# Patient Record
Sex: Male | Born: 1945 | Race: White | Hispanic: No | Smoking: Former smoker
Health system: Southern US, Community
[De-identification: ages and names within clinical notes are randomized; demographics above are authoritative.]

## PROBLEM LIST (undated history)

## (undated) DIAGNOSIS — F32A Depression, unspecified: Secondary | ICD-10-CM

## (undated) DIAGNOSIS — E785 Hyperlipidemia, unspecified: Secondary | ICD-10-CM

## (undated) DIAGNOSIS — K219 Gastro-esophageal reflux disease without esophagitis: Secondary | ICD-10-CM

## (undated) DIAGNOSIS — I10 Essential (primary) hypertension: Secondary | ICD-10-CM

## (undated) DIAGNOSIS — T7840XA Allergy, unspecified, initial encounter: Secondary | ICD-10-CM

## (undated) DIAGNOSIS — I2510 Atherosclerotic heart disease of native coronary artery without angina pectoris: Secondary | ICD-10-CM

## (undated) DIAGNOSIS — F419 Anxiety disorder, unspecified: Secondary | ICD-10-CM

## (undated) DIAGNOSIS — Z87438 Personal history of other diseases of male genital organs: Secondary | ICD-10-CM

## (undated) DIAGNOSIS — I219 Acute myocardial infarction, unspecified: Secondary | ICD-10-CM

## (undated) DIAGNOSIS — F329 Major depressive disorder, single episode, unspecified: Secondary | ICD-10-CM

## (undated) DIAGNOSIS — B356 Tinea cruris: Secondary | ICD-10-CM

## (undated) DIAGNOSIS — R609 Edema, unspecified: Secondary | ICD-10-CM

## (undated) HISTORY — DX: Edema, unspecified: R60.9

## (undated) HISTORY — DX: Atherosclerotic heart disease of native coronary artery without angina pectoris: I25.10

## (undated) HISTORY — PX: TONSILLECTOMY AND ADENOIDECTOMY: SUR1326

## (undated) HISTORY — DX: Depression, unspecified: F32.A

## (undated) HISTORY — DX: Gastro-esophageal reflux disease without esophagitis: K21.9

## (undated) HISTORY — DX: Allergy, unspecified, initial encounter: T78.40XA

## (undated) HISTORY — DX: Hyperlipidemia, unspecified: E78.5

## (undated) HISTORY — PX: CORONARY ANGIOPLASTY WITH STENT PLACEMENT: SHX49

## (undated) HISTORY — DX: Essential (primary) hypertension: I10

## (undated) HISTORY — DX: Tinea cruris: B35.6

## (undated) HISTORY — DX: Personal history of other diseases of male genital organs: Z87.438

## (undated) HISTORY — DX: Major depressive disorder, single episode, unspecified: F32.9

## (undated) HISTORY — DX: Anxiety disorder, unspecified: F41.9

---

## 2001-04-07 ENCOUNTER — Other Ambulatory Visit (HOSPITAL_COMMUNITY): Admission: RE | Admit: 2001-04-07 | Discharge: 2001-04-19 | Payer: Self-pay | Admitting: Psychiatry

## 2004-04-14 ENCOUNTER — Ambulatory Visit: Payer: Self-pay | Admitting: Psychiatry

## 2004-04-14 ENCOUNTER — Other Ambulatory Visit (HOSPITAL_COMMUNITY): Admission: RE | Admit: 2004-04-14 | Discharge: 2004-04-22 | Payer: Self-pay | Admitting: Psychiatry

## 2004-05-29 ENCOUNTER — Ambulatory Visit: Payer: Self-pay | Admitting: Internal Medicine

## 2004-10-21 ENCOUNTER — Ambulatory Visit: Payer: Self-pay | Admitting: Internal Medicine

## 2005-01-23 ENCOUNTER — Ambulatory Visit: Payer: Self-pay | Admitting: Internal Medicine

## 2005-05-06 ENCOUNTER — Inpatient Hospital Stay (HOSPITAL_COMMUNITY): Admission: EM | Admit: 2005-05-06 | Discharge: 2005-05-08 | Payer: Self-pay | Admitting: Emergency Medicine

## 2005-05-07 ENCOUNTER — Ambulatory Visit: Payer: Self-pay | Admitting: Internal Medicine

## 2005-05-07 HISTORY — PX: CARDIAC CATHETERIZATION: SHX172

## 2005-06-02 ENCOUNTER — Ambulatory Visit: Payer: Self-pay | Admitting: Internal Medicine

## 2005-06-04 ENCOUNTER — Ambulatory Visit: Payer: Self-pay | Admitting: Internal Medicine

## 2005-06-11 ENCOUNTER — Ambulatory Visit: Payer: Self-pay | Admitting: Internal Medicine

## 2005-06-16 ENCOUNTER — Ambulatory Visit: Payer: Self-pay | Admitting: Internal Medicine

## 2005-06-18 ENCOUNTER — Ambulatory Visit: Payer: Self-pay | Admitting: Cardiology

## 2005-06-19 ENCOUNTER — Ambulatory Visit: Payer: Self-pay | Admitting: Internal Medicine

## 2006-02-22 ENCOUNTER — Ambulatory Visit: Payer: Self-pay | Admitting: Internal Medicine

## 2006-02-24 ENCOUNTER — Ambulatory Visit: Payer: Self-pay | Admitting: Internal Medicine

## 2006-10-19 ENCOUNTER — Ambulatory Visit: Payer: Self-pay | Admitting: Internal Medicine

## 2006-10-19 LAB — CONVERTED CEMR LAB
ALT: 28 units/L (ref 0–40)
AST: 28 units/L (ref 0–37)
Albumin: 3.7 g/dL (ref 3.5–5.2)
Alkaline Phosphatase: 52 units/L (ref 39–117)
Bilirubin, Direct: 0.1 mg/dL (ref 0.0–0.3)
Cholesterol: 167 mg/dL (ref 0–200)
HDL: 52.9 mg/dL (ref >39.0)
LDL Cholesterol: 85 mg/dL (ref 0–99)
Total Bilirubin: 0.7 mg/dL (ref 0.3–1.2)
Total CHOL/HDL Ratio: 3.2
Total Protein: 6.6 g/dL (ref 6.0–8.3)
Triglycerides: 148 mg/dL (ref 0–149)
VLDL: 30 mg/dL (ref 0–40)

## 2006-11-09 ENCOUNTER — Ambulatory Visit: Payer: Self-pay | Admitting: Internal Medicine

## 2007-03-29 ENCOUNTER — Encounter: Payer: Self-pay | Admitting: *Deleted

## 2007-03-29 DIAGNOSIS — T7840XA Allergy, unspecified, initial encounter: Secondary | ICD-10-CM | POA: Insufficient documentation

## 2007-03-29 DIAGNOSIS — I10 Essential (primary) hypertension: Secondary | ICD-10-CM | POA: Insufficient documentation

## 2007-03-29 DIAGNOSIS — F341 Dysthymic disorder: Secondary | ICD-10-CM | POA: Insufficient documentation

## 2007-03-29 DIAGNOSIS — Z87448 Personal history of other diseases of urinary system: Secondary | ICD-10-CM

## 2007-03-29 DIAGNOSIS — K219 Gastro-esophageal reflux disease without esophagitis: Secondary | ICD-10-CM | POA: Insufficient documentation

## 2007-03-29 DIAGNOSIS — Z9089 Acquired absence of other organs: Secondary | ICD-10-CM

## 2007-03-29 DIAGNOSIS — E785 Hyperlipidemia, unspecified: Secondary | ICD-10-CM

## 2007-05-25 ENCOUNTER — Telehealth: Payer: Self-pay | Admitting: Internal Medicine

## 2007-08-31 ENCOUNTER — Telehealth: Payer: Self-pay | Admitting: Internal Medicine

## 2007-10-03 ENCOUNTER — Encounter: Payer: Self-pay | Admitting: Internal Medicine

## 2007-10-06 ENCOUNTER — Ambulatory Visit: Payer: Self-pay | Admitting: Internal Medicine

## 2007-10-06 DIAGNOSIS — I2510 Atherosclerotic heart disease of native coronary artery without angina pectoris: Secondary | ICD-10-CM

## 2007-10-06 DIAGNOSIS — Z9861 Coronary angioplasty status: Secondary | ICD-10-CM

## 2007-12-05 ENCOUNTER — Telehealth: Payer: Self-pay | Admitting: Internal Medicine

## 2007-12-27 ENCOUNTER — Encounter: Payer: Self-pay | Admitting: Internal Medicine

## 2007-12-28 ENCOUNTER — Ambulatory Visit: Payer: Self-pay | Admitting: Internal Medicine

## 2007-12-28 DIAGNOSIS — B356 Tinea cruris: Secondary | ICD-10-CM | POA: Insufficient documentation

## 2008-08-10 ENCOUNTER — Ambulatory Visit: Payer: Self-pay | Admitting: Endocrinology

## 2008-08-10 DIAGNOSIS — R609 Edema, unspecified: Secondary | ICD-10-CM

## 2008-08-13 LAB — CONVERTED CEMR LAB
BUN: 11 mg/dL (ref 6–23)
CO2: 27 meq/L (ref 19–32)
Calcium: 9.1 mg/dL (ref 8.4–10.5)
Chloride: 100 meq/L (ref 96–112)
Creatinine, Ser: 1 mg/dL (ref 0.4–1.5)
GFR calc Af Amer: 98 mL/min
GFR calc non Af Amer: 81 mL/min
Glucose, Bld: 106 mg/dL — ABNORMAL HIGH (ref 70–99)
Potassium: 3.9 meq/L (ref 3.5–5.1)
Pro B Natriuretic peptide (BNP): 13 pg/mL (ref 0.0–100.0)
Sodium: 135 meq/L (ref 135–145)
TSH: 2.48 microintl units/mL (ref 0.35–5.50)

## 2008-08-16 ENCOUNTER — Telehealth: Payer: Self-pay | Admitting: Internal Medicine

## 2008-09-04 ENCOUNTER — Ambulatory Visit: Payer: Self-pay | Admitting: Internal Medicine

## 2008-09-04 LAB — CONVERTED CEMR LAB
ALT: 20 units/L (ref 0–53)
AST: 19 units/L (ref 0–37)
Albumin: 3.7 g/dL (ref 3.5–5.2)
Alkaline Phosphatase: 51 units/L (ref 39–117)
BUN: 13 mg/dL (ref 6–23)
Basophils Absolute: 0 10*3/uL (ref 0.0–0.1)
Basophils Relative: 0.4 % (ref 0.0–3.0)
Bilirubin Urine: NEGATIVE
Bilirubin, Direct: 0.1 mg/dL (ref 0.0–0.3)
CO2: 29 meq/L (ref 19–32)
Calcium: 9 mg/dL (ref 8.4–10.5)
Chloride: 99 meq/L (ref 96–112)
Cholesterol: 238 mg/dL (ref 0–200)
Creatinine, Ser: 1 mg/dL (ref 0.4–1.5)
Direct LDL: 147.1 mg/dL
Eosinophils Absolute: 0 10*3/uL (ref 0.0–0.7)
Eosinophils Relative: 0 % (ref 0.0–5.0)
GFR calc Af Amer: 98 mL/min
GFR calc non Af Amer: 81 mL/min
Glucose, Bld: 112 mg/dL — ABNORMAL HIGH (ref 70–99)
HCT: 37.2 % — ABNORMAL LOW (ref 39.0–52.0)
HDL: 48.6 mg/dL (ref >39.0)
Hemoglobin, Urine: NEGATIVE
Hemoglobin: 12.9 g/dL — ABNORMAL LOW (ref 13.0–17.0)
Ketones, ur: NEGATIVE mg/dL
Leukocytes, UA: NEGATIVE
Lymphocytes Relative: 20.5 % (ref 12.0–46.0)
MCHC: 34.6 g/dL (ref 30.0–36.0)
MCV: 90.6 fL (ref 78.0–100.0)
Monocytes Absolute: 0.4 10*3/uL (ref 0.1–1.0)
Monocytes Relative: 9.7 % (ref 3.0–12.0)
Neutro Abs: 2.9 10*3/uL (ref 1.4–7.7)
Neutrophils Relative %: 69.4 % (ref 43.0–77.0)
Nitrite: NEGATIVE
PSA: 1.42 ng/mL (ref 0.10–4.00)
Platelets: 198 10*3/uL (ref 150–400)
Potassium: 3.8 meq/L (ref 3.5–5.1)
RBC: 4.11 M/uL — ABNORMAL LOW (ref 4.22–5.81)
RDW: 13.3 % (ref 11.5–14.6)
Sodium: 137 meq/L (ref 135–145)
Specific Gravity, Urine: 1.015 (ref 1.000–1.035)
TSH: 1.89 microintl units/mL (ref 0.35–5.50)
Total Bilirubin: 1 mg/dL (ref 0.3–1.2)
Total CHOL/HDL Ratio: 4.9
Total Protein, Urine: NEGATIVE mg/dL
Total Protein: 6.3 g/dL (ref 6.0–8.3)
Triglycerides: 249 mg/dL (ref 0–149)
Urine Glucose: NEGATIVE mg/dL
Urobilinogen, UA: 0.2 (ref 0.0–1.0)
VLDL: 50 mg/dL — ABNORMAL HIGH (ref 0–40)
WBC: 4.1 10*3/uL — ABNORMAL LOW (ref 4.5–10.5)
pH: 6 (ref 5.0–8.0)

## 2008-09-08 ENCOUNTER — Encounter: Payer: Self-pay | Admitting: Internal Medicine

## 2008-09-27 ENCOUNTER — Telehealth: Payer: Self-pay | Admitting: Internal Medicine

## 2008-11-30 ENCOUNTER — Encounter: Payer: Self-pay | Admitting: Internal Medicine

## 2009-01-04 ENCOUNTER — Telehealth (INDEPENDENT_AMBULATORY_CARE_PROVIDER_SITE_OTHER): Payer: Self-pay | Admitting: *Deleted

## 2009-01-10 ENCOUNTER — Encounter: Payer: Self-pay | Admitting: Internal Medicine

## 2009-01-11 ENCOUNTER — Encounter: Payer: Self-pay | Admitting: Internal Medicine

## 2009-03-21 ENCOUNTER — Telehealth: Payer: Self-pay | Admitting: Internal Medicine

## 2009-07-24 ENCOUNTER — Telehealth (INDEPENDENT_AMBULATORY_CARE_PROVIDER_SITE_OTHER): Payer: Self-pay | Admitting: *Deleted

## 2009-08-15 ENCOUNTER — Telehealth: Payer: Self-pay | Admitting: Internal Medicine

## 2009-09-26 ENCOUNTER — Ambulatory Visit: Payer: Self-pay | Admitting: Internal Medicine

## 2009-09-26 LAB — CONVERTED CEMR LAB
ALT: 41 units/L (ref 0–53)
AST: 28 units/L (ref 0–37)
Albumin: 3.6 g/dL (ref 3.5–5.2)
Alkaline Phosphatase: 49 units/L (ref 39–117)
BUN: 7 mg/dL (ref 6–23)
Basophils Absolute: 0 10*3/uL (ref 0.0–0.1)
Basophils Relative: 0.5 % (ref 0.0–3.0)
Bilirubin Urine: NEGATIVE
Bilirubin, Direct: 0.1 mg/dL (ref 0.0–0.3)
CO2: 33 meq/L — ABNORMAL HIGH (ref 19–32)
Calcium: 8.9 mg/dL (ref 8.4–10.5)
Chloride: 100 meq/L (ref 96–112)
Cholesterol: 130 mg/dL (ref 0–200)
Creatinine, Ser: 0.9 mg/dL (ref 0.4–1.5)
Eosinophils Absolute: 0.1 10*3/uL (ref 0.0–0.7)
Eosinophils Relative: 1 % (ref 0.0–5.0)
GFR calc non Af Amer: 90.58 mL/min (ref >60)
Glucose, Bld: 94 mg/dL (ref 70–99)
HCT: 38.5 % — ABNORMAL LOW (ref 39.0–52.0)
HDL: 50.1 mg/dL (ref >39.00)
Hemoglobin, Urine: NEGATIVE
Hemoglobin: 12.7 g/dL — ABNORMAL LOW (ref 13.0–17.0)
Ketones, ur: NEGATIVE mg/dL
LDL Cholesterol: 53 mg/dL (ref 0–99)
Leukocytes, UA: NEGATIVE
Lymphocytes Relative: 20.7 % (ref 12.0–46.0)
Lymphs Abs: 1.1 10*3/uL (ref 0.7–4.0)
MCHC: 33 g/dL (ref 30.0–36.0)
MCV: 96.1 fL (ref 78.0–100.0)
Monocytes Absolute: 0.4 10*3/uL (ref 0.1–1.0)
Monocytes Relative: 6.6 % (ref 3.0–12.0)
Neutro Abs: 3.8 10*3/uL (ref 1.4–7.7)
Neutrophils Relative %: 71.2 % (ref 43.0–77.0)
Nitrite: NEGATIVE
PSA: 3.58 ng/mL (ref 0.10–4.00)
Platelets: 217 10*3/uL (ref 150.0–400.0)
Potassium: 4.3 meq/L (ref 3.5–5.1)
RBC: 4 M/uL — ABNORMAL LOW (ref 4.22–5.81)
RDW: 13.7 % (ref 11.5–14.6)
Sodium: 139 meq/L (ref 135–145)
Specific Gravity, Urine: 1.015 (ref 1.000–1.030)
TSH: 2.71 microintl units/mL (ref 0.35–5.50)
Total Bilirubin: 0.4 mg/dL (ref 0.3–1.2)
Total CHOL/HDL Ratio: 3
Total Protein, Urine: NEGATIVE mg/dL
Total Protein: 5.9 g/dL — ABNORMAL LOW (ref 6.0–8.3)
Triglycerides: 134 mg/dL (ref 0.0–149.0)
Urine Glucose: NEGATIVE mg/dL
Urobilinogen, UA: 0.2 (ref 0.0–1.0)
VLDL: 26.8 mg/dL (ref 0.0–40.0)
WBC: 5.4 10*3/uL (ref 4.5–10.5)
pH: 7 (ref 5.0–8.0)

## 2009-09-27 ENCOUNTER — Ambulatory Visit: Payer: Self-pay | Admitting: Internal Medicine

## 2010-02-26 ENCOUNTER — Telehealth (INDEPENDENT_AMBULATORY_CARE_PROVIDER_SITE_OTHER): Payer: Self-pay | Admitting: *Deleted

## 2010-02-26 ENCOUNTER — Encounter: Payer: Self-pay | Admitting: Internal Medicine

## 2010-07-31 NOTE — Assessment & Plan Note (Signed)
Summary: CPX-LB   Vital Signs:  Patient profile:   65 year old male Height:      65 inches Weight:      239 pounds BMI:     39.92 O2 Sat:      96 % on Room air Temp:     97.3 degrees F oral Pulse rate:   93 / minute BP sitting:   144 / 96  (left arm) Cuff size:   large  Vitals Entered By: Bill Salinas CMA (September 27, 2009 1:58 PM)  O2 Flow:  Room air CC: pt here for yearly / pt states he is due for an eye exam / ab   Primary Care Provider:  Matthias Bogus  CC:  pt here for yearly / pt states he is due for an eye exam / ab.  History of Present Illness: Patient presents for routine medical follow-up. He has been feeling well.   He has had a hard time psychologically: very stressed about employment; his ex-wife got a large portion of his pension with exacerbation by stock market fall made his financial status worse. He also feels that he did not do as well  re: negotiations of his settlement due to depression. He has been followed by Dr. Donell Beers who was treating with desipramine. He was started on abilify after desipramine level came back as low (February) in addition to his other psychotropic meds.  He has a problem with flushing, usually after alcohol consumption and it happens spontaneously. He has no associated symptoms with the flushing.  Peripheral edema - waxes and wanes. He is not wearing support hose. Reviewed, again, the mechanism of venous insufficiency.   Obesity - hasn't lost weight. Interested in Bariatric surgery - referred to informational session at [facility].     Current Medications (verified): 1)  Xanax Xr 3 Mg  Tb24 (Alprazolam) .... Take One Tablet Once Daily 2)  Desipramine Hcl 25 Mg Tabs (Desipramine Hcl) .... Take 3 Tablet By Mouth Once A Day 3)  Avodart 0.5 Mg  Caps (Dutasteride) .... Take One Tablet Once Daily 4)  Flomax 0.4 Mg  Cp24 (Tamsulosin Hcl) .Marland Kitchen.. 1 Two Times A Day 5)  Lamictal 100 Mg  Tabs (Lamotrigine) .... Take One Tablet Twice Daily 6)  Plavix 75 Mg  Tabs  (Clopidogrel Bisulfate) .... Take One Tablet Once Daily 7)  Xanax 0.5 Mg  Tabs (Alprazolam) .... Take If Needed 8)  Simvastatin 80 Mg Tabs (Simvastatin) .Marland Kitchen.. 1 By Mouth Qpm 9)  Nitroquick 0.4 Mg  Subl (Nitroglycerin) .... As Needed 10)  Levitra 20 Mg Tabs (Vardenafil Hcl) .... Take 1 Tablet By Mouth Once A Day 11)  Omeprazole 40 Mg Cpdr (Omeprazole) .Marland Kitchen.. 1 Qam / Stop Nexium 12)  Lisinopril 10 Mg Tabs (Lisinopril) .Marland Kitchen.. 1 By Mouth Once Daily 13)  Furosemide 20 Mg Tabs (Furosemide) .... Qd 14)  Atenolol 50 Mg Tabs (Atenolol) .Marland Kitchen.. 1 By Mouth Once Daily Bp and Rate Control 15)  Zetia 10 Mg Tabs (Ezetimibe) .Marland Kitchen.. 1 By Mouth Once Daily  Allergies (verified): 1)  ! 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) CORONARY ARTERY DISEASE (ICD-414.00) ADENOIDECTOMY, HX OF (ICD-V45.79) TONSILLECTOMY, HX OF (ICD-V45.79) GASTROESOPHAGEAL REFLUX DISEASE (ICD-530.81) HYPERLIPIDEMIA (ICD-272.4) ALLERGY (ICD-995.3) PROSTATITIS, HX OF (ICD-V13.09) BENIGN PROSTATIC HYPERTROPHY, HX OF (ICD-V13.8) HYPERTENSION (ICD-401.9) DEPRESSION/ANXIETY (ICD-300.4)  Coronary artery disease-cath '06, repeat '08 non-obstructive disease  Past Surgical History: Last updated: 03/29/2007 ADENOIDECTOMY, HX OF (ICD-V45.79)  TONSILLECTOMY, HX OF (ICD-V45.79)  Family History: Last updated: 26-Sep-2008 father - deceased @ 47: CAD/MI mother - deceased @ 28: CVA Neg- colon or prostate cancer, DM  Social History: Last updated: September 26, 2008 retired [employer]; remains professionaly active married 35 years - divorce-- in process (3/10), lives alone. Children - son  Risk Factors: Alcohol Use: 1 (12/28/2007) Exercise: no (12/28/2007)  Risk Factors: Smoking Status: quit (03/29/2007) Passive Smoke Exposure: no (12/28/2007)  Review of Systems       The patient complains of peripheral edema and  depression.  The patient denies anorexia, fever, weight loss, weight gain, vision loss, decreased hearing, chest pain, dyspnea on exertion, prolonged cough, headaches, abdominal pain, severe indigestion/heartburn, incontinence, muscle weakness, suspicious skin lesions, difficulty walking, unusual weight change, abnormal bleeding, and angioedema.    Physical Exam  General:  overweight white male in no distress Head:  Normocephalic and atraumatic without obvious abnormalities. No apparent alopecia or balding. Eyes:  No corneal or conjunctival inflammation noted. EOMI. Perrla. Funduscopic exam benign, without hemorrhages, exudates or papilledema. Vision grossly normal. Ears:  External ear exam shows no significant lesions or deformities.  Otoscopic examination reveals clear canals, tympanic membranes are intact bilaterally without bulging, retraction, inflammation or discharge. Hearing is grossly normal bilaterally. Mouth:  Oral mucosa and oropharynx without lesions or exudates.  Teeth in good repair. Neck:  supple, full ROM, no thyromegaly, and no carotid bruits.   Chest Wall:  no deformities and no tenderness.   Breasts:  No masses or gynecomastia noted Lungs:  Normal respiratory effort, chest expands symmetrically. Lungs are clear to auscultation, no crackles or wheezes. Heart:  Normal rate and regular rhythm. S1 and S2 normal without gallop, murmur, click, rub or other extra sounds. Abdomen:  soft, non-tender, normal bowel sounds, no guarding, no rigidity, no inguinal hernia, and no hepatomegaly.   Prostate:  deferred to GU Msk:  normal ROM, no joint tenderness, no joint swelling, no joint deformities, and no joint instability.   Extremities:  1+ pitting edema LE and general swelling. Neurologic:  alert & oriented X3, cranial nerves II-XII intact, strength normal in all extremities, gait normal, and DTRs symmetrical and normal.   Skin:  turgor normal, color normal, no rashes, no suspicious  lesions, and no petechiae.   Cervical Nodes:  no anterior cervical adenopathy and no posterior cervical adenopathy.   Psych:  Oriented X3, memory intact for recent and remote, normally interactive, good eye contact, and not anxious appearing.     Impression & Recommendations:  Problem # 1:  OBESITY, CLASS III (ICD-278.01) This is a major health risk for him. We discussed potential bariatric surgery.  Plan - He is advised to attend an informational class on bariatric surgery at [facility]. He would be medically able to undergo surgery  Problem # 2:  EDEMA (ICD-782.3) Reviewed the mechanism of venous insufficiency.  Plan - use of knee high support hose and elevation of legs during the day.   His updated medication list for this problem includes:    Furosemide 20 Mg Tabs (Furosemide) ..... Once daily for bp mgt  Problem # 3:  CORONARY ARTERY DISEASE (ICD-414.00) Stable with no complaints of angina or other cardiac symptoms. He did have a cardiac cath in [place] with no obstructive disease but no report is on record.  Plan - continued risk reduction through life-style and medical management  His updated medication list for this problem includes:    Plavix 75 Mg Tabs (Clopidogrel bisulfate) .Marland KitchenMarland KitchenMarland KitchenMarland Kitchen  Take one tablet once daily for circulation    Nitroquick 0.4 Mg Subl (Nitroglycerin) .Marland Kitchen... As needed    Lisinopril 10 Mg Tabs (Lisinopril) .Marland Kitchen... 1 by mouth once daily for bp    Furosemide 20 Mg Tabs (Furosemide) ..... Once daily for bp mgt    Atenolol 100 Mg Tabs (Atenolol) .Marland Kitchen... 1 by mouth once daily for bp mgt and heart rate control  Problem # 4:  HYPERLIPIDEMIA (ICD-272.4) Excellent control with LDL 53 on current regimen.  Plan - continue present medications.  His updated medication list for this problem includes:    Simvastatin 80 Mg Tabs (Simvastatin) .Marland Kitchen... 1 by mouth qpm for cholesterol mgt    Zetia 10 Mg Tabs (Ezetimibe) .Marland Kitchen... 1 by mouth once daily for cholesterol mgt.  Problem #  5:  GASTROESOPHAGEAL REFLUX DISEASE (ICD-530.81) Stable on omperazole.  His updated medication list for this problem includes:    Omeprazole 40 Mg Cpdr (Omeprazole) .Marland Kitchen... 1 qam / stop nexium for acid stomach  Problem # 6:  HYPERTENSION (ICD-401.9)  His updated medication list for this problem includes:    Lisinopril 10 Mg Tabs (Lisinopril) .Marland Kitchen... 1 by mouth once daily for bp    Furosemide 20 Mg Tabs (Furosemide) ..... Once daily for bp mgt    Atenolol 100 Mg Tabs (Atenolol) .Marland Kitchen... 1 by mouth once daily for bp mgt and heart rate control  BP today: 144/96 Prior BP: 106/74 (09/04/2008)  BP a bit elevated today but previously well controlled.  Plan - no change in medication           out of office monitoring with report back. Adjustments to be made based on information relayed back.   Problem # 7:  DEPRESSION/ANXIETY (ICD-300.4) Complex patient on many medications. He will follow-up with Dr. Donell Beers. By his report he doing well at this time on his present medical regimen.  Problem # 8:  Preventive Health Care (ICD-V70.0) Exam remarkable for peripheral edema and weight. No colonoscopy record on EMR - will check his paper chart and schedule if due. Lab report is unremarkable.   In summary- a nice gentleman who needs to get his obesity under control. He will investigate the possibilty of bariatric surgery - which is medically indicated given his metabolic syndrome with associated risk.   Complete Medication List: 1)  Xanax Xr 3 Mg Tb24 (Alprazolam) .... Take one tablet once daily 2)  Desipramine Hcl 25 Mg Tabs (Desipramine hcl) .... Take 3 tablet by mouth once a day 3)  Avodart 0.5 Mg Caps (Dutasteride) .... Take one tablet once daily 4)  Flomax 0.4 Mg Cp24 (Tamsulosin hcl) .Marland Kitchen.. 1 two times a day 5)  Lamictal 100 Mg Tabs (Lamotrigine) .... Take one tablet twice daily 6)  Plavix 75 Mg Tabs (Clopidogrel bisulfate) .... Take one tablet once daily for circulation 7)  Xanax 0.5 Mg Tabs  (Alprazolam) .... Take if needed 8)  Simvastatin 80 Mg Tabs (Simvastatin) .Marland Kitchen.. 1 by mouth qpm for cholesterol mgt 9)  Nitroquick 0.4 Mg Subl (Nitroglycerin) .... As needed 10)  Levitra 20 Mg Tabs (Vardenafil hcl) .... Take 1 tablet by mouth once a day for ed 11)  Omeprazole 40 Mg Cpdr (Omeprazole) .Marland Kitchen.. 1 qam / stop nexium for acid stomach 12)  Lisinopril 10 Mg Tabs (Lisinopril) .Marland Kitchen.. 1 by mouth once daily for bp 13)  Furosemide 20 Mg Tabs (Furosemide) .... Once daily for bp mgt 14)  Atenolol 100 Mg Tabs (Atenolol) .Marland Kitchen.. 1 by mouth once daily for bp  mgt and heart rate control 15)  Zetia 10 Mg Tabs (Ezetimibe) .Marland Kitchen.. 1 by mouth once daily for cholesterol mgt.  Other Orders: TD Toxoids IM 7 YR + ([address]) Admin 1st Vaccine (60454)  Patient: [name] [name] Note: All result statuses are Final unless otherwise noted.  Tests: (1) Lipid Panel (LIPID)   Cholesterol               130 mg/dL                   0-981     ATP III Classification            Desirable:  < 200 mg/dL                    Borderline High:  200 - 239 mg/dL               High:  > = 240 mg/dL   Triglycerides             134.0 mg/dL                 1.9-147.8     Normal:  <150 mg/dL     Borderline High:  295 - 199 mg/dL   HDL                       62.13 mg/dL                 >08.65   VLDL Cholesterol          26.8 mg/dL                  7.8-46.9   LDL Cholesterol           53 mg/dL                    6-29  CHO/HDL Ratio:  CHD Risk                             3                    Men          Women     1/2 Average Risk     3.4          3.3     Average Risk          5.0          4.4     2X Average Risk          9.6          7.1     3X Average Risk          15.0          11.0                           Tests: (2) BMP (METABOL)   Sodium                    139 mEq/L                   135-145   Potassium                 4.3 mEq/L  3.5-5.1   Chloride                  100 mEq/L                   96-112   Carbon Dioxide        [H]  33 mEq/L                    19-32   Glucose                   94 mg/dL                    16-10   BUN                       7 mg/dL                     9-60   Creatinine                0.9 mg/dL                   4.5-4.0   Calcium                   8.9 mg/dL                   9.8-11.9   GFR                       90.58 mL/min                >60  Tests: (3) CBC Platelet w/Diff (CBCD)   White Cell Count          5.4 K/uL                    4.5-10.5   Red Cell Count       [L]  4.00 Mil/uL                 4.22-5.81   Hemoglobin           [L]  12.7 g/dL                   14.7-82.9   Hematocrit           [L]  38.5 %                      39.0-52.0   MCV                       96.1 fl                     78.0-100.0   MCHC                      33.0 g/dL                   56.2-13.0   RDW                       13.7 %                      11.5-14.6   Platelet Count            217.0  K/uL                  150.0-400.0   Neutrophil %              71.2 %                      43.0-77.0   Lymphocyte %              20.7 %                      12.0-46.0   Monocyte %                6.6 %                       3.0-12.0   Eosinophils%              1.0 %                       0.0-5.0   Basophils %               0.5 %                       0.0-3.0   Neutrophill Absolute      3.8 K/uL                    1.4-7.7   Lymphocyte Absolute       1.1 K/uL                    0.7-4.0   Monocyte Absolute         0.4 K/uL                    0.1-1.0  Eosinophils, Absolute                             0.1 K/uL                    0.0-0.7   Basophils Absolute        0.0 K/uL                    0.0-0.1  Tests: (4) Hepatic/Liver Function Panel (HEPATIC)   Total Bilirubin           0.4 mg/dL                   1.6-1.0   Direct Bilirubin          0.1 mg/dL                   9.6-0.4   Alkaline Phosphatase      49 U/L                      39-117   AST                       28 U/L                      0-37   ALT                        41 U/L  0-53   Total Protein        [L]  5.9 g/dL                    1.6-1.0   Albumin                   3.6 g/dL                    9.6-0.4  Tests: (5) TSH (TSH)   FastTSH                   2.71 uIU/mL                 0.35-5.50  Tests: (6) UDip Only (UDIP)   Color                     YELLOW       RANGE:  Yellow;Lt. Yellow   Clarity                   CLEAR                       Clear   Specific Gravity          1.015                       1.000 - 1.030   Urine Ph                  7.0                         5.0-8.0   Protein                   NEGATIVE                    Negative   Urine Glucose             NEGATIVE                    Negative   Ketones                   NEGATIVE                    Negative   Urine Bilirubin           NEGATIVE                    Negative   Blood                     NEGATIVE                    Negative   Urobilinogen              0.2                         0.0 - 1.0   Leukocyte Esterace        NEGATIVE                    Negative   Nitrite                   NEGATIVE  Negative  Tests: (7) Prostate Specific Antigen (PSA)   PSA-Hyb                   3.58 ng/mL                  0.10-4.00Prescriptions: ZETIA 10 MG TABS (EZETIMIBE) 1 by mouth once daily for cholesterol mgt.  #30 x 12   Entered and Authorized by:   Jacques Navy MD   Signed by:   Jacques Navy MD on 09/27/2009   Method used:   Electronically to        [facility]  [phone]* (retail)       [address] [place], [place]  [address]       Ph: [phone] or [phone]       Fax: [phone]   RxID:   [id number] ATENOLOL 100 MG TABS (ATENOLOL) 1 by mouth once daily for BP mgt and heart rate control  #30 x 12   Entered and Authorized by:   Jacques Navy MD   Signed by:   Jacques Navy MD on 09/27/2009   Method used:   Electronically to        [facility]  [phone]* (retail)       [address] [place], [place]  [address]       Ph: [phone] or [phone]       Fax: [phone]   RxID:   [id number] FUROSEMIDE 20 MG TABS (FUROSEMIDE) once daily for BP mgt  #30 x 12   Entered and Authorized by:   Jacques Navy MD   Signed by:   Jacques Navy MD on 09/27/2009   Method used:   Electronically to        [facility]  [phone]* (retail)       [address] [place], [place]  [address]       Ph: [phone] or [phone]       Fax: [phone]   RxID:   [id number] LISINOPRIL 10 MG TABS (LISINOPRIL) 1 by mouth once daily for BP  #30 x 12   Entered and Authorized by:   Jacques Navy MD   Signed by:   Jacques Navy MD on 09/27/2009   Method used:   Electronically to        [facility]  [phone]* (retail)       [address] [place], [place]  [address]       Ph: [phone] or [phone]       Fax: [phone]   RxID:   [id number] OMEPRAZOLE 40 MG CPDR (OMEPRAZOLE) 1 qam / Stop Nexium for acid stomach  #30 x 12   Entered and Authorized by:   Jacques Navy MD   Signed by:   Jacques Navy MD on 09/27/2009   Method used:   Electronically to        [facility]  [phone]* (retail)       [address] [place], [place]  [address]       Ph: [phone] or [phone]       Fax: [phone]   RxID:   [id number] LEVITRA 20 MG TABS (VARDENAFIL HCL) Take 1 tablet by mouth once a day for ED  #6 x 12  Entered and Authorized by:   Jacques Navy MD   Signed by:   Jacques Navy MD on 09/27/2009   Method used:   Electronically to        [facility]  [phone]* (retail)       [address] [place], [place]  [address]       Ph: [phone] or [phone]       Fax: [phone]   RxID:   [id number] NITROQUICK 0.4 MG  SUBL (NITROGLYCERIN) as needed  #20 x 3   Entered and Authorized by:   Jacques Navy MD   Signed by:   Jacques Navy MD on 09/27/2009   Method used:   Electronically to         [facility]  [phone]* (retail)       [address] [place], [place]  [address]       Ph: [phone] or [phone]       Fax: [phone]   RxID:   [id number] SIMVASTATIN 80 MG TABS (SIMVASTATIN) 1 by mouth qpm for cholesterol mgt  #30 x 12   Entered and Authorized by:   Jacques Navy MD   Signed by:   Jacques Navy MD on 09/27/2009   Method used:   Electronically to        [facility]  [phone]* (retail)       [address] [place], [place]  [address]       Ph: [phone] or [phone]       Fax: [phone]   RxID:   [id number] PLAVIX 75 MG  TABS (CLOPIDOGREL BISULFATE) Take one tablet once daily for circulation  #30 x 12   Entered and Authorized by:   Jacques Navy MD   Signed by:   Jacques Navy MD on 09/27/2009   Method used:   Electronically to        [facility]  [phone]* (retail)       [address] [place], [place]  [address]       Ph: [phone] or [phone]       Fax: [phone]   RxID:   [id number]    Preventive Care Screening  Last Flu Shot:    Date:  03/30/2009    Results:  given     Immunizations Administered:  Tetanus Vaccine:    Vaccine Type: Td    Site: left deltoid    Mfr: Sanofi Pasteur    Dose: 0.5 ml    Route: IM    Given by: Ami Bullins CMA    Exp. Date: 05/14/2011    Lot #: [id number]    VIS given: 05/17/07 version given September 27, 2009.

## 2010-07-31 NOTE — Progress Notes (Signed)
Summary: Nabumetone  Phone Note Call from Patient Call back at [facility] Phone [phone]   Summary of Call: Pt took anti-inflamatory, nabumetone 500mg . Pharmacist told pt that it was safe to take with his other meds. He read info about medication and is worried that there may be some type of interaction. He wants Dr Debby Bud opinion.  Initial call taken by: Lamar Sprinkles, CMA,  August 15, 2009 5:04 PM  Follow-up for Phone Call        called pt - reviewed risks & benefits of NSAIDS. he is taking this for acute back pain. Ok for short-term use. Follow-up by: Jacques Navy MD,  August 15, 2009 6:05 PM

## 2010-07-31 NOTE — Progress Notes (Signed)
Summary: NEXIUM  Phone Note Call from Patient Call back at Home Phone [phone]   Summary of Call: Patient left message on triage that he needs prescription for generic of Nexium due to insurance, I am sure he means prescription for Omeprazole. If pt has taken Omeprazole, I can try and do PA for Nexium.  Patient also would like to have his choleserol testing done. Last CPX appears to have been done 08/2008.  Please advise. Initial call taken by: Lucious Groves,  July 24, 2009 1:06 PM  Follow-up for Phone Call        ok for omeprazole 40mg  by mouth qAM, #90, 3 refills. OK for lipid panel 272.4, hepatic 995.20, metabolic 401.9 Follow-up by: Jacques Navy MD,  July 24, 2009 5:25 PM  Additional Follow-up for Phone Call Additional follow up Details #1::        Pt informed, please put lab in idx, THANK YOU  labs in IDX.....Marland KitchenMarland KitchenVerdell Face Additional Follow-up by: Lamar Sprinkles, CMA,  July 25, 2009 6:03 PM    New/Updated Medications: OMEPRAZOLE 40 MG CPDR (OMEPRAZOLE) 1 qam / Stop Nexium Prescriptions: OMEPRAZOLE 40 MG CPDR (OMEPRAZOLE) 1 qam / Stop Nexium  #90 x 3   Entered by:   Lamar Sprinkles, CMA   Authorized by:   Jacques Navy MD   Signed by:   Lamar Sprinkles, CMA on 07/25/2009   Method used:   Electronically to        [facility]  [phone]* (retail)       [address] [place], [place]  [address]       Ph: [phone] or [phone]       Fax: [phone]   RxID:   [id number]

## 2010-07-31 NOTE — Medication Information (Signed)
Summary: Prior autho & approved for Omeprazole/[facility]  Prior autho & approved for Omeprazole/[facility]   Imported By: Sherian Rein 03/05/2010 15:20:47  _____________________________________________________________________  External Attachment:    Type:   Image     Comment:   External Document

## 2010-07-31 NOTE — Progress Notes (Signed)
Summary: PA-Omeprazole   Phone Note From Pharmacy   Summary of Call: PA-Omeprazole, contacted [facility] @ [phone] awaiting form. Dagoberto Reef  February 26, 2010 9:41 AM  PA-Omeprazole faxed to [facility], awaiting approval. Initial call taken by: Dagoberto Reef,  February 26, 2010 11:32 AM  Follow-up for Phone Call        PA-Omeprazole approved 02/05/10-02/26/11, case # [id number]. Follow-up by: Dagoberto Reef,  February 28, 2010 2:50 PM

## 2010-10-29 ENCOUNTER — Other Ambulatory Visit: Payer: Self-pay | Admitting: Internal Medicine

## 2010-11-14 NOTE — Assessment & Plan Note (Signed)
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  His last full physical exam was October 21, 2004.   INTERVAL HISTORY:  Patient was treated for peripheral edema in July 2006  which was thought to be due to venous stasis.  Of note, the patient  developed significant chest pain and was hospitalized for the same May 07, 2005.  He underwent cardiac catheterization for symptoms of unstable  angina.  Patient was found to have intermediate lesion of the proximal LAD.  The diagonal branch was actually larger and had a 40 to 50% proximal  stenosis.  He was brought back a second time to the cath lab for pressure  wire measurement of the LAD.  There was no gradient at rest and no  intervention was required.  The patient had an EF of about 65%.  Patient did  well with this.  Patient was subsequently seen for an incision and drainage  for a large sebaceous cyst at a T4 level of the left upper back.  This  procedure was carried out successfully but there was a wound infection to  follow which required Iodoform packing with eventual 100% resolution.  Patient last saw cardiology June 18, 2005 for followup at which point he  was thought to be stable.  His LDL goal at that time was to be 70 or less.   PAST MEDICAL HISTORY:  1. Usual childhood disease.  2. Zoster at age 65.  3. GERD.  4. Hyperlipidemia.  5. Prostatitis.  6. BPH.  7. Hypertension.  8. Depression with anxiety.  9. History of allergies.   SURGICAL:  Tonsillectomy and adenoidectomy.  No other surgeries.   MEDICATIONS:  1. Toprol XL 100 mg daily.  2. Xanax CR 3 mg q. day.  3. Azipramine 75 mg daily.  4. Avodart 0.5 mg q. day.  5. Flomax  0.4 mg two a bedtime.  6. Lamictal 100 mg b.i.d.  7. Torsemide 20 mg daily.  8. Plavix 75 mg daily.  9. Vytorin 10/40.  10.The patient was given a trial of UroXatral 10 mg daily.   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  The patient was  married and has 3 grown children.  He is presently separated.  He says he  developed an excellent new relationship.  This involves the purchasing of a  farm in [place] where he is actually becoming quite a bit more active  and has made significant dietary changes.  Patient was smoking 1/2 pack per  day and he was not asked if he had resumed his smoking. Patient uses alcohol  occasionally.  ALLERGIES:  Patient has no known drug allergies.   FAMILY HISTORY:  Significant for his father having MI at age 60.  Mother  died at age 68 of a stroke.   CHART REVIEW:  Cardiac cath as noted.   Patient saw Dr. Ihor [name] on December 18, 2005 and has had recent followup  for BPH with slowed stream.  Patient has had lower extremity arterial  Doppler performed September 29, 2002 which showed normal ABIs.   REVIEW OF SYSTEMS:  Patient has had no constitutional, cardiovascular,  respiratory, GI or GU problems.   EXAMINATION:  VITAL SIGNS:  Temperature was 96.7.  Blood pressure 140/87 on  the right.  131/89 on the left. Heart rate was 83.  Weight is 224.  Height 5  foot, 8 inches.  GENERAL:  General appearance a well-nourished, well-developed gentleman in  no acute distress.  HEENT:  Normocephalic.  Atraumatic.  EACs and TMs were unremarkable.  Oropharynx with native dentition in good repair.  No buccal or palatal  lesions were noted.  Posterior pharynx was clear.  Conjunctiva, sclerae was  clear.  PERRLA, EOMI, funduscopic exam was unremarkable  NECK:  Neck was supple without thyromegaly.  Nodes,  no  adenopathy was noted  in the cervical supraclavicular regions.  CHEST:  No CVA tenderness.  LUNGS:  Clear to auscultation and percussion.  CARDIOVASCULAR:  2+ radial pulse.  No JVD or carotid bruits.  He had a quiet  precordium with a regular rate and rhythm without murmurs, rubs or gallops.  ABDOMEN:  Soft.  No guarding or rebound.  He had no organosplenomegaly.  GENITALIA & RECTAL:  Exams referred to Dr. Vernie [name].  EXTREMITIES:  Without clubbing, cyanosis, edema or deformity.  NEUROLOGIC:  Exam was non-focal.   LABORATORY:  February 22, 2006 patient had a hemoglobin of 13.8 grams.  White  count was 8,100 with a normal differential.  Cholesterol was 205.  Triglycerides 91.  HDL was 58.6.  LDL was 136.8 and this was on Lipitor 40  mg daily.  Chemistries reveal a serum glucose of 110. Kidney function normal  with a creatinine 1.0 with an estimated GFR of 81 milliliters per minute.  Liver functions were normal.  Thyroid function normal with a TSH of 1.74.  PSA was normal at 0.85.  Urinalysis was negative.   ASSESSMENT/PLAN:  1. Hypertension.  Patient's blood pressure is borderline control at the      present time.  We will make no change in his medication but encouraged      the patient to continue on a regular exercise program.  2. Cardiovascular.  Patient is stable.  He has had no chest pain or chest      discomfort. He had cardiac catheterization as noted with no true      obstructive disease.  3. Lipid.  The patient's LDL is not at goal.  At this point we will go      from Lipitor 40 to Vytorin 10/40.  Prescription and samples are      provided.  Patient is to return in 4-6 weeks at his convenience for a      followup lipid panel and we will adjust his medications as needed to      obtain goal.  4. Genitourinary.  The patient's BPH is stable.  He is on Flomax as noted.      He was started on UroXatral recently.  The patient will followup with     Dr. Vernie [name]  as needed.  5.  Dermatology.  Patient is doing well at this time.  He has seen and does      see Dr. Terri [name] on an as needed basis.  6. Psychiatric.  Patient is followed by Dr. Archer [name].  On his      present regimen he seems to be stable and doing well.  He also has an      improved home situation as well as an improved professional situation.   SUMMARY:  This is a pleasant gentleman with medical problems as outlined  above.  He does seem medically stable at this time.  He will return for  laboratory as noted.  He will be informed by [facility] as to the result.                                   Rosalyn Gess Norins, MD   MEN/MedQ  DD:  02/26/2006  DT:  02/26/2006  Job #:  [id number]   cc:   Dr. [name]

## 2010-11-14 NOTE — Cardiovascular Report (Signed)
[name], ALDERMAN NO.:  [id number]   MEDICAL RECORD NO.:  [id number]          PATIENT TYPE:  INP   LOCATION:  6526                         FACILITY:  [facility]   PHYSICIAN:  Arvilla Meres, M.D. LHCDATE OF BIRTH:  [birth year]   DATE OF PROCEDURE:  05/07/2005  DATE OF DISCHARGE:                              CARDIAC CATHETERIZATION   PRIMARY CARE PHYSICIAN:  Dr. Rosalyn Gess. Norins.   CARDIOLOGIST:  Dr. Gala Romney.   PATIENT IDENTIFICATION:  [name] [name] is a very pleasant 65 year old male, with  multiple cardiac risk factors but no known coronary disease, who was  admitted through the emergency room with exertional left arm pain, shortness  of breath and diaphoresis. He ruled out for MI with serial cardiac markers.  He was brought to the cardiac catheterization lab for diagnostic coronary  angiography.   PROCEDURES PERFORMED:  1.  Selective coronary angiography.  2.  Left heart cath.  3.  Left ventriculogram.   DESCRIPTION OF PROCEDURE:  The risks, benefits of the procedure were  explained to Mr. [name] [name] and his son. A consent was signed and placed on the  chart. A 6-French arterial sheath was placed in the right femoral artery  using a modified Seldinger technique. Standard preformed Judkins catheters  including JL-4, JR-4 and angled pigtail were used for the procedure. All  catheter exchanges were made over wire. There were no apparent  complications.   Central aortic pressure is 106/72 with a mean of 88. LV pressure was 106/10  with an LVEDP of 17. There was no gradient on aortic valve pullback.   Left main was short, angiographically normal.   LAD was moderate-sized vessel. It gave rise to a single small diagonal and  two moderate to large septal branches. In the midportion of the LAD, after  the takeoff of the second diagonal, there was a 50-60% left ventricle lesion  which appeared to be perhaps slightly worse in the spider view. Following  this, there was  a 40-50% focal lesion and in the distal vessel which was  rather small there was multiple 30-40% lesions. There was 50% stenosis in  the second diagonal.   Left circumflex was a large vessel. It gave off a tiny OM1, large OM2, and a  large OM3. There was a 20% lesion in the proximal left circumflex, 30% the  mid. In the OM2, there was a 40% mid and in the OM3 there was a 30% mid.   The right coronary artery was a large dominant vessel. It gave off moderate-  sized PDA. There was a large what appeared to be an acute marginal branch  which also seemed to feed the PDA territory. There was a 40% lesion in the  acute marginal, but otherwise no significant coronary disease.   Left ventriculogram done the RAO approach showed an EF of 65% with no  significant wall motion abnormalities or mitral regurgitation.   ASSESSMENT:  1.  Moderate nonobstructive coronary disease with a borderline lesion in the      mid-left  anterior descending artery.  2.  Normal  left ventricular function.   PLAN/DISCUSSION:  I have reviewed the films with Dr. Juanda Chance. Given that the  lesion in the LAD appears borderline significant, we will plan for IVUS or a  pressure wire to evaluate definitively. He will also need aggressive risk  factor management.      Arvilla Meres, M.D. [facility]  Electronically Signed     DB/MEDQ  D:  05/07/2005  T:  05/07/2005  Job:  [id number]   cc:   Rosalyn Gess. Norins, M.D. [facility]  [address]  [place]  [place] [address]

## 2010-11-14 NOTE — H&P (Signed)
NAMETERRYN, REDNER NO.:  [id number]   MEDICAL RECORD NO.:  [id number]          PATIENT TYPE:  INP   LOCATION:  4730                         FACILITY:  [facility]   PHYSICIAN:  Arvilla Meres, M.D. LHCDATE OF BIRTH:  July 24, 1945   DATE OF ADMISSION:  05/06/2005  DATE OF DISCHARGE:                                HISTORY & PHYSICAL   PRIMARY CARE PHYSICIAN:  Rosalyn Gess. Norins, M.D.  He is new to [facility]  [facility]iology.   REASON FOR ADMISSION:  Exertional left arm pain and dyspnea, likely unstable  angina.   HISTORY OF PRESENT ILLNESS:  Mr. Dumond is a delightful 65 year old male with  multiple cardiac risk factors including hypertension, hyperlipidemia,  obesity, and a strong family history of CAD. He denies any previous history  of known coronary disease. He has not had a cardiac catheterization. He did  have Cardiolite done in 1999 for an abnormal EKG what I presumed was his  right bundle branch block which showed no evidence of ischemia.   This afternoon, beginning about 1:30, he developed pain and heaviness in his  left arm. This got worse with exertion. He walked upstairs to lie down and  while walking the step the discomfort in his arm got much worse and he got  markedly short of breath. He was also diaphoretic. There was no associated  chest pain. On lying down, the pain resolved. He called his son who is  critical care nurse here in the ICU and was told to come to the emergency  room. While in the emergency room, he is pain-free. In talking with him, he  notes that he has had several episodes of exertional arm pain over the past  few weeks or months but they have been minor and none as severe as today. He  notes that he also had a recent trip to [place] about two weeks ago but  denies any worsening of his mild chronic lower extremity edema.   In the ER, his EKG shows a right bundle branch block at a rate of 90. There  are no ST or T-wave changes. His first set  of point of care markers are  negative.   REVIEW OF SYSTEMS:  Positive for heavy snoring and possible apneic episodes.  He also has arthritic pain. He has chronic lower extremity edema related to  the venous insufficiency. He also has significant problems with his prostate  which has been done quite well with medical therapy. He has also had an  extensive history of depression but this is well controlled recently. He  denies any recent fevers, chills, nausea, vomiting, bright red blood per  rectum, or melena. There have been no neurologic symptoms. The rest of the  review of systems is negative except for as mentioned in the HPI and problem  list.   PROBLEM LIST:  1.  Hypertension.  2.  Hyperlipidemia.  3.  Severe depression/anxiety.  4.  Obesity.  5.  BPH.  6.  Lower extremity edema secondary to chronic venous insufficiency.  7.  Right bundle branch block.  8.  Aspirin allergy.  9.  Strong family history of coronary artery disease.  10. Gastroesophageal reflux disease.   CURRENT MEDICATIONS:  1.  Xanax 0.5 milligrams daily.  2.  Lipitor 10 milligrams daily.  3.  Flomax 0.4 milligrams daily.  4.  Toprol XL 25 milligrams daily.  5.  Desipramine unknown dose.  6.  Lamictal unknown dose.  7.  Avodart 0.5 milligrams daily.  8.  Reglan 5 milligrams q.a.c. and h.s. on a p.r.n. basis for      gastroesophageal reflux disease.   ALLERGIES:  ASPIRIN which causes hives.   SOCIAL HISTORY:  He lives in [place]. He is currently separated from his  wife. 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 He is originally from [place].  He denies cigarette smoking but does smoke cigars on a weekly basis. He  drinks two glasses of red wine about every other night.   FAMILY HISTORY:  Notable for significant coronary artery disease on his  father's side. His father died at age 48 due to a MI. Mother died at age 8  due to a stroke. His paternal grandfather also had significant coronary   disease.   PHYSICAL EXAMINATION:  GENERAL:  He is a very pleasant male in no acute  distress. He is able to lay flat without any significant difficulties.  Respirations are unlabored.  VITAL SIGNS:  Blood pressure is 136/85 with a heart rate of 90. He is  saturating in the high 90s on two liters nasal cannula.  HEENT:  Sclerae are anicteric. EOMI. There is no xanthelasma. He does have  scattered actinia keratoses on his scalp. Moist mucus membranes.  NECK:  Supple. There is a thick neck. I am unable to evaluate JVD. Carotids  are 2+ bilaterally without any bruits. There is no lymphadenopathy or  thyromegaly.  CARDIOVASCULAR:  Regular rate and rhythm with no murmurs, gallops, or rubs.  LUNGS:  Clear to auscultation.  ABDOMEN:  Obese, nontender, and nondistended. There is no hepatosplenomegaly  and no bruits. His abdominal aortic impulse does not appeared widened. There  are no masses or good bowel sounds.  EXTREMITIES:  Warm with no cyanosis or clubbing. He does have mild chronic  edema on the left. A trace to 1+. Femoral pulses are 2+ bilaterally without  any bruits. DP pulses are 2+ bilaterally.  NEUROLOGICAL:  She is alert and oriented x3 with a very pleasant affect.  Cranial nerves II through XII intact. He moves all four extremities without  any limitation.   EKG shows normal sinus rhythm at a rate of 90 with a right bundle branch  block. No acute ST or T-wave changes. Chest x-ray is clear with no acute  cardiopulmonary disease. This was personally reviewed. Only labs available  are his first set of cardiac markers which show a negative CK-MB and  troponin on point of care.   ASSESSMENT/PLAN:  1.  Exertional left arm pain, dyspnea and diaphoresis:  This is concerning      for unstable angina. We will admit him to the hospital to rule out      myocardial infarction on telemetry. We will start him on heparin, beta     blocker and nitroglycerin. Given his aspirin allergy, we will  start him      on Plavix. Had a long discussion with him and his son regarding the      possible workup paths including cardiac catheterization versus stress      testing. At  this point, they wish to proceed with cardiac      catheterization.  2.  Hypertension:  This is suboptimally controlled. We will titrate his      regimen.  3.  Hyperlipidemia:  Continue Lipitor. Check a fasting lipid panel in the      morning. Titrate as necessary.  4.  Probable obstructive sleep apnea:  We will schedule him for an      outpatient sleep study.      Arvilla Meres, M.D. [facility]  Electronically Signed     DB/MEDQ  D:  05/06/2005  T:  05/07/2005  Job:  [id number]

## 2010-11-14 NOTE — Discharge Summary (Signed)
NAMEPURL, CLAYTOR NO.:  [id number]   MEDICAL RECORD NO.:  [id number]          PATIENT TYPE:  INP   LOCATION:  6526                         FACILITY:  [facility]   PHYSICIAN:  Arvilla Meres, M.D. LHCDATE OF BIRTH:  [birth year]   DATE OF ADMISSION:  05/06/2005  DATE OF DISCHARGE:  05/08/2005                                 DISCHARGE SUMMARY   PRIMARY CARE PHYSICIAN:  Rosalyn Gess. Norins, M.D.   ALLERGIES:  ASPIRIN   PROCEDURE:  Left heart cardiac catheterization.   PRINCIPAL DIAGNOSIS:  Exertional left arm pain.   OTHER DIAGNOSES:  1.  Hypertension.  2.  Hyperlipidemia.  3.  Depression and anxiety.  4.  Obesity.  5.  Benign prostatic hypertrophy.  6.  Lower extremity edema secondary to chronic venous insufficiency.  7.  Right bundle branch block.  8.  ASPIRIN allergy.  9.  A strong family history of coronary artery disease.  10. Gastroesophageal reflux disease.   HISTORY OF PRESENT ILLNESS:  A 65 year old white male with multiple cardiac  risk factors including hypertension, hyperlipidemia, obesity, and family  history who, himself, has no prior history of CAD.  On the afternoon of  05/06/2005 he developed left arm heaviness and pain that was worse with  exertion; and associated with shortness of breath and diaphoresis.  Symptoms  were relieved with rest.  After talking with his son who is a critical care  nurse he was advised to present to the ED.  On arrival to the ED he was pain  free.  EKG showed a right bundle branch block with a rate of 90 and no ST-T  changes.  Cardiac markers were negative.  He was admitted for further  evaluation.   HOSPITAL COURSE:  Mr. Heal underwent left heart cardiac catheterization on  05/07/2005 revealing a normal left main, a 50-60%/40-50% stenosis in the mid-  LAD, diffuse disease in the left circumflex and OM-2, and a 40% lesion in  the distal RCA.  The LAD was wired using a Doppler flow wire revealing a  functional  flow reserve of 0.93 indicating no flow limiting stenosis.  It  was decided, at that point to medically manage him and advise risks for  __________ management.   This morning Mr. Seefeldt has been up and out of bed ambulating without  recurrent left arm discomfort or shortness of breath.  He is being  discharged home today in satisfactory condition.  We did have a discussion  regarding the importance of risk factor modification including exercise and  elimination of cigars from his life style.   DISCHARGE LABS:  Hemoglobin 12.3, hematocrit 36.0, WBC 7.7, platelets 230,  MCV 90.3.  Sodium 137, potassium 4.0, chloride 105, CO2 26, BUN 6,  creatinine 1.1, glucose 103, total bilirubin 0.7, alkaline phosphatase 74,  AST 19, ALT 23, albumin 3.9, cardiac markers are negative x3.  Total  cholesterol 185, triglycerides 162, HDL 45, LDL 108, calcium 8.6, magnesium  2.3, D-dimer was less than 0.22.  BNP was less than 30.0.  TSH 2.256.   DISPOSITION:  The  patient is being discharged home today in satisfactory  condition.   FOLLOWUP PLANS AND APPOINTMENTS:  He will followup with Dr. Prescott Gum  nurse practitioner, PA, and [facility] on November 22 at 10  a.m. for groin check.  He is asked to followup with Dr. Debby Bud within the  next 3-4 weeks.   DISCHARGE MEDICATIONS:  1.  Toprol XL 100 mg daily.  2.  Lipitor 40 mg daily.  3.  Xanax ER 3 mg daily.  4.  Desipramine 75 mg daily.  5.  __________ 0.5 mg daily.  6.  Flomax 20 mg q.h.s.  7.  Plavix 75 mg daily.  8.  Torsemide 20 mg daily.   OUTSTANDING LAB STUDIES:  None.   DURATION DISCHARGE ENCOUNTER:  45 minutes.      Ok Anis, NP      Arvilla Meres, M.D. [facility]  Electronically Signed    CRB/MEDQ  D:  05/08/2005  T:  05/08/2005  Job:  [id number]   cc:   Rosalyn Gess. Norins, M.D. [facility]  [address]  [place]  [place] [address]   Arvilla Meres, M.D. [facility]  [facility]  [address]  [place]   [place] [address]

## 2010-11-14 NOTE — Cardiovascular Report (Signed)
NAMEDAMYEN, KNOLL NO.:  [id number]   MEDICAL RECORD NO.:  [id number]          PATIENT TYPE:  INP   LOCATION:  6526                         FACILITY:  [facility]   PHYSICIAN:  Charlies Constable, M.D. 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  He has had no prior history of known heart disease, but does have  positive risk factors with hypertension, hyperlipidemia and a strong family  history for coronary heart disease.  He was admitted with symptoms  suggestive of unstable angina and was catheterized earlier by Dr. Arvilla Meres, who found an intermediate lesion in the proximal LAD.  The  diagonal branch was actually larger than the LAD and had about a 40%-50%  proximal stenosis.  The LAD had what we thought was a 50%-70% proximal  stenosis.  We decided to bring him back for a pressure wire measurement of  the LAD, to assess its hemodynamic significance.   OPERATOR:  Charlies Constable, M.D.   DESCRIPTION OF PROCEDURE:  The procedure was performed via the right femoral  artery.  We exchanged the old sheath for a new sheath.  The patient was  given an Angiomax bolus and infusion.  We used a Q4, 6-French guiding  catheter with side holes.  After normalizing the pressure wire, we passed a  wire down the LAD, across the lesion in the proximal LAD.  There was no  gradient at rest.  We then gave 40 mg of adenosine through the guiding  catheter with serial pressure measurements.  We did this four times, and the  fractional flow reserve was 0.93, 0.94, 0.93 and 0.93.  These were felt not  to be significant, and the lesion was felt not to be flow-limiting.   CONCLUSION:  Pressure wire measurements in the proximal left anterior  descending coronary artery with a fractional flow reserve of  0.93.   DISPOSITION:  I will plan continued medical therapy and secondary risk  factor modification.           ______________________________  Charlies Constable, M.D. [facility]     BB/MEDQ  D:  05/07/2005  T:  05/08/2005  Job:  [id number]   cc:   Rosalyn Gess. Norins, M.D. [facility]  [address]  [place]  [place] [address]   Arvilla Meres, M.D. [facility]  [facility]  [address]  [place]  [place] [address]   Cardiopulmonary Lab - [facility]

## 2011-01-03 ENCOUNTER — Other Ambulatory Visit: Payer: Self-pay | Admitting: Internal Medicine

## 2011-04-08 ENCOUNTER — Other Ambulatory Visit: Payer: Self-pay | Admitting: Internal Medicine

## 2011-04-09 ENCOUNTER — Other Ambulatory Visit: Payer: Self-pay | Admitting: *Deleted

## 2011-04-09 MED ORDER — OMEPRAZOLE 40 MG PO CPDR: 40.0000 mg | DELAYED_RELEASE_CAPSULE | Freq: Every day | ORAL | Status: DC

## 2011-05-18 ENCOUNTER — Other Ambulatory Visit: Payer: Self-pay | Admitting: Internal Medicine

## 2011-06-29 ENCOUNTER — Telehealth: Payer: Self-pay | Admitting: *Deleted

## 2011-06-29 MED ORDER — SIMVASTATIN 80 MG PO TABS: ORAL_TABLET | ORAL | Status: DC

## 2011-06-29 MED ORDER — LISINOPRIL 10 MG PO TABS: ORAL_TABLET | ORAL | Status: DC

## 2011-06-29 MED ORDER — ATENOLOL 100 MG PO TABS: ORAL_TABLET | ORAL | Status: DC

## 2011-06-29 MED ORDER — FUROSEMIDE 20 MG PO TABS: ORAL_TABLET | ORAL | Status: DC

## 2011-06-29 MED ORDER — EZETIMIBE 10 MG PO TABS: ORAL_TABLET | ORAL | Status: DC

## 2011-06-29 MED ORDER — CLOPIDOGREL BISULFATE 75 MG PO TABS: ORAL_TABLET | ORAL | Status: DC

## 2011-06-29 NOTE — Telephone Encounter (Signed)
Rx Done for 15-day supply only [last office visit 04.2011--last refills had notation for no further refills w/o OV].

## 2011-07-08 ENCOUNTER — Ambulatory Visit: Payer: Self-pay | Admitting: Internal Medicine

## 2011-07-21 ENCOUNTER — Ambulatory Visit (INDEPENDENT_AMBULATORY_CARE_PROVIDER_SITE_OTHER): Payer: BC Managed Care – PPO | Admitting: Internal Medicine

## 2011-07-21 ENCOUNTER — Encounter: Payer: Self-pay | Admitting: Internal Medicine

## 2011-07-21 ENCOUNTER — Other Ambulatory Visit (INDEPENDENT_AMBULATORY_CARE_PROVIDER_SITE_OTHER): Payer: BC Managed Care – PPO

## 2011-07-21 DIAGNOSIS — E785 Hyperlipidemia, unspecified: Secondary | ICD-10-CM

## 2011-07-21 DIAGNOSIS — Z125 Encounter for screening for malignant neoplasm of prostate: Secondary | ICD-10-CM

## 2011-07-21 DIAGNOSIS — I10 Essential (primary) hypertension: Secondary | ICD-10-CM

## 2011-07-21 DIAGNOSIS — K219 Gastro-esophageal reflux disease without esophagitis: Secondary | ICD-10-CM

## 2011-07-21 DIAGNOSIS — I2510 Atherosclerotic heart disease of native coronary artery without angina pectoris: Secondary | ICD-10-CM

## 2011-07-21 DIAGNOSIS — F341 Dysthymic disorder: Secondary | ICD-10-CM

## 2011-07-21 LAB — COMPREHENSIVE METABOLIC PANEL
ALT: 21 U/L (ref 0–53)
AST: 20 U/L (ref 0–37)
Alkaline Phosphatase: 65 U/L (ref 39–117)
BUN: 8 mg/dL (ref 6–23)
CO2: 29 mEq/L (ref 19–32)
Chloride: 101 mEq/L (ref 96–112)
Creatinine, Ser: 1 mg/dL (ref 0.4–1.5)
GFR: 83.6 mL/min (ref >60.00)
Glucose, Bld: 102 mg/dL — ABNORMAL HIGH (ref 70–99)
Total Bilirubin: 0.3 mg/dL (ref 0.3–1.2)
Total Protein: 6.6 g/dL (ref 6.0–8.3)

## 2011-07-21 LAB — HEPATIC FUNCTION PANEL
ALT: 21 U/L (ref 0–53)
AST: 20 U/L (ref 0–37)
Albumin: 4 g/dL (ref 3.5–5.2)
Alkaline Phosphatase: 65 U/L (ref 39–117)
Bilirubin, Direct: 0.1 mg/dL (ref 0.0–0.3)
Total Bilirubin: 0.3 mg/dL (ref 0.3–1.2)
Total Protein: 6.6 g/dL (ref 6.0–8.3)

## 2011-07-21 LAB — LIPID PANEL
Cholesterol: 192 mg/dL (ref 0–200)
HDL: 56.9 mg/dL (ref >39.00)
Total CHOL/HDL Ratio: 3
Triglycerides: 227 mg/dL — ABNORMAL HIGH (ref 0.0–149.0)

## 2011-07-21 MED ORDER — OMEPRAZOLE 40 MG PO CPDR: 40.0000 mg | DELAYED_RELEASE_CAPSULE | Freq: Every day | ORAL | Status: DC

## 2011-07-21 MED ORDER — ALPRAZOLAM 0.5 MG PO TABS: 0.5000 mg | ORAL_TABLET | Freq: Every evening | ORAL | Status: DC | PRN

## 2011-07-21 MED ORDER — LISINOPRIL 10 MG PO TABS: ORAL_TABLET | ORAL | Status: DC

## 2011-07-21 MED ORDER — SIMVASTATIN 80 MG PO TABS: ORAL_TABLET | ORAL | Status: DC

## 2011-07-21 MED ORDER — ALPRAZOLAM ER 3 MG PO TB24: 3.0000 mg | ORAL_TABLET | ORAL | Status: DC

## 2011-07-21 MED ORDER — FINASTERIDE 5 MG PO TABS: 5.0000 mg | ORAL_TABLET | Freq: Every day | ORAL | Status: DC

## 2011-07-21 MED ORDER — FUROSEMIDE 20 MG PO TABS: ORAL_TABLET | ORAL | Status: DC

## 2011-07-21 MED ORDER — TAMSULOSIN HCL 0.4 MG PO CAPS: 0.4000 mg | ORAL_CAPSULE | Freq: Two times a day (BID) | ORAL | Status: DC

## 2011-07-21 MED ORDER — CLOPIDOGREL BISULFATE 75 MG PO TABS: ORAL_TABLET | ORAL | Status: DC

## 2011-07-21 MED ORDER — ATENOLOL 100 MG PO TABS: ORAL_TABLET | ORAL | Status: DC

## 2011-07-21 MED ORDER — VARDENAFIL HCL 20 MG PO TABS: 20.0000 mg | ORAL_TABLET | Freq: Every day | ORAL | Status: DC | PRN

## 2011-07-21 MED ORDER — EZETIMIBE 10 MG PO TABS: ORAL_TABLET | ORAL | Status: DC

## 2011-07-21 MED ORDER — DULOXETINE HCL 60 MG PO CPEP: 60.0000 mg | ORAL_CAPSULE | Freq: Every day | ORAL | Status: DC

## 2011-07-21 MED ORDER — METHOCARBAMOL 500 MG PO TABS: 500.0000 mg | ORAL_TABLET | Freq: Four times a day (QID) | ORAL | Status: AC

## 2011-07-21 NOTE — Patient Instructions (Signed)
Thanks for coming in to see me. Will check labs today.  Psychiatrists: Dr. Andee Poles; Dr. Michail Jewels. Will renew your medications today but without change at this time although I do understand that you wish to come off xanax.  Blood pressure - continue your present medications  Return in 2-32 weeks for physical exam.

## 2011-07-21 NOTE — Progress Notes (Signed)
  Subjective:    Patient ID: [name] [name], male    DOB: 05/25/1946, 66 y.o.   MRN: [id number]  HPI    Review of Systems     Objective:   Physical Exam        Assessment & Plan:

## 2011-07-21 NOTE — Progress Notes (Signed)
  Subjective:    Patient ID: [name] [name], male    DOB: 1945-10-28, 66 y.o.   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 In the last year + since his last visit he has been battling depression - making him nonfunctional. 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  He is out of BP medication. His interval medical history is otherwise unremarkable.  Past Medical History  Diagnosis Date  . Edema   . Dermatophytosis of groin and perianal area   . CAD (coronary artery disease)     cath '06, repeat '08 non-obstructive disease  . GERD (gastroesophageal reflux disease)   . Hyperlipidemia   . Allergy   . History of prostatitis   . History of BPH   . Hypertension   . Anxiety   . Depression    Past Surgical History  Procedure Date  . Adenoidectomy   . Tonsillectomy    Family History  Problem Relation Age of Onset  . Stroke Mother   . Coronary artery disease Father   . Heart attack Father   . Cancer Neg Hx     colon or prostate cancer   History   Social History  . Marital Status: Married    Spouse Name: N/A    Number of Children: N/A  . Years of Education: N/A   Occupational History  . Not on file.   Social History Main Topics  . Smoking status: Former Games developer  . Smokeless tobacco: Never Used  . Alcohol Use: Yes  . Drug Use: No  . Sexually Active: Yes -- Male partner(s)   Other Topics Concern  . Not on file   Social History Narrative   Retired Publishing copy- Aeronautical engineer; remains professionally activeMarried 64yrs- divorce- in process (3/10), lives aloneChildren-Son      Review of Systems System review is negative for any constitutional, cardiac, pulmonary, GI or neuro symptoms or complaints other than as described in the HPI.     Objective:   Physical Exam Filed Vitals:   07/21/11 1121  BP: 148/96  Pulse: 102  Temp: 98 F (36.7 C)   Gen'l -  overweight white man in no distress Pulm - normal respirations, Lungs - CTAP Cor - RRR Neuro - A&O x 3, CN II-XII grossly intact.       Assessment & Plan:

## 2011-07-22 ENCOUNTER — Encounter: Payer: Self-pay | Admitting: Internal Medicine

## 2011-07-22 LAB — LDL CHOLESTEROL, DIRECT: Direct LDL: 108.5 mg/dL

## 2011-07-22 NOTE — Assessment & Plan Note (Signed)
He is taking several psycho-tropic medications - med list is updated. He wishes to change psychiatrists - given names of Dr. Nolen Mu and Dr.Cottle

## 2011-07-22 NOTE — Assessment & Plan Note (Signed)
BP Readings from Last 3 Encounters:  07/21/11 148/96  09/27/09 144/96  09/04/08 106/74   Borderline control but he reports having not taken his medications for 2-3 days.   Plan- resume all medications

## 2011-07-22 NOTE — Assessment & Plan Note (Signed)
stable with no report of chest pain. Has not seen a cardiologist for 1+ years.  Plan- continue risk reduction

## 2011-07-27 ENCOUNTER — Encounter: Payer: Self-pay | Admitting: Internal Medicine

## 2011-08-11 ENCOUNTER — Ambulatory Visit (INDEPENDENT_AMBULATORY_CARE_PROVIDER_SITE_OTHER): Payer: BC Managed Care – PPO | Admitting: Internal Medicine

## 2011-08-11 ENCOUNTER — Encounter: Payer: Self-pay | Admitting: Internal Medicine

## 2011-08-11 DIAGNOSIS — F341 Dysthymic disorder: Secondary | ICD-10-CM

## 2011-08-11 DIAGNOSIS — I2510 Atherosclerotic heart disease of native coronary artery without angina pectoris: Secondary | ICD-10-CM

## 2011-08-11 DIAGNOSIS — I10 Essential (primary) hypertension: Secondary | ICD-10-CM

## 2011-08-11 DIAGNOSIS — E785 Hyperlipidemia, unspecified: Secondary | ICD-10-CM

## 2011-08-11 MED ORDER — TAMSULOSIN HCL 0.4 MG PO CAPS: 0.4000 mg | ORAL_CAPSULE | Freq: Every day | ORAL | Status: DC

## 2011-08-11 MED ORDER — ALPRAZOLAM ER 2 MG PO TB24: 2.0000 mg | ORAL_TABLET | ORAL | Status: AC

## 2011-08-11 MED ORDER — ALPRAZOLAM 0.5 MG PO TABS: 0.5000 mg | ORAL_TABLET | Freq: Every evening | ORAL | Status: DC | PRN

## 2011-08-11 NOTE — Patient Instructions (Signed)
Blood pressure has been running too low. Plan - continue atenolol 100 mg daily for BP and for heart rate control; continue the lasix; stop the lisinopril.  Psyschotropic meds - did refill for Xanax ER 2 mg once a day and refilled the xanax 0.5. When you start with a new psychiatrist they will take over the prescribing function for these drugs and handle the slow taper off of xanax.   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  The remainder of the chemistries are fine!!

## 2011-08-11 NOTE — Progress Notes (Signed)
  Subjective:    Patient ID: [name] [name], male    DOB: Apr 26, 1946, 66 y.o.   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 His lab work revealed good control of lipids with LDL 105, HDL 47. Chemistries were good with a glucose of 102.  His BP has been running low 95-110 systolic on atenolol 100mg , HR 88, lisinopril 10 and lasix 20 mg. He has been feeling ok the last 3 weeks.   I have reviewed the patient's medical history in detail and updated the computerized patient record.    Review of Systems System review is negative for any constitutional, cardiac, pulmonary, GI or neuro symptoms or complaints other than as described in the HPI.     Objective:   Physical Exam Filed Vitals:   08/11/11 1513  BP: 98/62  Pulse: 88  Temp: 97 F (36.1 C)  Resp: 16   Gen'l - obese white man in no distress Pulm - normal respirations Cor - RRR Neuro - non-focal       Assessment & Plan:

## 2011-08-12 NOTE — Assessment & Plan Note (Signed)
Waiting to establish with new psychiatrist. Reviewed all meds, made appropriate adjustments and provided refills.

## 2011-08-12 NOTE — Assessment & Plan Note (Signed)
Risk modification in progress. Asymptomatic. Current with risk stratification-no need for cardiology consult at this time.

## 2011-08-12 NOTE — Assessment & Plan Note (Signed)
Lab reviewed: LDL @ 108 is close to goal of 100 or less; HDL @57  is good.  Plan - continue present regimen.

## 2011-08-12 NOTE — Assessment & Plan Note (Signed)
BP Readings from Last 3 Encounters:  08/11/11 98/62  07/21/11 148/96  09/27/09 144/96   He has been running low BP's by his report and in the office today.  Plan - d/c lisinopril; continue lasix and atenolol           Monitor BP at home and report back after change in meds.

## 2011-08-13 ENCOUNTER — Other Ambulatory Visit: Payer: Self-pay

## 2011-08-13 MED ORDER — NITROGLYCERIN 0.4 MG SL SUBL: 0.4000 mg | SUBLINGUAL_TABLET | SUBLINGUAL | Status: DC | PRN

## 2011-08-13 NOTE — Telephone Encounter (Signed)
Faxed refill request   

## 2011-09-09 ENCOUNTER — Other Ambulatory Visit: Payer: Self-pay | Admitting: Internal Medicine

## 2011-09-09 ENCOUNTER — Ambulatory Visit (INDEPENDENT_AMBULATORY_CARE_PROVIDER_SITE_OTHER): Payer: BC Managed Care – PPO | Admitting: Internal Medicine

## 2011-09-09 ENCOUNTER — Encounter: Payer: Self-pay | Admitting: Internal Medicine

## 2011-09-09 VITALS — BP 142/82 | HR 80 | Temp 97.3°F | Wt 235.8 lb

## 2011-09-09 DIAGNOSIS — T7840XA Allergy, unspecified, initial encounter: Secondary | ICD-10-CM

## 2011-09-09 DIAGNOSIS — F341 Dysthymic disorder: Secondary | ICD-10-CM

## 2011-09-09 DIAGNOSIS — R609 Edema, unspecified: Secondary | ICD-10-CM

## 2011-09-09 DIAGNOSIS — E785 Hyperlipidemia, unspecified: Secondary | ICD-10-CM

## 2011-09-09 DIAGNOSIS — I2510 Atherosclerotic heart disease of native coronary artery without angina pectoris: Secondary | ICD-10-CM

## 2011-09-09 DIAGNOSIS — I10 Essential (primary) hypertension: Secondary | ICD-10-CM

## 2011-09-09 MED ORDER — DULOXETINE HCL 60 MG PO CPEP: 60.0000 mg | ORAL_CAPSULE | Freq: Every day | ORAL | Status: DC

## 2011-09-09 MED ORDER — DESONIDE 0.05 % EX CREA: TOPICAL_CREAM | Freq: Two times a day (BID) | CUTANEOUS | Status: AC

## 2011-09-09 NOTE — Assessment & Plan Note (Signed)
Stable and in good control

## 2011-09-09 NOTE — Assessment & Plan Note (Signed)
Need to work on weight loss. Weight management: smart food choices; PORTION SIZE CONTROL; regular exercise.  Goal - loose 1-2 lbs/month for a total over 2-4 years you need to loose 75 lbs

## 2011-09-09 NOTE — Progress Notes (Signed)
Addended by: Regis Bill on: 09/09/2011 03:28 PM   Modules accepted: Orders

## 2011-09-09 NOTE — Assessment & Plan Note (Signed)
doing fairly well. Will continue cymbalta. Continue to seek a psychiatrist.

## 2011-09-09 NOTE — Assessment & Plan Note (Signed)
Stable with no symptoms of angina. Reviewed cath of '06- no obstructive disease.

## 2011-09-09 NOTE — Assessment & Plan Note (Signed)
BP Readings from Last 3 Encounters:  09/09/11 142/82  08/11/11 98/62  07/21/11 148/96   Continue present medication

## 2011-09-09 NOTE — Assessment & Plan Note (Signed)
Stable venous insufficiency.  Please continue using support stockings. Need large size so that you can wear them.

## 2011-09-09 NOTE — Progress Notes (Signed)
Subjective:    Patient ID: [name] [name], male    DOB: 07/09/1945, 66 y.o.   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 IN the interval since his last visit he has been medically stable. He did have lab work in January - reviewed - revealing normal chemistries including glucose, lipid panel with LDL 108, HDL 56.9. He has no new complaints.  Past Medical History  Diagnosis Date  . Edema   . Dermatophytosis of groin and perianal area   . CAD (coronary artery disease)     cath '06, repeat '08 non-obstructive disease  . GERD (gastroesophageal reflux disease)   . Hyperlipidemia   . Allergy   . History of prostatitis   . History of BPH   . Hypertension   . Anxiety   . Depression    Past Surgical History  Procedure Date  . Adenoidectomy   . Tonsillectomy    Family History  Problem Relation Age of Onset  . Stroke Mother   . Coronary artery disease Father   . Heart attack Father   . Cancer Neg Hx     colon or prostate cancer   History   Social History  . Marital Status: Married    Spouse Name: N/A    Number of Children: N/A  . Years of Education: N/A   Occupational History  . Not on file.   Social History Main Topics  . Smoking status: Former Smoker    Types: Cigars  . Smokeless tobacco: Never Used  . Alcohol Use: Yes  . Drug Use: No  . Sexually Active: Yes -- Male partner(s)   Other Topics Concern  . 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  HEENT:  Negative for hearing loss, ear pain, congestion, neck stiffness and postnasal drip. Negative for sore throat or swallowing problems. Negative for dental complaints.   Eyes: Negative for vision loss or change in visual acuity.    Respiratory: Negative for chest tightness and wheezing. Negative for DOE.   Cardiovascular: Negative for chest pain or palpitations. No decreased exercise tolerance Gastrointestinal: No change in bowel habit. No bloating or gas. No reflux or indigestion Genitourinary: Negative for urgency, frequency, flank pain and difficulty urinating.  Musculoskeletal: Negative for myalgias, back pain, arthralgias and gait problem.  Neurological: Negative for dizziness, tremors, weakness and headaches.  Hematological: Negative for adenopathy.  Psychiatric/Behavioral: Negative for behavioral problems and dysphoric mood.       Objective:   Physical Exam Filed Vitals:   09/09/11 1414  BP: 142/82  Pulse: 80  Temp: 97.3 F (36.3 C)   Wt Readings from Last 3 Encounters:  09/09/11 235 lb 12.8 oz (106.958 kg)  08/11/11 229 lb 8 oz (104.101 kg)  07/21/11 228 lb (103.42 kg)   Gen'l: Well nourished well developed white male in no acute distress  HEENT: Head: Normocephalic and atraumatic. Right Ear: External ear normal. EAC/TM nl. Left Ear: External ear normal.  EAC/TM nl. Nose: Nose normal. Mouth/Throat: Oropharynx is clear and moist. Dentition - native, in good repair. No buccal or palatal lesions. Posterior pharynx clear. Eyes: Conjunctivae and sclera clear. EOM intact. Pupils are equal, round, and reactive to light. Right eye exhibits no discharge. Left eye exhibits no discharge. Neck: Normal range of  motion. Neck supple. No JVD present. No tracheal deviation present. No thyromegaly present.  Cardiovascular: Normal rate, regular rhythm, no gallop, no friction rub, no murmur heard.      Quiet precordium. 2+ radial and DP pulses . No carotid bruits Pulmonary/Chest: Effort normal. No respiratory distress or increased WOB, no wheezes, no rales. No chest wall deformity or CVAT. Abdominal: Obesem Soft. Bowel sounds are normal in all quadrants. He exhibits no distension, no tenderness, no rebound or guarding, No  heptosplenomegaly  Genitourinary:  deferred Musculoskeletal: Normal range of motion. He exhibits no edema and no tenderness.       Small and large joints without redness, synovial thickening or deformity. Full range of motion preserved about all small, median and large joints.  Lymphadenopathy:    He has no cervical or supraclavicular adenopathy.  Neurological: He is alert and oriented to person, place, and time. CN II-XII intact. DTRs 2+ and symmetrical biceps, radial and patellar tendons. Cerebellar function normal with no tremor, rigidity, normal gait and station.  Skin: Skin is warm and dry. No rash noted. No erythema. 2_+ peripheral edema Psychiatric: He has a normal mood and affect. His behavior is normal. Thought content normal.   Lab Results  Component Value Date   WBC 5.4 09/26/2009   HGB 12.7* 09/26/2009   HCT 38.5* 09/26/2009   PLT 217.0 09/26/2009   GLUCOSE 102* 07/21/2011   CHOL 192 07/21/2011   TRIG 227.0* 07/21/2011   HDL 56.90 07/21/2011   LDLDIRECT 108.5 07/21/2011   LDLCALC 53 09/26/2009        ALT 21 07/21/2011   AST 20 07/21/2011        NA 140 07/21/2011   K 4.8 07/21/2011   CL 101 07/21/2011   CREATININE 1.0 07/21/2011   BUN 8 07/21/2011   CO2 29 07/21/2011   TSH 2.71 09/26/2009   PSA 2.83 07/21/2011          Assessment & Plan:

## 2011-09-09 NOTE — Assessment & Plan Note (Signed)
Lab Results  Component Value Date   CHOL 192 07/21/2011   HDL 56.90 07/21/2011   LDLCALC 53 09/26/2009   LDLDIRECT 108.5 07/21/2011   TRIG 227.0* 07/21/2011   CHOLHDL 3 07/21/2011   Very adequate control. NO change in medications. Please be vigilant about a low fat diet.

## 2012-02-04 ENCOUNTER — Other Ambulatory Visit: Payer: Self-pay

## 2012-02-04 MED ORDER — ALPRAZOLAM 0.5 MG PO TABS: 0.5000 mg | ORAL_TABLET | Freq: Every evening | ORAL | Status: DC | PRN

## 2012-02-04 NOTE — Addendum Note (Signed)
Addended by: Anselm Jungling on: 02/04/2012 03:33 PM   Modules accepted: Orders

## 2012-02-04 NOTE — Telephone Encounter (Signed)
Rx called in to pharmacy. 

## 2012-02-10 ENCOUNTER — Other Ambulatory Visit: Payer: Self-pay | Admitting: *Deleted

## 2012-02-10 NOTE — Telephone Encounter (Signed)
Error in charting.

## 2012-02-10 NOTE — Telephone Encounter (Signed)
Rx for alprazolam  0.5mg  faxed on 02/04/2012 . Pharmacy [facility] fax states patient having W/D symptoms on plain Xanax. Requesting alprazolam ER 2 mg tablet take one tablet every AM. This is not on his medication list. Last ov 09/09/2011. Please advise

## 2012-02-10 NOTE — Telephone Encounter (Signed)
The pt called the triage line hoping to get a refill of alprazolam "extended release". I told pt the refill was already in, but i was unsure of if it was extended release.  Pt is hoping to get a call back - [phone], thanks!

## 2012-02-11 MED ORDER — ALPRAZOLAM ER 2 MG PO TB24: 2.0000 mg | ORAL_TABLET | ORAL | Status: DC

## 2012-02-11 NOTE — Addendum Note (Signed)
Addended by: Elnora Morrison on: 02/11/2012 09:20 AM   Modules accepted: Orders

## 2012-02-11 NOTE — Telephone Encounter (Signed)
Left message on patient call back # that medication alprazolam ER had been called to pharmacy.

## 2012-03-07 ENCOUNTER — Other Ambulatory Visit: Payer: Self-pay | Admitting: Internal Medicine

## 2012-04-06 ENCOUNTER — Other Ambulatory Visit: Payer: Self-pay | Admitting: Internal Medicine

## 2012-05-02 ENCOUNTER — Other Ambulatory Visit: Payer: Self-pay | Admitting: Internal Medicine

## 2012-08-01 ENCOUNTER — Telehealth: Payer: Self-pay | Admitting: Internal Medicine

## 2012-08-01 ENCOUNTER — Other Ambulatory Visit: Payer: Self-pay | Admitting: Internal Medicine

## 2012-08-01 NOTE — Telephone Encounter (Signed)
Patient Information:  Caller Name: [name] [name]  Phone: [phone]  Patient: [name] [name]  Gender: Male  DOB: 08/25/1951  Age: 68 Years  PCP: [name] [name] (Adults only)  Office Follow Up:  Does the office need to follow up with this patient?: Yes  Instructions For The Office: Asking if blood pressure medication should be reduced.   Symptoms  Reason For Call & Symptoms: [name] [name] states she has been under the care of a [employer] and dietician. Is on a 1500 calorie diet. Has lost 20 pound in last 3 weeks. Is exercising regularly on treadmill and has increased stamina. States BP ranging from 112/70 to 98/68. Taking Lisinopril 30mg  -  states over last week she has felt sluggish for a few hours after taking Lisinopril.. Asking if Lisinopril should be decreased and if so will need a new prescription.  Reviewed Health History In EMR: Yes  Reviewed Medications In EMR: Yes  Reviewed Allergies In EMR: Yes  Reviewed Surgeries / Procedures: Yes  Date of Onset of Symptoms: 07/25/2012  Guideline(s) Used:  High Blood Pressure  Disposition Per Guideline:   Discuss with PCP and Callback by Nurse Today  Reason For Disposition Reached:   Taking BP medications and feels is having side effects (e.g., impotence, cough, dizziness)  Advice Given:  N/A

## 2012-08-02 ENCOUNTER — Other Ambulatory Visit: Payer: Self-pay | Admitting: Internal Medicine

## 2012-08-02 NOTE — Telephone Encounter (Signed)
Please read phone note below and advise. 

## 2012-08-03 ENCOUNTER — Telehealth: Payer: Self-pay | Admitting: Internal Medicine

## 2012-08-03 NOTE — Telephone Encounter (Signed)
[name] [name] Age, not [name]! Same message as yesterday.  May take lisinopril 30 mg 1/2 tab. If not able to split tab may have new Rx for lisinopril 20 mg daily

## 2012-08-03 NOTE — Telephone Encounter (Signed)
Patient is calling to check the status of the refill request sent over from his pharmacy for Alprazolam

## 2012-08-03 NOTE — Telephone Encounter (Signed)
Called pt to let him know that we faxed his rx to his pharmacy.

## 2012-08-22 ENCOUNTER — Other Ambulatory Visit: Payer: Self-pay | Admitting: Internal Medicine

## 2012-08-22 NOTE — Telephone Encounter (Signed)
Pt has not been seen since 08/30/11. Please advise.

## 2012-08-25 ENCOUNTER — Encounter: Payer: BC Managed Care – PPO | Admitting: Internal Medicine

## 2012-08-25 DIAGNOSIS — Z0289 Encounter for other administrative examinations: Secondary | ICD-10-CM

## 2012-09-08 ENCOUNTER — Encounter (HOSPITAL_COMMUNITY): Payer: Self-pay | Admitting: *Deleted

## 2012-09-08 ENCOUNTER — Emergency Department (HOSPITAL_COMMUNITY)
Admission: EM | Admit: 2012-09-08 | Discharge: 2012-09-08 | Disposition: A | Discharge status: Home / Self Care | Payer: Medicare PPO | Attending: Emergency Medicine | Admitting: Emergency Medicine

## 2012-09-08 ENCOUNTER — Emergency Department (HOSPITAL_COMMUNITY): Payer: Medicare PPO

## 2012-09-08 DIAGNOSIS — I2510 Atherosclerotic heart disease of native coronary artery without angina pectoris: Secondary | ICD-10-CM | POA: Insufficient documentation

## 2012-09-08 DIAGNOSIS — I10 Essential (primary) hypertension: Secondary | ICD-10-CM | POA: Insufficient documentation

## 2012-09-08 DIAGNOSIS — Z79899 Other long term (current) drug therapy: Secondary | ICD-10-CM | POA: Insufficient documentation

## 2012-09-08 DIAGNOSIS — Z872 Personal history of diseases of the skin and subcutaneous tissue: Secondary | ICD-10-CM | POA: Insufficient documentation

## 2012-09-08 DIAGNOSIS — E785 Hyperlipidemia, unspecified: Secondary | ICD-10-CM | POA: Insufficient documentation

## 2012-09-08 DIAGNOSIS — F411 Generalized anxiety disorder: Secondary | ICD-10-CM | POA: Insufficient documentation

## 2012-09-08 DIAGNOSIS — E669 Obesity, unspecified: Secondary | ICD-10-CM | POA: Insufficient documentation

## 2012-09-08 DIAGNOSIS — R0789 Other chest pain: Secondary | ICD-10-CM | POA: Insufficient documentation

## 2012-09-08 DIAGNOSIS — K219 Gastro-esophageal reflux disease without esophagitis: Secondary | ICD-10-CM | POA: Insufficient documentation

## 2012-09-08 DIAGNOSIS — N400 Enlarged prostate without lower urinary tract symptoms: Secondary | ICD-10-CM | POA: Insufficient documentation

## 2012-09-08 DIAGNOSIS — Z87891 Personal history of nicotine dependence: Secondary | ICD-10-CM | POA: Insufficient documentation

## 2012-09-08 DIAGNOSIS — F3289 Other specified depressive episodes: Secondary | ICD-10-CM | POA: Insufficient documentation

## 2012-09-08 LAB — CBC WITH DIFFERENTIAL/PLATELET
Basophils Relative: 0 % (ref 0–1)
Eosinophils Absolute: 0 10*3/uL (ref 0.0–0.7)
Eosinophils Relative: 0 % (ref 0–5)
Lymphs Abs: 0.9 10*3/uL (ref 0.7–4.0)
MCH: 32.8 pg (ref 26.0–34.0)
MCHC: 35.9 g/dL (ref 30.0–36.0)
MCV: 91.3 fL (ref 78.0–100.0)
Monocytes Relative: 5 % (ref 3–12)
Neutrophils Relative %: 88 % — ABNORMAL HIGH (ref 43–77)
Platelets: 197 10*3/uL (ref 150–400)
RBC: 4.97 MIL/uL (ref 4.22–5.81)

## 2012-09-08 LAB — BASIC METABOLIC PANEL
BUN: 9 mg/dL (ref 6–23)
CO2: 20 mEq/L (ref 19–32)
Chloride: 102 mEq/L (ref 96–112)
Creatinine, Ser: 0.81 mg/dL (ref 0.50–1.35)
Glucose, Bld: 135 mg/dL — ABNORMAL HIGH (ref 70–99)
Potassium: 3.8 mEq/L (ref 3.5–5.1)

## 2012-09-08 MED ORDER — ALPRAZOLAM 0.25 MG PO TABS
1.0000 mg | ORAL_TABLET | Freq: Once | ORAL | Status: AC
  Administered 2012-09-08: 1 mg via ORAL
  Filled 2012-09-08: qty 4

## 2012-09-08 NOTE — Discharge Instructions (Signed)
Chest Pain (Nonspecific) It is often hard to give a specific diagnosis for the cause of chest pain. There is always a chance that your pain could be related to something serious, such as a heart attack or a blood clot in the lungs. You need to follow up with your caregiver for further evaluation. CAUSES   Heartburn.  Pneumonia or bronchitis.  Anxiety or stress.  Inflammation around your heart (pericarditis) or lung (pleuritis or pleurisy).  A blood clot in the lung.  A collapsed lung (pneumothorax). It can develop suddenly on its own (spontaneous pneumothorax) or from injury (trauma) to the chest.  Shingles infection (herpes zoster virus). The chest wall is composed of bones, muscles, and cartilage. Any of these can be the source of the pain.  The bones can be bruised by injury.  The muscles or cartilage can be strained by coughing or overwork.  The cartilage can be affected by inflammation and become sore (costochondritis). DIAGNOSIS  Lab tests or other studies, such as X-rays, electrocardiography, stress testing, or cardiac imaging, may be needed to find the cause of your pain.  TREATMENT   Treatment depends on what may be causing your chest pain. Treatment may include:  Acid blockers for heartburn.  Anti-inflammatory medicine.  Pain medicine for inflammatory conditions.  Antibiotics if an infection is present.  You may be advised to change lifestyle habits. This includes stopping smoking and avoiding alcohol, caffeine, and chocolate.  You may be advised to keep your head raised (elevated) when sleeping. This reduces the chance of acid going backward from your stomach into your esophagus.  Most of the time, nonspecific chest pain will improve within 2 to 3 days with rest and mild pain medicine. HOME CARE INSTRUCTIONS   If antibiotics were prescribed, take your antibiotics as directed. Finish them even if you start to feel better.  For the next few days, avoid physical  activities that bring on chest pain. Continue physical activities as directed.  Do not smoke.  Avoid drinking alcohol.  Only take over-the-counter or prescription medicine for pain, discomfort, or fever as directed by your caregiver.  Follow your caregiver's suggestions for further testing if your chest pain does not go away.  Keep any follow-up appointments you made. If you do not go to an appointment, you could develop lasting (chronic) problems with pain. If there is any problem keeping an appointment, you must call to reschedule. SEEK MEDICAL CARE IF:   You think you are having problems from the medicine you are taking. Read your medicine instructions carefully.  Your chest pain does not go away, even after treatment.  You develop a rash with blisters on your chest. SEEK IMMEDIATE MEDICAL CARE IF:   You have increased chest pain or pain that spreads to your arm, neck, jaw, back, or abdomen.  You develop shortness of breath, an increasing cough, or you are coughing up blood.  You have severe back or abdominal pain, feel nauseous, or vomit.  You develop severe weakness, fainting, or chills.  You have a fever. THIS IS AN EMERGENCY. Do not wait to see if the pain will go away. Get medical help at once. Call your local emergency services (911 in [place] Do not drive yourself to the hospital. MAKE SURE YOU:   Understand these instructions.  Will watch your condition.  Will get help right away if you are not doing well or get worse. Document Released: 03/25/2005 Document Revised: 09/07/2011 Document Reviewed: 01/19/2008 [employer] Patient Information 2013 [employer].  

## 2012-09-08 NOTE — ED Notes (Signed)
Pt undressed, in gown, on monitor, continuous pulse oximetry and blood pressure cuff; vitals and EKG being performed 

## 2012-09-08 NOTE — ED Notes (Signed)
Patient states he had one episode of a sharp pain in left chest today, patient states he has been really anxious and is out of anti-anxiety meds.  Patient denies pain at this time and states pain was sharp and then subsided quickly

## 2012-09-08 NOTE — ED Provider Notes (Signed)
History     CSN: [id number]  Arrival date & time 09/08/12  1416  Chief Complaint  Patient presents with  . Chest Pain  . Anxiety   HPI Comments: 67 y/o male with history of HTN, HLD, and Anxiety who presents with cc of chest pain. The patient states he had a sudden onset of sharp substernal chest pain at approximately noon today. The pain occurred while walking in his kitchen. He states the pain resolved after several seconds and hasn't returned.   Patient is a 67 y.o. male presenting with chest pain and anxiety. The history is provided by the patient. History limited by: none.  Chest Pain Pain location:  Substernal area Pain quality: sharp   Pain radiates to:  Does not radiate Pain radiates to the back: no   Pain severity:  No pain Onset quality:  Sudden Duration: "seconds" Timing:  Rare Progression:  Resolved Chronicity:  New Relieved by:  Nothing Worsened by:  Nothing tried Ineffective treatments:  None tried Associated symptoms: anxiety   Associated symptoms: no abdominal pain, no cough, no fever, no nausea, no shortness of breath and not vomiting   Risk factors: high cholesterol, hypertension and obesity   Risk factors: no smoking   Anxiety Associated symptoms include chest pain. Pertinent negatives include no abdominal pain, chills, congestion, coughing, fever, nausea, rash or vomiting.    Past Medical History  Diagnosis Date  . Edema   . Dermatophytosis of groin and perianal area   . CAD (coronary artery disease)     cath '06, repeat '08 non-obstructive disease  . GERD (gastroesophageal reflux disease)   . Hyperlipidemia   . Allergy   . History of prostatitis   . History of BPH   . Hypertension   . Anxiety   . Depression     Past Surgical History  Procedure Laterality Date  . Adenoidectomy    . Tonsillectomy      Family History  Problem Relation Age of Onset  . Stroke Mother   . Coronary artery disease Father   . Heart attack Father   . Cancer Neg  Hx     colon or prostate cancer    History  Substance Use Topics  . Smoking status: Former Smoker    Types: Cigars  . Smokeless tobacco: Never Used  . Alcohol Use: Yes     Comment: ocassionally      Review of Systems  Constitutional: Negative for fever and chills.  HENT: Negative for congestion.   Respiratory: Negative for cough and shortness of breath.   Cardiovascular: Positive for chest pain.  Gastrointestinal: Negative for nausea, vomiting and abdominal pain.  Genitourinary: Negative for dysuria and frequency.  Skin: Negative for rash.  All other systems reviewed and are negative.    Allergies  Aspirin  Home Medications   Current Outpatient Rx  Name  Route  Sig  Dispense  Refill  . ALPRAZolam (XANAX XR) 2 MG 24 hr tablet      TAKE 1 TABLET EVERY MORNING   30 tablet   5   . ALPRAZolam (XANAX) 0.5 MG tablet      TAKE 1 TABLET AT BEDTIME AS NEEDED   30 tablet   5   . atenolol (TENORMIN) 100 MG tablet      TAKE 1 TABLET BY MOUTH EVERY DAY FOR BLOOD PRESSURE & HEART RATE CONTROL.   30 tablet   5   . clopidogrel (PLAVIX) 75 MG tablet  TAKE 1 TABLET BY MOUTH EVERY DAY FOR CIRCULATION.   30 tablet   6   . CYMBALTA 60 MG capsule      TAKE 1 CAPSULE (60 MG TOTAL) BY MOUTH DAILY.   30 capsule   5   . desonide (DESOWEN) 0.05 % cream   Topical   Apply topically 2 (two) times daily.   30 g   0   . finasteride (PROSCAR) 5 MG tablet      TAKE 1 TABLET (5 MG TOTAL) BY MOUTH DAILY.   90 tablet   2   . nitroGLYCERIN (NITROQUICK) 0.4 MG SL tablet   Sublingual   Place 1 tablet (0.4 mg total) under the tongue every 5 (five) minutes as needed.   30 tablet   11   . omeprazole (PRILOSEC) 40 MG capsule      TAKE 1 CAPSULE (40 MG TOTAL) BY MOUTH DAILY.   90 capsule   2   . simvastatin (ZOCOR) 80 MG tablet      TAKE 1 TABLET BY MOUTH EVERY EVENING FOR CHOLESTEROL MANAGEMENT.   30 tablet   10   . Tamsulosin HCl (FLOMAX) 0.4 MG CAPS   Oral    Take 1 capsule (0.4 mg total) by mouth daily.   60 capsule   5   . vardenafil (LEVITRA) 20 MG tablet   Oral   Take 1 tablet (20 mg total) by mouth daily as needed.   10 tablet   6    BP 152/118  Pulse 96  Temp(Src) 99.7 F (37.6 C) (Oral)  Resp 16  SpO2 93%  Physical Exam  Nursing note and vitals reviewed. Constitutional: He is oriented to person, place, and time. He appears well-developed and well-nourished. No distress.  obese  HENT:  Head: Normocephalic and atraumatic.  Mouth/Throat: No oropharyngeal exudate.  Eyes: Pupils are equal, round, and reactive to light.  Neck: Normal range of motion. Neck supple.  Cardiovascular: Normal rate, regular rhythm and normal heart sounds.  Exam reveals no gallop and no friction rub.   No murmur heard. Pulmonary/Chest: Effort normal and breath sounds normal.  Abdominal: Soft. He exhibits no distension. There is no tenderness.  Musculoskeletal: Normal range of motion. He exhibits no edema and no tenderness.  Neurological: He is alert and oriented to person, place, and time.  Skin: Skin is warm and dry.  Psychiatric: His mood appears anxious.    ED Course  Procedures (including critical care time)  Labs Reviewed  CBC WITH DIFFERENTIAL - Abnormal; Notable for the following:    WBC 12.4 (*)    Neutrophils Relative 88 (*)    Neutro Abs 11.0 (*)    Lymphocytes Relative 7 (*)    All other components within normal limits  BASIC METABOLIC PANEL - Abnormal; Notable for the following:    Glucose, Bld 135 (*)    All other components within normal limits  TROPONIN I   Dg Chest Portable 1 View  09/08/2012  *RADIOLOGY REPORT*  Clinical Data: Chest pain, anxiety.  PORTABLE CHEST - 1 VIEW  Comparison: 05/06/2005  Findings: Left basilar scarring, stable.  Right lung is clear. Heart is borderline in size.  No effusions or acute bony abnormality.  IMPRESSION: No active cardiopulmonary disease.   Original Report Authenticated By: Charlett Nose,  M.D.     EKG: NSR. HR 86. RBBB. Inverted T's in lead V2-V3 which are changed from prior.   1. Chest pain   2. Anxiety  MDM  67 y/o who presents with non exertional atypical chest pain that lasted seconds. EKG without acute ischemic changes. Troponin negative. CXR unremarkable. Doubt PE given no risk factors and atypical presentation. Likely non-cardiac chest pain. Discussed results with the patient who was in agreement with plan to follow up with his cardiologist in the next 1-2 days.         Shanon Ace, MD 09/08/12 [phone]

## 2012-09-13 NOTE — ED Provider Notes (Signed)
I saw and evaluated the patient, reviewed the resident's note and I agree with the findings and plan. Pt with several second episode cp at rest. No other exertional cp or discomfort. No palpitations or faintness. No sob or nv. No diaphoresis. Chest cta. Rrr. Ecg. Labs.   Suzi Roots, MD 09/13/12 [phone]

## 2012-09-30 ENCOUNTER — Other Ambulatory Visit: Payer: Self-pay | Admitting: Internal Medicine

## 2012-11-22 ENCOUNTER — Other Ambulatory Visit: Payer: Self-pay | Admitting: Internal Medicine

## 2012-11-29 ENCOUNTER — Other Ambulatory Visit: Payer: Self-pay | Admitting: Internal Medicine

## 2013-01-02 ENCOUNTER — Other Ambulatory Visit: Payer: Self-pay

## 2013-01-02 MED ORDER — SIMVASTATIN 80 MG PO TABS: 80.0000 mg | ORAL_TABLET | Freq: Every day | ORAL | Status: DC

## 2013-01-02 MED ORDER — CLOPIDOGREL BISULFATE 75 MG PO TABS: 75.0000 mg | ORAL_TABLET | Freq: Every day | ORAL | Status: DC

## 2013-01-02 MED ORDER — ATENOLOL 100 MG PO TABS: 100.0000 mg | ORAL_TABLET | Freq: Every day | ORAL | Status: DC

## 2013-01-02 MED ORDER — DULOXETINE HCL 60 MG PO CPEP: ORAL_CAPSULE | ORAL | Status: DC

## 2013-01-03 ENCOUNTER — Other Ambulatory Visit: Payer: Self-pay

## 2013-01-03 MED ORDER — DULOXETINE HCL 60 MG PO CPEP: ORAL_CAPSULE | ORAL | Status: DC

## 2013-01-03 NOTE — Telephone Encounter (Signed)
Pharmacy faxed a request to dispense 90 for Cymbalta 60 mg. 90 day supply sent. Note: this was okayed yesterday #30 with 5 refills

## 2013-01-17 ENCOUNTER — Other Ambulatory Visit: Payer: Self-pay | Admitting: Internal Medicine

## 2013-01-18 ENCOUNTER — Telehealth: Payer: Self-pay | Admitting: Internal Medicine

## 2013-01-18 NOTE — Telephone Encounter (Signed)
Pt called stated that drug store request refill for Alprazolam XR 2 mg and Alprazolan .5 mg. Pt state that drug store did not get any response from Dr. Debby Bud office. Pt is out of this med. Please advise.

## 2013-01-18 NOTE — Telephone Encounter (Signed)
Ok to refill 

## 2013-01-18 NOTE — Telephone Encounter (Signed)
Alprazolam called to pharmacy  

## 2013-01-19 MED ORDER — ALPRAZOLAM ER 2 MG PO TB24: 2.0000 mg | ORAL_TABLET | ORAL | Status: DC

## 2013-01-19 MED ORDER — ALPRAZOLAM 0.5 MG PO TABS: 0.5000 mg | ORAL_TABLET | Freq: Every evening | ORAL | Status: DC | PRN

## 2013-01-19 NOTE — Telephone Encounter (Signed)
Both prescriptions have been called to the [facility] on [address]

## 2013-03-10 ENCOUNTER — Other Ambulatory Visit: Payer: Self-pay | Admitting: Internal Medicine

## 2013-03-21 ENCOUNTER — Other Ambulatory Visit: Payer: Self-pay | Admitting: Internal Medicine

## 2013-03-24 ENCOUNTER — Other Ambulatory Visit: Payer: Self-pay

## 2013-03-24 NOTE — Telephone Encounter (Signed)
Received fax from pharmacy stating [place] going [place] 2[place] and need early refill on Alprazolam XR 2 mg. Ok per MD for # 60, pharmacy notified ok to refill early.

## 2013-05-13 ENCOUNTER — Encounter (HOSPITAL_COMMUNITY): Payer: Self-pay | Admitting: Emergency Medicine

## 2013-05-13 ENCOUNTER — Emergency Department (HOSPITAL_COMMUNITY)
Admission: EM | Admit: 2013-05-13 | Discharge: 2013-05-14 | Disposition: A | Discharge status: Home / Self Care | Payer: Medicare PPO | Attending: Emergency Medicine | Admitting: Emergency Medicine

## 2013-05-13 DIAGNOSIS — M549 Dorsalgia, unspecified: Secondary | ICD-10-CM | POA: Insufficient documentation

## 2013-05-13 DIAGNOSIS — R110 Nausea: Secondary | ICD-10-CM | POA: Insufficient documentation

## 2013-05-13 DIAGNOSIS — R0602 Shortness of breath: Secondary | ICD-10-CM | POA: Insufficient documentation

## 2013-05-13 DIAGNOSIS — F3289 Other specified depressive episodes: Secondary | ICD-10-CM | POA: Insufficient documentation

## 2013-05-13 DIAGNOSIS — R42 Dizziness and giddiness: Secondary | ICD-10-CM | POA: Insufficient documentation

## 2013-05-13 DIAGNOSIS — Z8619 Personal history of other infectious and parasitic diseases: Secondary | ICD-10-CM | POA: Insufficient documentation

## 2013-05-13 DIAGNOSIS — N401 Enlarged prostate with lower urinary tract symptoms: Secondary | ICD-10-CM | POA: Insufficient documentation

## 2013-05-13 DIAGNOSIS — R05 Cough: Secondary | ICD-10-CM | POA: Insufficient documentation

## 2013-05-13 DIAGNOSIS — N138 Other obstructive and reflux uropathy: Secondary | ICD-10-CM | POA: Insufficient documentation

## 2013-05-13 DIAGNOSIS — Z7902 Long term (current) use of antithrombotics/antiplatelets: Secondary | ICD-10-CM | POA: Insufficient documentation

## 2013-05-13 DIAGNOSIS — R350 Frequency of micturition: Secondary | ICD-10-CM

## 2013-05-13 DIAGNOSIS — K219 Gastro-esophageal reflux disease without esophagitis: Secondary | ICD-10-CM | POA: Insufficient documentation

## 2013-05-13 DIAGNOSIS — R5381 Other malaise: Secondary | ICD-10-CM | POA: Insufficient documentation

## 2013-05-13 DIAGNOSIS — R059 Cough, unspecified: Secondary | ICD-10-CM | POA: Insufficient documentation

## 2013-05-13 DIAGNOSIS — R52 Pain, unspecified: Secondary | ICD-10-CM | POA: Insufficient documentation

## 2013-05-13 DIAGNOSIS — I10 Essential (primary) hypertension: Secondary | ICD-10-CM | POA: Insufficient documentation

## 2013-05-13 DIAGNOSIS — F329 Major depressive disorder, single episode, unspecified: Secondary | ICD-10-CM | POA: Insufficient documentation

## 2013-05-13 DIAGNOSIS — R609 Edema, unspecified: Secondary | ICD-10-CM | POA: Insufficient documentation

## 2013-05-13 DIAGNOSIS — J3489 Other specified disorders of nose and nasal sinuses: Secondary | ICD-10-CM | POA: Insufficient documentation

## 2013-05-13 DIAGNOSIS — Z87891 Personal history of nicotine dependence: Secondary | ICD-10-CM | POA: Insufficient documentation

## 2013-05-13 DIAGNOSIS — E785 Hyperlipidemia, unspecified: Secondary | ICD-10-CM | POA: Insufficient documentation

## 2013-05-13 DIAGNOSIS — Z760 Encounter for issue of repeat prescription: Secondary | ICD-10-CM

## 2013-05-13 DIAGNOSIS — Z5987 Material hardship due to limited financial resources, not elsewhere classified: Secondary | ICD-10-CM | POA: Insufficient documentation

## 2013-05-13 DIAGNOSIS — R300 Dysuria: Secondary | ICD-10-CM | POA: Insufficient documentation

## 2013-05-13 DIAGNOSIS — I2510 Atherosclerotic heart disease of native coronary artery without angina pectoris: Secondary | ICD-10-CM | POA: Insufficient documentation

## 2013-05-13 DIAGNOSIS — Z79899 Other long term (current) drug therapy: Secondary | ICD-10-CM | POA: Insufficient documentation

## 2013-05-13 DIAGNOSIS — F411 Generalized anxiety disorder: Secondary | ICD-10-CM | POA: Insufficient documentation

## 2013-05-13 DIAGNOSIS — Z598 Other problems related to housing and economic circumstances: Secondary | ICD-10-CM | POA: Insufficient documentation

## 2013-05-13 NOTE — ED Notes (Signed)
Pt has BLE edema. C/o body aches and some SOB. States he has not taken his medication because he has been out of meds. Since he has been out of town.

## 2013-05-13 NOTE — ED Provider Notes (Signed)
CSN: [id number]     Arrival date & time 05/13/13  2255 History   This chart was scribed for non-physician practitioner Trixie Dredge, PA-C, working with Olivia Mackie, MD, by Yevette Edwards, ED Scribe. This patient was seen in room WA04/WA04 and the patient's care was started at 11:47 PM.  First MD Initiated Contact with Patient 05/13/13 2338     Chief Complaint  Patient presents with  . Generalized Body Aches  . Leg Swelling    The history is provided by the patient. No language interpreter was used.   HPI Comments: [name] [name] is a 67 y.o. male, with a h/o CAD, who presents to the Emergency Department complaining of lightheadedness associated with nausea  which occurred while the pt was driving home from [facility]; he recently returned from a six week trip in [place]. States this feels like his typical jet lag but is lasting longer than normal. States that he feels lousy every morning but feels better during the day. Patient ran out of his medication approximately 10 days ago and has had gradual worsening of all of his chronic symptoms. He reports he has also experienced swelling to his lower extremities bilaterally. The pt has a h/o lower leg swelling, and he attributes the increased swelling to increased ambulating while abroad. He does not currently take medication for extremity swelling.  The pt has experienced cold symptoms which are currently improving. He is also experiencing baseline joint pain to his hands, knees, and back; the pt has experienced joint pain intermittently for ten years. He has also experienced increased frequency and dysuria; the pt has a h/o prostatitis and he ran out of medication two weeks ago. Additionally, he states he is experiencing pain to the roof of his mouth; the pain has been persistent for three months; he reports that he is a former cigar smoker. He denies any chest pain, SOB, emesis, diarrhea, abdominal pain, or fever.   Has a hx CAD with nonobstructive disease.  PCP is Dr Arthur Holms.   Past Medical History  Diagnosis Date  . Edema   . Dermatophytosis of groin and perianal area   . CAD (coronary artery disease)     cath '06, repeat '08 non-obstructive disease  . GERD (gastroesophageal reflux disease)   . Hyperlipidemia   . Allergy   . History of prostatitis   . History of BPH   . Hypertension   . Anxiety   . Depression    Past Surgical History  Procedure Laterality Date  . Adenoidectomy    . Tonsillectomy     Family History  Problem Relation Age of Onset  . Stroke Mother   . Coronary artery disease Father   . Heart attack Father   . Cancer Neg Hx     colon or prostate cancer   History  Substance Use Topics  . Smoking status: Former Smoker    Types: Cigars  . Smokeless tobacco: Never Used  . Alcohol Use: Yes     Comment: ocassionally    Review of Systems  Constitutional: Negative for fever and chills.  HENT: Positive for congestion and rhinorrhea.   Respiratory: Positive for cough and shortness of breath (Baseline).   Cardiovascular: Positive for leg swelling. Negative for chest pain.  Gastrointestinal: Positive for nausea. Negative for vomiting, abdominal pain, diarrhea, constipation and blood in stool.  Genitourinary: Positive for dysuria and frequency.  Musculoskeletal: Positive for back pain, joint swelling and myalgias.  Neurological: Positive for weakness and light-headedness.  All other systems reviewed and are negative.   Allergies  Aspirin  Home Medications   Current Outpatient Rx  Name  Route  Sig  Dispense  Refill  . ALPRAZolam (XANAX XR) 2 MG 24 hr tablet   Oral   Take 1 tablet (2 mg total) by mouth every morning.   30 tablet   5   . ALPRAZolam (XANAX) 0.5 MG tablet   Oral   Take 1 tablet (0.5 mg total) by mouth at bedtime as needed for sleep.   30 tablet   5   . atenolol (TENORMIN) 100 MG tablet   Oral   Take 1 tablet (100 mg total) by mouth daily.   90 tablet   0     PATIENT IS DUE FOR AN  OFFICE VISIT. LAST SEEN 3/20 ...   . clopidogrel (PLAVIX) 75 MG tablet   Oral   Take 1 tablet (75 mg total) by mouth daily.   90 tablet   0     PATIENT IS DUE FOR AN OFFICE VISIT. LAST SEEN 3/20 ...   . DULoxetine (CYMBALTA) 60 MG capsule      TAKE 1 CAPSULE (60 MG TOTAL) BY MOUTH DAILY.   90 capsule   0   . finasteride (PROSCAR) 5 MG tablet      TAKE 1 TABLET (5 MG TOTAL) BY MOUTH DAILY.   90 tablet   2   . LEVITRA 20 MG tablet      TAKE 1 TABLET (20 MG TOTAL) BY MOUTH DAILY AS NEEDED.   10 tablet   0   . NITROSTAT 0.4 MG SL tablet      PLACE 1 TABLET (0.4 MG TOTAL) UNDER THE TONGUE EVERY 5 (FIVE) MINUTES AS NEEDED.   25 tablet   0   . omeprazole (PRILOSEC) 40 MG capsule      TAKE 1 CAPSULE (40 MG TOTAL) BY MOUTH DAILY.   90 capsule   2   . simvastatin (ZOCOR) 80 MG tablet   Oral   Take 1 tablet (80 mg total) by mouth at bedtime.   90 tablet   0     PATIENT IS DUE FOR AN OFFICE VISIT. LAST SEEN 3/20 ...   . Tamsulosin HCl (FLOMAX) 0.4 MG CAPS   Oral   Take 1 capsule (0.4 mg total) by mouth daily.   60 capsule   5    Triage Vitals: BP 169/95  Pulse 94  Temp(Src) 98 F (36.7 C) (Oral)  Resp 18  Ht 5\' 6"  (1.676 m)  Wt 240 lb (108.863 kg)  BMI 38.76 kg/m2  SpO2 96%  Physical Exam  Nursing note and vitals reviewed. Constitutional: He appears well-developed and well-nourished. No distress.  HENT:  Head: Normocephalic and atraumatic.  Neck: Neck supple.  Cardiovascular: Normal rate, regular rhythm and intact distal pulses.   Pulmonary/Chest: Effort normal and breath sounds normal. No respiratory distress. He has no wheezes. He has no rales.  Abdominal: Soft. He exhibits no distension and no mass. There is no tenderness. There is no rebound and no guarding.  Musculoskeletal: He exhibits edema.  Bilateral lower extremity pitting edema to the knees, nontender.  L>R  Neurological: He is alert. He exhibits normal muscle tone.  Skin: He is not  diaphoretic.  Psychiatric: He has a normal mood and affect.    ED Course  Procedures (including critical care time)  DIAGNOSTIC STUDIES: Oxygen Saturation is 96% on room air, normal by my interpretation.  COORDINATION OF CARE:  12:09 AM- Discussed treatment plan with patient which includes blood test and a UA, and the patient agreed to the plan.   Labs Review Labs Reviewed  CBC WITH DIFFERENTIAL - Abnormal; Notable for the following:    RBC 4.15 (*)    Hemoglobin 12.7 (*)    HCT 38.1 (*)    All other components within normal limits  COMPREHENSIVE METABOLIC PANEL - Abnormal; Notable for the following:    Sodium 133 (*)    GFR calc non Af Amer 88 (*)    All other components within normal limits  URINALYSIS, ROUTINE W REFLEX MICROSCOPIC - Abnormal; Notable for the following:    APPearance CLOUDY (*)    All other components within normal limits  PRO B NATRIURETIC PEPTIDE  TROPONIN I   Imaging Review Dg Chest 2 View  05/14/2013   CLINICAL DATA:  Generalized body aches  EXAM: CHEST  2 VIEW  COMPARISON:  DG CHEST 1V PORT dated 09/08/2012  FINDINGS: Normal cardiac silhouette. Lungs are hyperinflated. No effusion, infiltrate, pneumothorax. Degenerative osteophytosis of the thoracic spine.  IMPRESSION: No acute cardiopulmonary process.   Electronically Signed   By: Genevive Bi M.D.   On: 05/14/2013 01:06    EKG Interpretation     Ventricular Rate:  91 PR Interval:  135 QRS Duration: 125 QT Interval:  382 QTC Calculation: 470 R Axis:   91 Text Interpretation:  Sinus rhythm RBBB and LPFB No significant change since last tracing           12:43 AM Discussed pt with Dr Norlene Campbell.   MDM   1. Peripheral edema   2. Medication refill   3. Urinary frequency   4. Hypertension    Patient with peripheral edema which he has had in the past and has taken Lasix in the past. Patient given Lasix here. There is no evidence of heart failure. EKG is unchanged, troponin is  negative. Patient has not had any chest pain or shortness of breath. He came to the emergency department for lightheadedness and nausea which he states he has had in the past several days as well and improved with eating and meal. 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  Patient given dosages of his medications in the emergency department as he mentions that he is out of money currently and will not have money until next week. He has been strongly encouraged to follow up with Dr. Jilda Panda. I have provided him prescriptions of most of his medications for the next 10 days in order to allow him time for an appointment.  Labs and urinalysis unremarkable. Chest x-ray negative.  Discussed result, findings, treatment, and follow up  with patient.  Pt given return precautions.  Pt verbalizes understanding and agrees with plan.      I personally performed the services described in this documentation, which was scribed in my presence. The recorded information has been reviewed and is accurate.    Trixie Dredge, PA-C 05/14/13 [phone]

## 2013-05-13 NOTE — ED Notes (Signed)
Pt states that since he got back from [place] he has felt more general body aches and increased swelling in legs.  Thought it was jet lag.  No n/v/d.  No abdominal pain.  No fever.

## 2013-05-14 ENCOUNTER — Emergency Department (HOSPITAL_COMMUNITY): Payer: Medicare PPO

## 2013-05-14 LAB — CBC WITH DIFFERENTIAL/PLATELET
Eosinophils Absolute: 0.1 10*3/uL (ref 0.0–0.7)
Eosinophils Relative: 2 % (ref 0–5)
HCT: 38.1 % — ABNORMAL LOW (ref 39.0–52.0)
Hemoglobin: 12.7 g/dL — ABNORMAL LOW (ref 13.0–17.0)
Lymphs Abs: 1.2 10*3/uL (ref 0.7–4.0)
MCH: 30.6 pg (ref 26.0–34.0)
MCHC: 33.3 g/dL (ref 30.0–36.0)
MCV: 91.8 fL (ref 78.0–100.0)
Monocytes Absolute: 0.5 10*3/uL (ref 0.1–1.0)
Monocytes Relative: 8 % (ref 3–12)
Neutrophils Relative %: 71 % (ref 43–77)
RBC: 4.15 MIL/uL — ABNORMAL LOW (ref 4.22–5.81)

## 2013-05-14 LAB — URINALYSIS, ROUTINE W REFLEX MICROSCOPIC
Ketones, ur: NEGATIVE mg/dL
Leukocytes, UA: NEGATIVE
Nitrite: NEGATIVE
Protein, ur: NEGATIVE mg/dL
pH: 6 (ref 5.0–8.0)

## 2013-05-14 LAB — COMPREHENSIVE METABOLIC PANEL
Alkaline Phosphatase: 61 U/L (ref 39–117)
BUN: 13 mg/dL (ref 6–23)
Creatinine, Ser: 0.88 mg/dL (ref 0.50–1.35)
GFR calc Af Amer: >90 mL/min (ref >90)
Glucose, Bld: 93 mg/dL (ref 70–99)
Potassium: 3.8 mEq/L (ref 3.5–5.1)
Total Protein: 6.8 g/dL (ref 6.0–8.3)

## 2013-05-14 MED ORDER — FINASTERIDE 5 MG PO TABS
5.0000 mg | ORAL_TABLET | Freq: Every day | ORAL | Status: DC
  Administered 2013-05-14: 5 mg via ORAL
  Filled 2013-05-14: qty 1

## 2013-05-14 MED ORDER — ATENOLOL 100 MG PO TABS: 100.0000 mg | ORAL_TABLET | Freq: Every day | ORAL | Status: DC

## 2013-05-14 MED ORDER — CLOPIDOGREL BISULFATE 75 MG PO TABS
75.0000 mg | ORAL_TABLET | Freq: Once | ORAL | Status: AC
  Administered 2013-05-14: 75 mg via ORAL
  Filled 2013-05-14: qty 1

## 2013-05-14 MED ORDER — CLOPIDOGREL BISULFATE 75 MG PO TABS: 75.0000 mg | ORAL_TABLET | Freq: Every day | ORAL | Status: DC

## 2013-05-14 MED ORDER — SIMVASTATIN 80 MG PO TABS: 80.0000 mg | ORAL_TABLET | Freq: Every day | ORAL | Status: DC

## 2013-05-14 MED ORDER — FUROSEMIDE 40 MG PO TABS
40.0000 mg | ORAL_TABLET | Freq: Once | ORAL | Status: AC
  Administered 2013-05-14: 40 mg via ORAL
  Filled 2013-05-14: qty 1

## 2013-05-14 MED ORDER — DULOXETINE HCL 60 MG PO CPEP: ORAL_CAPSULE | ORAL | Status: DC

## 2013-05-14 MED ORDER — TAMSULOSIN HCL 0.4 MG PO CAPS
0.4000 mg | ORAL_CAPSULE | Freq: Once | ORAL | Status: AC
  Administered 2013-05-14: 0.4 mg via ORAL
  Filled 2013-05-14: qty 1

## 2013-05-14 MED ORDER — ATENOLOL 100 MG PO TABS
100.0000 mg | ORAL_TABLET | Freq: Once | ORAL | Status: AC
  Administered 2013-05-14: 100 mg via ORAL
  Filled 2013-05-14 (×2): qty 1

## 2013-05-14 MED ORDER — DULOXETINE HCL 60 MG PO CPEP
60.0000 mg | ORAL_CAPSULE | Freq: Once | ORAL | Status: AC
  Administered 2013-05-14: 60 mg via ORAL
  Filled 2013-05-14: qty 1

## 2013-05-14 NOTE — ED Provider Notes (Signed)
Medical screening examination/treatment/procedure(s) were performed by non-physician practitioner and as supervising physician I was immediately available for consultation/collaboration.  EKG Interpretation     Ventricular Rate:  91 PR Interval:  135 QRS Duration: 125 QT Interval:  382 QTC Calculation: 470 R Axis:   91 Text Interpretation:  Sinus rhythm RBBB and LPFB No significant change since last tracing             Olivia Mackie, MD 05/14/13 [phone]

## 2013-05-21 ENCOUNTER — Encounter (HOSPITAL_COMMUNITY): Payer: Self-pay | Admitting: Emergency Medicine

## 2013-05-21 DIAGNOSIS — F411 Generalized anxiety disorder: Secondary | ICD-10-CM | POA: Insufficient documentation

## 2013-05-21 DIAGNOSIS — F329 Major depressive disorder, single episode, unspecified: Secondary | ICD-10-CM | POA: Insufficient documentation

## 2013-05-21 DIAGNOSIS — Z8619 Personal history of other infectious and parasitic diseases: Secondary | ICD-10-CM | POA: Insufficient documentation

## 2013-05-21 DIAGNOSIS — E785 Hyperlipidemia, unspecified: Secondary | ICD-10-CM | POA: Insufficient documentation

## 2013-05-21 DIAGNOSIS — F3289 Other specified depressive episodes: Secondary | ICD-10-CM | POA: Insufficient documentation

## 2013-05-21 DIAGNOSIS — N138 Other obstructive and reflux uropathy: Secondary | ICD-10-CM | POA: Insufficient documentation

## 2013-05-21 DIAGNOSIS — I2510 Atherosclerotic heart disease of native coronary artery without angina pectoris: Secondary | ICD-10-CM | POA: Insufficient documentation

## 2013-05-21 DIAGNOSIS — Z87891 Personal history of nicotine dependence: Secondary | ICD-10-CM | POA: Insufficient documentation

## 2013-05-21 DIAGNOSIS — Z79899 Other long term (current) drug therapy: Secondary | ICD-10-CM | POA: Insufficient documentation

## 2013-05-21 DIAGNOSIS — I10 Essential (primary) hypertension: Secondary | ICD-10-CM | POA: Insufficient documentation

## 2013-05-21 DIAGNOSIS — N401 Enlarged prostate with lower urinary tract symptoms: Secondary | ICD-10-CM | POA: Insufficient documentation

## 2013-05-21 DIAGNOSIS — Z7902 Long term (current) use of antithrombotics/antiplatelets: Secondary | ICD-10-CM | POA: Insufficient documentation

## 2013-05-21 DIAGNOSIS — K219 Gastro-esophageal reflux disease without esophagitis: Secondary | ICD-10-CM | POA: Insufficient documentation

## 2013-05-21 NOTE — ED Notes (Signed)
Pt states that he feels like he needs to urinate frequently but is only able to go a "few drops" at a time; pt states that he has been having trouble with his prostate and was seen a few days ago for a similar thing.

## 2013-05-22 ENCOUNTER — Emergency Department (HOSPITAL_COMMUNITY)
Admission: EM | Admit: 2013-05-22 | Discharge: 2013-05-22 | Disposition: A | Discharge status: Home / Self Care | Payer: Medicare PPO | Attending: Emergency Medicine | Admitting: Emergency Medicine

## 2013-05-22 DIAGNOSIS — N400 Enlarged prostate without lower urinary tract symptoms: Secondary | ICD-10-CM

## 2013-05-22 LAB — URINALYSIS, ROUTINE W REFLEX MICROSCOPIC
Hgb urine dipstick: NEGATIVE
Ketones, ur: >80 mg/dL — AB
Nitrite: NEGATIVE
Protein, ur: NEGATIVE mg/dL
Urobilinogen, UA: 1 mg/dL (ref 0.0–1.0)

## 2013-05-22 LAB — POCT I-STAT, CHEM 8
Calcium, Ion: 1.09 mmol/L — ABNORMAL LOW (ref 1.13–1.30)
Creatinine, Ser: 1.1 mg/dL (ref 0.50–1.35)
Glucose, Bld: 71 mg/dL (ref 70–99)
HCT: 44 % (ref 39.0–52.0)
Hemoglobin: 15 g/dL (ref 13.0–17.0)
Potassium: 4.3 mEq/L (ref 3.5–5.1)
TCO2: 25 mmol/L (ref 0–100)

## 2013-05-22 MED ORDER — FINASTERIDE 5 MG PO TABS
5.0000 mg | ORAL_TABLET | Freq: Every day | ORAL | Status: DC
  Administered 2013-05-22: 5 mg via ORAL
  Filled 2013-05-22: qty 1

## 2013-05-22 MED ORDER — TAMSULOSIN HCL 0.4 MG PO CAPS: 0.4000 mg | ORAL_CAPSULE | Freq: Every day | ORAL | Status: DC

## 2013-05-22 MED ORDER — FINASTERIDE 5 MG PO TABS: 5.0000 mg | ORAL_TABLET | Freq: Every day | ORAL | Status: DC

## 2013-05-22 MED ORDER — TAMSULOSIN HCL 0.4 MG PO CAPS
0.4000 mg | ORAL_CAPSULE | Freq: Every day | ORAL | Status: DC
  Administered 2013-05-22: 0.4 mg via ORAL
  Filled 2013-05-22: qty 1

## 2013-05-22 NOTE — Progress Notes (Signed)
   CARE MANAGEMENT ED NOTE 05/22/2013  Patient:  [name] [name],[name] [name]   Account Number:  [id number]  Date Initiated:  05/22/2013  Documentation initiated by:  Edd Arbour  Subjective/Objective Assessment:   24 male [employer] medic[employer] choice ppo who was referred to ED CM by ED SW for medication assistance and community resources.  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 Reports friends are retired but not at home.  Pt reports living in his car.  Pt voiced understanding of cm resources & repeated back to CM the instructions provided 1) churches for financial assist 2) [facility] for homeless, med, food, housing resources 3) encouraged to seek friends     Action/Plan:   CM reviewed EPIC notes Cm spoke with ED SW CM spoke with pt Discussed local [place] churches to assist with financial needs.  Cm discussed not being able to assist with medications throuhg [facility] CM  because he has active coverage   Action/Plan Detail:   Reivewed [employer] [facility]), housing, Affordable care act (pt repeatedly spoke on "obamacare" "injustices of the republicians" Cm reviewed DSS and local health department SW offered  pt a bus pass   Anticipated DC Date:  05/22/2013     Status Recommendation to Physician:   Result of Recommendation:    Other ED Services  Consult Working Plan   In-house referral  Clinical Social Worker   DC Associate Professor  Other  Outpatient Services - Pt will follow up    Choice offered to / List presented to:            Status of service:  Completed, signed off  ED Comments:   ED Comments Detail:  CM walked out  with pt to direct him to close [employer] & gas station

## 2013-05-22 NOTE — ED Provider Notes (Addendum)
CSN: [id number]     Arrival date & time 05/21/13  2211 History   First MD Initiated Contact with Patient 05/22/13 0102     Chief Complaint  Patient presents with  . Urinary Retention    HPI Pt was travelling out of the country and stayed longer than he anticipated.  He ran out of his medications after four weeks and was there for 6 weeks.  Pt has history of BPH and ran out of this medication.  He has not taken his flomax medication for three weeks.  He feels like he is having urinary issues related to that.  He has an urge to urinate but has not been able to to.  He denies fever or vomiting.  Past Medical History  Diagnosis Date  . Edema   . Dermatophytosis of groin and perianal area   . CAD (coronary artery disease)     cath '06, repeat '08 non-obstructive disease  . GERD (gastroesophageal reflux disease)   . Hyperlipidemia   . Allergy   . History of prostatitis   . History of BPH   . Hypertension   . Anxiety   . Depression    Past Surgical History  Procedure Laterality Date  . Adenoidectomy    . Tonsillectomy     Family History  Problem Relation Age of Onset  . Stroke Mother   . Coronary artery disease Father   . Heart attack Father   . Cancer Neg Hx     colon or prostate cancer   History  Substance Use Topics  . Smoking status: Former Smoker    Types: Cigars  . Smokeless tobacco: Never Used  . Alcohol Use: Yes     Comment: ocassionally    Review of Systems  Allergies  Aspirin  Home Medications   Current Outpatient Rx  Name  Route  Sig  Dispense  Refill  . ALPRAZolam (XANAX XR) 2 MG 24 hr tablet   Oral   Take 1 tablet (2 mg total) by mouth every morning.   30 tablet   5   . ALPRAZolam (XANAX) 0.5 MG tablet   Oral   Take 1 tablet (0.5 mg total) by mouth at bedtime as needed for sleep.   30 tablet   5   . atenolol (TENORMIN) 100 MG tablet   Oral   Take 1 tablet (100 mg total) by mouth daily.   10 tablet   0     PATIENT IS DUE FOR AN OFFICE  VISIT. LAST SEEN 3/20 ...   . clopidogrel (PLAVIX) 75 MG tablet   Oral   Take 1 tablet (75 mg total) by mouth daily.   10 tablet   0     PATIENT IS DUE FOR AN OFFICE VISIT. LAST SEEN 3/20 ...   . DULoxetine (CYMBALTA) 60 MG capsule      TAKE 1 CAPSULE (60 MG TOTAL) BY MOUTH DAILY.   10 capsule   0   . LEVITRA 20 MG tablet      TAKE 1 TABLET (20 MG TOTAL) BY MOUTH DAILY AS NEEDED.   10 tablet   0   . NITROSTAT 0.4 MG SL tablet      PLACE 1 TABLET (0.4 MG TOTAL) UNDER THE TONGUE EVERY 5 (FIVE) MINUTES AS NEEDED.   25 tablet   0   . omeprazole (PRILOSEC) 40 MG capsule      TAKE 1 CAPSULE (40 MG TOTAL) BY MOUTH DAILY.   90 capsule  2   . simvastatin (ZOCOR) 80 MG tablet   Oral   Take 1 tablet (80 mg total) by mouth at bedtime.   10 tablet   0     PATIENT IS DUE FOR AN OFFICE VISIT. LAST SEEN 3/20 ...   . finasteride (PROSCAR) 5 MG tablet   Oral   Take 1 tablet (5 mg total) by mouth daily.   90 tablet   0   . tamsulosin (FLOMAX) 0.4 MG CAPS capsule   Oral   Take 1 capsule (0.4 mg total) by mouth daily.   60 capsule   0    BP 130/80  Pulse 86  Temp(Src) 98.5 F (36.9 C) (Oral)  Resp 20  SpO2 99% Physical Exam  Nursing note and vitals reviewed. Constitutional: He appears well-developed and well-nourished. No distress.  HENT:  Head: Normocephalic and atraumatic.  Right Ear: External ear normal.  Left Ear: External ear normal.  Eyes: Conjunctivae are normal. Right eye exhibits no discharge. Left eye exhibits no discharge. No scleral icterus.  Neck: Neck supple. No tracheal deviation present.  Cardiovascular: Normal rate, regular rhythm and intact distal pulses.   Pulmonary/Chest: Effort normal and breath sounds normal. No stridor. No respiratory distress. He has no wheezes. He has no rales.  Abdominal: Soft. Bowel sounds are normal. He exhibits no distension. There is no tenderness. There is no rebound and no guarding.  Musculoskeletal: He exhibits  edema (trace). He exhibits no tenderness.  Neurological: He is alert. He has normal strength. No sensory deficit. Cranial nerve deficit:  no gross defecits noted. He exhibits normal muscle tone. He displays no seizure activity. Coordination normal.  Skin: Skin is warm and dry. No rash noted.  Psychiatric: He has a normal mood and affect.    ED Course  Procedures (including critical care time) Labs Review Labs Reviewed  URINALYSIS, ROUTINE W REFLEX MICROSCOPIC - Abnormal; Notable for the following:    Color, Urine AMBER (*)    Specific Gravity, Urine 1.036 (*)    Bilirubin Urine MODERATE (*)    Ketones, ur >80 (*)    All other components within normal limits  POCT I-STAT, CHEM 8 - Abnormal; Notable for the following:    Calcium, Ion 1.09 (*)    All other components within normal limits   Imaging Review No results found.  EKG Interpretation   None     Bladder scan 65-78cc    MDM   1. BPH (benign prostatic hyperplasia)    Pt given an RX for his medications.  He does not have urinary retention, infection or renal failures    Celene Kras, MD 05/22/13 0230  Celene Kras, MD 05/22/13 [phone]

## 2013-05-22 NOTE — Progress Notes (Signed)
csw received consult for pt, pt in the waiting room after being discharged. Pt stated he needed cash for gas, medication assistance, and a place to stay. Pt shared he's been living in his car. CSW provided pt with bus pass, shelter information. CSW also discussed with RN CM who met with pt to offer other financial resources, including talking to local churches. Pt states, "I might as well go back to [place], there's more freebies there." Patient stated he had gone to [employer] to get a loan until his check came in, and then directed patient back here for coupons for housing. CSW explained that [place] does not have any coupons for housing. Pt plans to follow up with resources provided. .No further Clinical Social Work needs, signing off.    Frutoso Schatz [phone]  ED CSW 05/22/2013 1219pm

## 2013-05-22 NOTE — ED Notes (Signed)
Bladder scan 65-78cc

## 2013-05-23 ENCOUNTER — Other Ambulatory Visit: Payer: Self-pay | Admitting: Internal Medicine

## 2013-05-23 NOTE — Progress Notes (Signed)
[facility] ED CM called to attempt to follow up with pt Dialed [phone] listed in [facility] as home number Automated voice message states this number is for [facility] Dialed [phone] and received an automated voice message stating number only allows outgoing calls not incoming calls. [facility] ED CM unable to follow up with pt CM signing off

## 2013-05-26 ENCOUNTER — Other Ambulatory Visit: Payer: Self-pay | Admitting: Internal Medicine

## 2013-07-19 ENCOUNTER — Telehealth: Payer: Self-pay | Admitting: *Deleted

## 2013-07-19 NOTE — Telephone Encounter (Signed)
[place] [facility] and would like a refill on his medication and [facility] [phone] [place]. Please advise  Xanax 0.5mg  Xanax 2mg  Flomax 0.4 Proscar 5mg  Cymbalta 60mg 

## 2013-07-20 ENCOUNTER — Telehealth: Payer: Self-pay | Admitting: Internal Medicine

## 2013-07-20 NOTE — Telephone Encounter (Signed)
Ok one month only - will need to find a doctor near where he lives.

## 2013-07-20 NOTE — Telephone Encounter (Signed)
Patient called to check status of  Xanax 0.5mg   Xanax 2mg   Flomax 0.4  Proscar 5mg   Cymbalta 60mg   Stated he got a call yesterday stating [facility] He called them and they hadnt received anything

## 2013-07-20 NOTE — Telephone Encounter (Signed)
See previous message - refills approved for one month and instructions to establish with a doctor where he lives.

## 2013-07-24 MED ORDER — ALPRAZOLAM 0.5 MG PO TABS: 0.5000 mg | ORAL_TABLET | Freq: Every evening | ORAL | Status: DC | PRN

## 2013-07-24 MED ORDER — ALPRAZOLAM ER 2 MG PO TB24: 2.0000 mg | ORAL_TABLET | ORAL | Status: DC

## 2013-07-24 MED ORDER — FINASTERIDE 5 MG PO TABS: 5.0000 mg | ORAL_TABLET | Freq: Every day | ORAL | Status: DC

## 2013-07-24 MED ORDER — DULOXETINE HCL 60 MG PO CPEP: ORAL_CAPSULE | ORAL | Status: DC

## 2013-07-24 MED ORDER — TAMSULOSIN HCL 0.4 MG PO CAPS: 0.4000 mg | ORAL_CAPSULE | Freq: Every day | ORAL | Status: DC

## 2013-07-24 NOTE — Telephone Encounter (Signed)
DONE

## 2013-07-25 NOTE — Telephone Encounter (Signed)
[name] [name] at [facility] [place] [facility] called and left a message on the triage line requesting to speak with someone to verify the patient's prescriptions for Aprazolam that were sent to them. She can be reached back at [phone]. Please advise.

## 2013-07-25 NOTE — Telephone Encounter (Signed)
I verified with [facility] that patient is on 2 different prescriptions of Alprazolam

## 2015-03-19 ENCOUNTER — Emergency Department (HOSPITAL_COMMUNITY): Payer: Medicare PPO

## 2015-03-19 ENCOUNTER — Inpatient Hospital Stay (HOSPITAL_COMMUNITY)
Admission: EM | Admit: 2015-03-19 | Discharge: 2015-03-27 | DRG: 287 | Disposition: A | Discharge status: Other Acute Inpatient Hospital | Payer: Medicare PPO | Attending: Cardiology | Admitting: Cardiology

## 2015-03-19 ENCOUNTER — Encounter (HOSPITAL_COMMUNITY): Payer: Self-pay | Admitting: General Practice

## 2015-03-19 DIAGNOSIS — Z79899 Other long term (current) drug therapy: Secondary | ICD-10-CM

## 2015-03-19 DIAGNOSIS — I4581 Long QT syndrome: Secondary | ICD-10-CM | POA: Diagnosis present

## 2015-03-19 DIAGNOSIS — Z9119 Patient's noncompliance with other medical treatment and regimen: Secondary | ICD-10-CM | POA: Diagnosis present

## 2015-03-19 DIAGNOSIS — R079 Chest pain, unspecified: Secondary | ICD-10-CM

## 2015-03-19 DIAGNOSIS — Z9861 Coronary angioplasty status: Secondary | ICD-10-CM

## 2015-03-19 DIAGNOSIS — Z5900 Homelessness unspecified: Secondary | ICD-10-CM

## 2015-03-19 DIAGNOSIS — F331 Major depressive disorder, recurrent, moderate: Secondary | ICD-10-CM | POA: Diagnosis present

## 2015-03-19 DIAGNOSIS — F341 Dysthymic disorder: Secondary | ICD-10-CM | POA: Diagnosis present

## 2015-03-19 DIAGNOSIS — Z955 Presence of coronary angioplasty implant and graft: Secondary | ICD-10-CM

## 2015-03-19 DIAGNOSIS — R7989 Other specified abnormal findings of blood chemistry: Secondary | ICD-10-CM | POA: Diagnosis present

## 2015-03-19 DIAGNOSIS — I25110 Atherosclerotic heart disease of native coronary artery with unstable angina pectoris: Principal | ICD-10-CM | POA: Diagnosis present

## 2015-03-19 DIAGNOSIS — S3739XA Other injury of urethra, initial encounter: Secondary | ICD-10-CM | POA: Diagnosis not present

## 2015-03-19 DIAGNOSIS — I4510 Unspecified right bundle-branch block: Secondary | ICD-10-CM | POA: Diagnosis present

## 2015-03-19 DIAGNOSIS — I10 Essential (primary) hypertension: Secondary | ICD-10-CM | POA: Diagnosis present

## 2015-03-19 DIAGNOSIS — N400 Enlarged prostate without lower urinary tract symptoms: Secondary | ICD-10-CM | POA: Diagnosis present

## 2015-03-19 DIAGNOSIS — Z7902 Long term (current) use of antithrombotics/antiplatelets: Secondary | ICD-10-CM

## 2015-03-19 DIAGNOSIS — Z87891 Personal history of nicotine dependence: Secondary | ICD-10-CM

## 2015-03-19 DIAGNOSIS — I200 Unstable angina: Secondary | ICD-10-CM | POA: Diagnosis not present

## 2015-03-19 DIAGNOSIS — R931 Abnormal findings on diagnostic imaging of heart and coronary circulation: Secondary | ICD-10-CM | POA: Insufficient documentation

## 2015-03-19 DIAGNOSIS — Y92239 Unspecified place in hospital as the place of occurrence of the external cause: Secondary | ICD-10-CM

## 2015-03-19 DIAGNOSIS — R310 Gross hematuria: Secondary | ICD-10-CM | POA: Diagnosis not present

## 2015-03-19 DIAGNOSIS — Z6830 Body mass index (BMI) 30.0-30.9, adult: Secondary | ICD-10-CM

## 2015-03-19 DIAGNOSIS — Y658 Other specified misadventures during surgical and medical care: Secondary | ICD-10-CM | POA: Diagnosis not present

## 2015-03-19 DIAGNOSIS — N401 Enlarged prostate with lower urinary tract symptoms: Secondary | ICD-10-CM | POA: Diagnosis present

## 2015-03-19 DIAGNOSIS — F419 Anxiety disorder, unspecified: Secondary | ICD-10-CM | POA: Diagnosis present

## 2015-03-19 DIAGNOSIS — D649 Anemia, unspecified: Secondary | ICD-10-CM | POA: Diagnosis present

## 2015-03-19 DIAGNOSIS — N1330 Unspecified hydronephrosis: Secondary | ICD-10-CM | POA: Diagnosis present

## 2015-03-19 DIAGNOSIS — R778 Other specified abnormalities of plasma proteins: Secondary | ICD-10-CM | POA: Diagnosis present

## 2015-03-19 DIAGNOSIS — I252 Old myocardial infarction: Secondary | ICD-10-CM

## 2015-03-19 DIAGNOSIS — E876 Hypokalemia: Secondary | ICD-10-CM | POA: Diagnosis present

## 2015-03-19 DIAGNOSIS — I2510 Atherosclerotic heart disease of native coronary artery without angina pectoris: Secondary | ICD-10-CM

## 2015-03-19 DIAGNOSIS — E785 Hyperlipidemia, unspecified: Secondary | ICD-10-CM | POA: Diagnosis present

## 2015-03-19 DIAGNOSIS — I214 Non-ST elevation (NSTEMI) myocardial infarction: Secondary | ICD-10-CM

## 2015-03-19 DIAGNOSIS — E669 Obesity, unspecified: Secondary | ICD-10-CM | POA: Diagnosis present

## 2015-03-19 DIAGNOSIS — K219 Gastro-esophageal reflux disease without esophagitis: Secondary | ICD-10-CM | POA: Diagnosis present

## 2015-03-19 DIAGNOSIS — R45851 Suicidal ideations: Secondary | ICD-10-CM | POA: Diagnosis not present

## 2015-03-19 DIAGNOSIS — R338 Other retention of urine: Secondary | ICD-10-CM | POA: Diagnosis not present

## 2015-03-19 HISTORY — DX: Acute myocardial infarction, unspecified: I21.9

## 2015-03-19 LAB — CBC
HCT: 36.3 % — ABNORMAL LOW (ref 39.0–52.0)
Hemoglobin: 12.3 g/dL — ABNORMAL LOW (ref 13.0–17.0)
MCH: 30.1 pg (ref 26.0–34.0)
MCHC: 33.9 g/dL (ref 30.0–36.0)
MCV: 89 fL (ref 78.0–100.0)
Platelets: 208 10*3/uL (ref 150–400)
RBC: 4.08 MIL/uL — AB (ref 4.22–5.81)
RDW: 13.5 % (ref 11.5–15.5)
WBC: 7.1 10*3/uL (ref 4.0–10.5)

## 2015-03-19 LAB — BASIC METABOLIC PANEL
Anion gap: 10 (ref 5–15)
BUN: 7 mg/dL (ref 6–20)
CALCIUM: 9.3 mg/dL (ref 8.9–10.3)
CO2: 30 mmol/L (ref 22–32)
CREATININE: 1.21 mg/dL (ref 0.61–1.24)
Chloride: 102 mmol/L (ref 101–111)
GFR calc non Af Amer: 60 mL/min — ABNORMAL LOW (ref >60)
Glucose, Bld: 112 mg/dL — ABNORMAL HIGH (ref 65–99)
Potassium: 2.5 mmol/L — CL (ref 3.5–5.1)
Sodium: 142 mmol/L (ref 135–145)

## 2015-03-19 LAB — PROTIME-INR
INR: 1.16 (ref 0.00–1.49)
Prothrombin Time: 15 seconds (ref 11.6–15.2)

## 2015-03-19 LAB — I-STAT TROPONIN, ED: TROPONIN I, POC: 0.02 ng/mL (ref 0.00–0.08)

## 2015-03-19 LAB — MAGNESIUM: Magnesium: 1.7 mg/dL (ref 1.7–2.4)

## 2015-03-19 LAB — TROPONIN I: TROPONIN I: 0.05 ng/mL — AB (ref <0.031)

## 2015-03-19 MED ORDER — SODIUM CHLORIDE 0.9 % IV SOLN: 250.0000 mL | INTRAVENOUS | Status: DC | PRN

## 2015-03-19 MED ORDER — ASPIRIN 81 MG PO CHEW
81.0000 mg | CHEWABLE_TABLET | ORAL | Status: AC
  Administered 2015-03-20: 81 mg via ORAL
  Filled 2015-03-19: qty 1

## 2015-03-19 MED ORDER — TAMSULOSIN HCL 0.4 MG PO CAPS
0.4000 mg | ORAL_CAPSULE | Freq: Every day | ORAL | Status: DC
  Administered 2015-03-19 – 2015-03-26 (×8): 0.4 mg via ORAL
  Filled 2015-03-19 (×8): qty 1

## 2015-03-19 MED ORDER — SODIUM CHLORIDE 0.9 % IJ SOLN: 3.0000 mL | INTRAMUSCULAR | Status: DC | PRN

## 2015-03-19 MED ORDER — ASPIRIN EC 81 MG PO TBEC
81.0000 mg | DELAYED_RELEASE_TABLET | Freq: Every day | ORAL | Status: DC
  Administered 2015-03-21 – 2015-03-27 (×7): 81 mg via ORAL
  Filled 2015-03-19 (×8): qty 1

## 2015-03-19 MED ORDER — SODIUM CHLORIDE 0.9 % WEIGHT BASED INFUSION: 1.0000 mL/kg/h | INTRAVENOUS | Status: DC

## 2015-03-19 MED ORDER — ACETAMINOPHEN 325 MG PO TABS
650.0000 mg | ORAL_TABLET | ORAL | Status: DC | PRN
  Administered 2015-03-20: 650 mg via ORAL
  Filled 2015-03-19: qty 2

## 2015-03-19 MED ORDER — ASPIRIN 300 MG RE SUPP: 300.0000 mg | RECTAL | Status: AC

## 2015-03-19 MED ORDER — METOPROLOL TARTRATE 25 MG PO TABS
50.0000 mg | ORAL_TABLET | Freq: Two times a day (BID) | ORAL | Status: DC
  Filled 2015-03-19: qty 2

## 2015-03-19 MED ORDER — HEPARIN (PORCINE) IN NACL 100-0.45 UNIT/ML-% IJ SOLN
1550.0000 [IU]/h | INTRAMUSCULAR | Status: DC
  Administered 2015-03-19: 1100 [IU]/h via INTRAVENOUS
  Administered 2015-03-20: 1550 [IU]/h via INTRAVENOUS
  Filled 2015-03-19 (×2): qty 250

## 2015-03-19 MED ORDER — ONDANSETRON HCL 4 MG/2ML IJ SOLN: 4.0000 mg | Freq: Four times a day (QID) | INTRAMUSCULAR | Status: DC | PRN

## 2015-03-19 MED ORDER — SODIUM CHLORIDE 0.9 % IJ SOLN
3.0000 mL | Freq: Two times a day (BID) | INTRAMUSCULAR | Status: DC
  Administered 2015-03-19: 3 mL via INTRAVENOUS

## 2015-03-19 MED ORDER — NITROGLYCERIN 0.4 MG SL SUBL: 0.4000 mg | SUBLINGUAL_TABLET | SUBLINGUAL | Status: DC | PRN

## 2015-03-19 MED ORDER — PANTOPRAZOLE SODIUM 40 MG PO TBEC
40.0000 mg | DELAYED_RELEASE_TABLET | Freq: Every day | ORAL | Status: DC
  Administered 2015-03-20 – 2015-03-27 (×8): 40 mg via ORAL
  Filled 2015-03-19 (×8): qty 1

## 2015-03-19 MED ORDER — POTASSIUM CHLORIDE 10 MEQ/100ML IV SOLN
10.0000 meq | INTRAVENOUS | Status: AC
  Administered 2015-03-19 (×3): 10 meq via INTRAVENOUS
  Filled 2015-03-19 (×3): qty 100

## 2015-03-19 MED ORDER — ASPIRIN 81 MG PO CHEW
324.0000 mg | CHEWABLE_TABLET | ORAL | Status: AC
  Administered 2015-03-19: 324 mg via ORAL
  Filled 2015-03-19: qty 4

## 2015-03-19 MED ORDER — FINASTERIDE 5 MG PO TABS
5.0000 mg | ORAL_TABLET | Freq: Every day | ORAL | Status: DC
  Administered 2015-03-19 – 2015-03-27 (×9): 5 mg via ORAL
  Filled 2015-03-19 (×9): qty 1

## 2015-03-19 MED ORDER — METOPROLOL TARTRATE 50 MG PO TABS
50.0000 mg | ORAL_TABLET | Freq: Two times a day (BID) | ORAL | Status: DC
  Administered 2015-03-19 – 2015-03-24 (×11): 50 mg via ORAL
  Filled 2015-03-19 (×10): qty 1

## 2015-03-19 MED ORDER — HEPARIN BOLUS VIA INFUSION
4000.0000 [IU] | Freq: Once | INTRAVENOUS | Status: AC
  Administered 2015-03-19: 4000 [IU] via INTRAVENOUS
  Filled 2015-03-19: qty 4000

## 2015-03-19 MED ORDER — SODIUM CHLORIDE 0.9 % WEIGHT BASED INFUSION
3.0000 mL/kg/h | INTRAVENOUS | Status: AC
Start: 2015-03-20 — End: 2015-03-20
  Administered 2015-03-20: 3 mL/kg/h via INTRAVENOUS

## 2015-03-19 MED ORDER — CLOPIDOGREL BISULFATE 75 MG PO TABS
75.0000 mg | ORAL_TABLET | Freq: Every day | ORAL | Status: DC
  Administered 2015-03-19 – 2015-03-27 (×9): 75 mg via ORAL
  Filled 2015-03-19 (×9): qty 1

## 2015-03-19 MED ORDER — POTASSIUM CHLORIDE CRYS ER 20 MEQ PO TBCR
40.0000 meq | EXTENDED_RELEASE_TABLET | Freq: Once | ORAL | Status: AC
  Administered 2015-03-19: 40 meq via ORAL
  Filled 2015-03-19: qty 2

## 2015-03-19 MED ORDER — ATORVASTATIN CALCIUM 40 MG PO TABS
40.0000 mg | ORAL_TABLET | Freq: Every day | ORAL | Status: DC
  Administered 2015-03-19: 40 mg via ORAL
  Filled 2015-03-19: qty 1

## 2015-03-19 NOTE — ED Notes (Signed)
Attempted report 

## 2015-03-19 NOTE — Progress Notes (Signed)
ANTICOAGULATION CONSULT NOTE - Initial Consult  Pharmacy Consult for heparin Indication: ACS / STEMI  Allergies  Allergen Reactions  . Aspirin     Patient states can take low dose aspirin     Patient Measurements: Height:  (167.6 cm) Weight: 210 lb (95.255 kg) IBW/kg (Calculated) : 63.8 Heparin Dosing Weight: 84.5 kg   Vital Signs: Temp: 97.9 F (36.6 C) (09/20 1328) Temp Source: Oral (09/20 1328) BP: 178/81 mmHg (09/20 1545) Pulse Rate: 76 (09/20 1545)  Labs:  Recent Labs  03/19/15 1343  HGB 12.3*  HCT 36.3*  PLT 208  CREATININE 1.21    Estimated Creatinine Clearance: 63.1 mL/min (by C-G formula based on Cr of 1.21).   Medical History: Past Medical History  Diagnosis Date  . Edema   . Dermatophytosis of groin and perianal area   . CAD (coronary artery disease)     cath '06, repeat '08 non-obstructive disease  . GERD (gastroesophageal reflux disease)   . Hyperlipidemia   . Allergy   . History of prostatitis   . History of BPH   . Hypertension   . Anxiety   . Depression     Assessment: 69 yo M presents on 9/20 with chest pain. Cards plans for cath tomorrow and consulted pharmacy for heparin. Hgb 12.3, plts wnl. No s/s of bleed. On plavix PTA.  Goal of Therapy:  Heparin level 0.3-0.7 units/ml Monitor platelets by anticoagulation protocol: Yes   Plan:  Give 4,000 unit heparin BOLUS Start heparin gtt at 1,100 units/hr Check 6 hr HL Monitor daily HL, CBC, s/s of bleed   Antwonette Feliz J 03/19/2015,4:49 PM

## 2015-03-19 NOTE — Interval H&P Note (Signed)
Cath Lab Visit (complete for each Cath Lab visit)  Clinical Evaluation Leading to the Procedure:   ACS: Yes.    Non-ACS:    Anginal Classification: CCS IV  Anti-ischemic medical therapy: Maximal Therapy (2 or more classes of medications)  Non-Invasive Test Results: No non-invasive testing performed  Prior CABG: No previous CABG      History and Physical Interval Note:  03/19/2015 6:11 PM  [name] [name]  has presented today for surgery, with the diagnosis of cp  The various methods of treatment have been discussed with the patient and family. After consideration of risks, benefits and other options for treatment, the patient has consented to  Procedure(s): Left Heart Cath and Coronary Angiography (N/A) as a surgical intervention .  The patient's history has been reviewed, patient examined, no change in status, stable for surgery.  I have reviewed the patient's chart and labs.  Questions were answered to the patient's satisfaction.     [name] [name]

## 2015-03-19 NOTE — ED Provider Notes (Signed)
CSN: [id number]     Arrival date & time 03/19/15  1319 History   First MD Initiated Contact with Patient 03/19/15 1340     Chief Complaint  Patient presents with  . Chest Pain     (Consider location/radiation/quality/duration/timing/severity/associated sxs/prior Treatment) HPI Patient presents to the emergency department with substernal chest pain that radiated to the left side of his chest and jaw intermittently since yesterday.  The patient states that he has had stents placed in the past.  He is on Plavix.  Patient denies shortness of breath, weakness, dizziness, headache, blurred vision, fever, cough, abdominal pain, nausea, vomiting, diaphoresis plated is near syncope or syncope.  The patient states that he does have some exertional increase in his chest pain Past Medical History  Diagnosis Date  . Edema   . Dermatophytosis of groin and perianal area   . CAD (coronary artery disease)     cath '06, repeat '08 non-obstructive disease  . GERD (gastroesophageal reflux disease)   . Hyperlipidemia   . Allergy   . History of prostatitis   . History of BPH   . Hypertension   . Anxiety   . Depression    Past Surgical History  Procedure Laterality Date  . Adenoidectomy    . Tonsillectomy     Family History  Problem Relation Age of Onset  . Stroke Mother   . Coronary artery disease Father   . Heart attack Father   . Cancer Neg Hx     colon or prostate cancer   Social History  Substance Use Topics  . Smoking status: Former Smoker    Types: Cigars  . Smokeless tobacco: Never Used  . Alcohol Use: Yes     Comment: ocassionally    Review of Systems  All other systems negative except as documented in the HPI. All pertinent positives and negatives as reviewed in the HPI.  Allergies  Aspirin  Home Medications   Prior to Admission medications   Medication Sig Start Date End Date Taking? Authorizing Provider  ALPRAZolam (ALPRAZOLAM XR) 2 MG 24 hr tablet Take 1 tablet (2 mg  total) by mouth every morning. 07/24/13   Jacques Navy, MD  ALPRAZolam Prudy Feeler) 0.5 MG tablet Take 1 tablet (0.5 mg total) by mouth at bedtime as needed for sleep. 07/24/13   Jacques Navy, MD  atenolol (TENORMIN) 100 MG tablet Take 1 tablet (100 mg total) by mouth daily. 05/14/13   Trixie Dredge, PA-C  atenolol (TENORMIN) 100 MG tablet TAKE 1 TABLET (100 MG TOTAL) BY MOUTH DAILY. 05/26/13   Jacques Navy, MD  clopidogrel (PLAVIX) 75 MG tablet Take 1 tablet (75 mg total) by mouth daily. 05/14/13   Trixie Dredge, PA-C  clopidogrel (PLAVIX) 75 MG tablet TAKE 1 TABLET (75 MG TOTAL) BY MOUTH DAILY. 05/26/13   Jacques Navy, MD  DULoxetine (CYMBALTA) 60 MG capsule TAKE 1 CAPSULE (60 MG TOTAL) BY MOUTH DAILY. 07/24/13   Jacques Navy, MD  finasteride (PROSCAR) 5 MG tablet Take 1 tablet (5 mg total) by mouth daily. 07/24/13   Jacques Navy, MD  LEVITRA 20 MG tablet TAKE 1 TABLET (20 MG TOTAL) BY MOUTH DAILY AS NEEDED. 11/22/12   Jacques Navy, MD  NITROSTAT 0.4 MG SL tablet PLACE 1 TABLET (0.4 MG TOTAL) UNDER THE TONGUE EVERY 5 (FIVE) MINUTES AS NEEDED. 03/21/13   Jacques Navy, MD  omeprazole (PRILOSEC) 40 MG capsule TAKE 1 CAPSULE (40 MG TOTAL) BY MOUTH DAILY. 08/22/12  Jacques Navy, MD  simvastatin (ZOCOR) 80 MG tablet Take 1 tablet (80 mg total) by mouth at bedtime. 05/14/13   Trixie Dredge, PA-C  simvastatin (ZOCOR) 80 MG tablet TAKE 1 TABLET (80 MG TOTAL) BY MOUTH AT BEDTIME. 05/26/13   Jacques Navy, MD  tamsulosin (FLOMAX) 0.4 MG CAPS capsule Take 1 capsule (0.4 mg total) by mouth daily. 07/24/13   Jacques Navy, MD   BP 147/79 mmHg  Pulse 69  Temp(Src) 97.9 F (36.6 C) (Oral)  Resp 13  Ht  (1.676 m)  Wt 210 lb (95.255 kg)  BMI 33.91 kg/m2  SpO2 97% Physical Exam  Constitutional: He is oriented to person, place, and time. He appears well-developed and well-nourished. No distress.  HENT:  Head: Normocephalic and atraumatic.  Mouth/Throat: Oropharynx is clear and  moist.  Eyes: Pupils are equal, round, and reactive to light.  Neck: Normal range of motion. Neck supple.  Cardiovascular: Normal rate, regular rhythm and normal heart sounds.  Exam reveals no gallop and no friction rub.   No murmur heard. Pulmonary/Chest: Effort normal and breath sounds normal. No respiratory distress.  Neurological: He is alert and oriented to person, place, and time. He exhibits normal muscle tone. Coordination normal.  Skin: Skin is warm and dry. No rash noted. No erythema.  Nursing note and vitals reviewed.   ED Course  Procedures (including critical care time) Labs Review Labs Reviewed  BASIC METABOLIC PANEL - Abnormal; Notable for the following:    Potassium 2.5 (*)    Glucose, Bld 112 (*)    GFR calc non Af Amer 60 (*)    All other components within normal limits  CBC - Abnormal; Notable for the following:    RBC 4.08 (*)    Hemoglobin 12.3 (*)    HCT 36.3 (*)    All other components within normal limits  I-STAT TROPOININ, ED    Imaging Review Dg Chest 2 View  03/19/2015   CLINICAL DATA:  Chest pain for 2 days.  EXAM: CHEST  2 VIEW  COMPARISON:  PA and lateral chest 05/14/2013.  FINDINGS: The lungs are clear. Heart size is normal. No pneumothorax or pleural effusion. No focal bony abnormality.  IMPRESSION: No acute disease.   Electronically Signed   By: Drusilla Kanner M.D.   On: 03/19/2015 14:17   I have personally reviewed and evaluated these images and lab results as part of my medical decision-making.   EKG Interpretation   Date/Time:  Tuesday March 19 2015 13:29:08 EDT Ventricular Rate:  70 PR Interval:  140 QRS Duration: 128 QT Interval:  493 QTC Calculation: 532 R Axis:   75 Text Interpretation:  Sinus rhythm Right bundle branch block ST  depression, consider ischemia, diffuse lds Minimal ST elevation, lateral  leads Ischemic changes compared to previous tracing Confirmed by NGUYEN,  EMILY ([id number]) on 03/19/2015 2:47:12 PM       Patient be admitted to the hospital for further evaluation and care of this chest pain.  This most likely cardiac in nature   Charlestine Night, PA-C 03/26/15 8119  Leta Baptist, MD 04/01/15 2240

## 2015-03-19 NOTE — H&P (Signed)
Patient ID: [name] [name] MRN: [id number], DOB/AGE: 1945-11-29   Admit date: 03/19/2015   Primary Physician: [name], MD Primary Cardiologist: Seen by Dr. Gala Romney in 2006  Pt. Profile:  [name] [name] is a 69 y.o. male with a history of  hypertension, hyperlipidemia, obesity, depression and CAD s/p recent DES to mRCA (6months ago in [facility]) who presents to [facility] today with chest pain.   [name] [name] was seen by Dr. Gala Romney in 2006 for chest pain and underwent left heart cardiac catheterization on11/02/2005 revealing a normal left main, a 50-60%/40-50% stenosis in the mid-LAD, diffuse disease in the left circumflex and OM-2, and a 40% lesion in the distal RCA. The LAD was wired using a Doppler flow wire revealing a FFR of 0.93 indicating no flow limiting stenosis. It was decided, at that point to medically manage him.   Since that time he has been back and forth between here and [facility]. 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 He apparently had a heart attack ~ 6 months ago and had a Promes DES to his mRCA by Dr. Claudette Laws at [facility]. He has been back in [place] for a few days and staying in a Motel and started having chest pain yesterday morning. It has been coming and going and is not related to exertion. It is associated with diaphoresis and nausea. It radiates to his jaw. He has been on ASA/Plavix but has not been complaint with all of his medications. About a month ago he ran out of money and stopped taking his medications due to a "depression". There is an allergy listed to ASA but he has been tolerating baby ASA fine. He has now been complaint for the past month. No SOB, orthopnea, PND or LE edema. Upon further questioning it turns out he may have had a stress test since his last heart cath/PCI which was reportedly okay.      Problem List  Past Medical History  Diagnosis Date  . Edema   . Dermatophytosis of groin and perianal area   . CAD (coronary artery  disease)     cath '06, repeat '08 non-obstructive disease  . GERD (gastroesophageal reflux disease)   . Hyperlipidemia   . Allergy   . History of prostatitis   . History of BPH   . Hypertension   . Anxiety   . Depression     Past Surgical History  Procedure Laterality Date  . Adenoidectomy    . Tonsillectomy       Allergies  Allergies  Allergen Reactions  . Aspirin     REACTION: causes hives and swelling     Home Medications  Prior to Admission medications   Medication Sig Start Date End Date Taking? Authorizing Provider  ALPRAZolam (ALPRAZOLAM XR) 2 MG 24 hr tablet Take 1 tablet (2 mg total) by mouth every morning. 07/24/13   Jacques Navy, MD  ALPRAZolam Prudy Feeler) 0.5 MG tablet Take 1 tablet (0.5 mg total) by mouth at bedtime as needed for sleep. 07/24/13   Jacques Navy, MD  atenolol (TENORMIN) 100 MG tablet Take 1 tablet (100 mg total) by mouth daily. 05/14/13   Trixie Dredge, PA-C  atenolol (TENORMIN) 100 MG tablet TAKE 1 TABLET (100 MG TOTAL) BY MOUTH DAILY. 05/26/13   Jacques Navy, MD  clopidogrel (PLAVIX) 75 MG tablet Take 1 tablet (75 mg total) by mouth daily. 05/14/13   Trixie Dredge, PA-C  clopidogrel (PLAVIX) 75 MG tablet  TAKE 1 TABLET (75 MG TOTAL) BY MOUTH DAILY. 05/26/13   Jacques Navy, MD  DULoxetine (CYMBALTA) 60 MG capsule TAKE 1 CAPSULE (60 MG TOTAL) BY MOUTH DAILY. 07/24/13   Jacques Navy, MD  finasteride (PROSCAR) 5 MG tablet Take 1 tablet (5 mg total) by mouth daily. 07/24/13   Jacques Navy, MD  LEVITRA 20 MG tablet TAKE 1 TABLET (20 MG TOTAL) BY MOUTH DAILY AS NEEDED. 11/22/12   Jacques Navy, MD  NITROSTAT 0.4 MG SL tablet PLACE 1 TABLET (0.4 MG TOTAL) UNDER THE TONGUE EVERY 5 (FIVE) MINUTES AS NEEDED. 03/21/13   Jacques Navy, MD  omeprazole (PRILOSEC) 40 MG capsule TAKE 1 CAPSULE (40 MG TOTAL) BY MOUTH DAILY. 08/22/12   Jacques Navy, MD  simvastatin (ZOCOR) 80 MG tablet Take 1 tablet (80 mg total) by mouth at bedtime. 05/14/13    Trixie Dredge, PA-C  simvastatin (ZOCOR) 80 MG tablet TAKE 1 TABLET (80 MG TOTAL) BY MOUTH AT BEDTIME. 05/26/13   Jacques Navy, MD  tamsulosin (FLOMAX) 0.4 MG CAPS capsule Take 1 capsule (0.4 mg total) by mouth daily. 07/24/13   Jacques Navy, MD    Family History  Family History  Problem Relation Age of Onset  . Stroke Mother   . Coronary artery disease Father   . Heart attack Father   . Cancer Neg Hx     colon or prostate cancer   Family Status  Relation Status Death Age  . Mother Deceased   . Father Deceased      Social History  Social History   Social History  . Marital Status: Married    Spouse Name: N/A  . Number of Children: N/A  . Years of Education: N/A   Occupational History  . Not on file.   Social History Main Topics  . Smoking status: Former Smoker    Types: Cigars  . Smokeless tobacco: Never Used  . Alcohol Use: Yes     Comment: ocassionally  . Drug Use: No  . Sexual Activity:    Partners: Female   Other Topics Concern  . 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  Physical Exam  Blood pressure 172/76, pulse 69, temperature 97.9 F (36.6 C), temperature source Oral, resp. rate 14, height  (1.676 m), weight 210 lb (95.255 kg), SpO2 98 %.  General: Pleasant, NAD Psych: Normal affect. Neuro: Alert and oriented X 3. Moves all extremities spontaneously. HEENT: Normal  Neck: Supple without bruits or JVD. Lungs:  Resp regular and unlabored, CTA. Heart: RRR no s3, s4, or murmurs. Abdomen: Soft, non-tender, non-distended, BS + x 4.  Extremities: No clubbing, cyanosis or edema. DP/PT/Radials 2+ and equal bilaterally.  Labs  No results for input(s): CKTOTAL, CKMB, TROPONINI in the last 72 hours. Lab  Results  Component Value Date   WBC 7.1 03/19/2015   HGB 12.3* 03/19/2015   HCT 36.3* 03/19/2015   MCV 89.0 03/19/2015   PLT 208 03/19/2015    Recent Labs Lab 03/19/15 1343  NA 142  K 2.5*  CL 102  CO2 30  BUN 7  CREATININE 1.21  CALCIUM 9.3  GLUCOSE 112*   Lab Results  Component Value Date   CHOL 192 07/21/2011  HDL 56.90 07/21/2011   LDLCALC 53 09/26/2009   TRIG 227.0* 07/21/2011   No results found for: DDIMER   Radiology/Studies  Dg Chest 2 View  03/19/2015   CLINICAL DATA:  Chest pain for 2 days.  EXAM: CHEST  2 VIEW  COMPARISON:  PA and lateral chest 05/14/2013.  FINDINGS: The lungs are clear. Heart size is normal. No pneumothorax or pleural effusion. No focal bony abnormality.  IMPRESSION: No acute disease.   Electronically Signed   By: Drusilla Kanner M.D.   On: 03/19/2015 14:17    ECG  HR 70 NSR, RBBB, QTc 532  ASSESSMENT AND PLAN [name] [name] is a 69 y.o. male with a history of  hypertension, hyperlipidemia, obesity, depression and CAD s/p recent DES to mRCA (6months ago in [facility]) who presents to [facility] today with chest pain.   Chest pain --Troponin x1 negative, ECG with some non specific ST/TW changes. Patient has a chronic RBBB -- Will admit to telemtry for observation -- Cycle Troponin and serial ECGs -- Continue ASA/plavix, lopressor  BID and atorvastatin .  -- In the setting of recent DES placement, similar sx and a period of non compliance with DAPT we will plan for [facility] tomorrow. NPO after midnight  Long QTc- will correct K and check mag  Hypokalemia- K (2.5). He has been given IV K in the ED   HTN- BP not well controlled. Will resume home meds and continue to monitor  HLD- continue statin  Signed, Janetta Hora, PA-C 03/19/2015, 3:13 PM  Pager [phone] As above, patient seen and examined. Briefly he is a 69 year old male with past medical history of coronary artery disease, hypertension, hyperlipidemia with chest pain.  Patient apparently had a drug-eluting stent to his right coronary artery in [facility] approximately 6 months ago. Details are not available. He has had occasional chest pain since that time and states he had a stress test that was unremarkable. He has continued to have occasional chest pain and had 4 episodes for approximately 15 minutes each yesterday. The pain is in the epigastric area. It does not radiate. He describes associated nausea and dyspnea. The pain is not exertional, pleuritic, positional. He is presently pain-free. Note he did not take his antiplatelets medications for several weeks. He also states his symptoms are similar to those prior to his previous intervention. Electrocardiogram shows sinus rhythm, right bundle branch block, nonspecific ST changes and prolonged QT interval. Patient's symptoms have both typical and atypical features. Will admit and cycle enzymes. Continue aspirin, Plavix, statin and add heparin. Given that his symptoms have been recurrent and previous functional study unremarkable by his report I think definitive evaluation is warranted. Plan to proceed with cardiac catheterization tomorrow morning. The risks and benefits were discussed and the patient agrees to proceed. Supplement potassium. Recheck electrocardiogram for QT interval tomorrow morning. Patient counseled on discontinuing tobacco use. Olga Millers

## 2015-03-19 NOTE — ED Notes (Signed)
Cardiology MD at bedside.

## 2015-03-19 NOTE — ED Notes (Signed)
Pt in from [facility] via [facility], per report Substernal CP radiating to L chest & L jaw intermittently onset yesterday, pt hx of stent placement 6 mths ago, pt takes plavix, pt rcvd ASA 324 mg pta, x 1 SL nitro with pain from 6/10 to 2/10, pt denies v/d, pt c/o nausea, A&O x4, follows commands, speaks in complete sentences

## 2015-03-20 ENCOUNTER — Telehealth: Payer: Self-pay

## 2015-03-20 ENCOUNTER — Encounter (HOSPITAL_COMMUNITY)
Admission: EM | Disposition: A | Discharge status: Other Acute Inpatient Hospital | Payer: Self-pay | Source: Home / Self Care | Attending: Cardiology

## 2015-03-20 ENCOUNTER — Inpatient Hospital Stay (HOSPITAL_COMMUNITY): Payer: Medicare PPO

## 2015-03-20 ENCOUNTER — Encounter (HOSPITAL_COMMUNITY): Payer: Self-pay | Admitting: General Practice

## 2015-03-20 DIAGNOSIS — I200 Unstable angina: Secondary | ICD-10-CM | POA: Diagnosis not present

## 2015-03-20 DIAGNOSIS — F331 Major depressive disorder, recurrent, moderate: Secondary | ICD-10-CM | POA: Diagnosis present

## 2015-03-20 DIAGNOSIS — E785 Hyperlipidemia, unspecified: Secondary | ICD-10-CM

## 2015-03-20 DIAGNOSIS — I2510 Atherosclerotic heart disease of native coronary artery without angina pectoris: Secondary | ICD-10-CM | POA: Diagnosis not present

## 2015-03-20 DIAGNOSIS — E876 Hypokalemia: Secondary | ICD-10-CM | POA: Diagnosis not present

## 2015-03-20 DIAGNOSIS — Z7902 Long term (current) use of antithrombotics/antiplatelets: Secondary | ICD-10-CM | POA: Diagnosis not present

## 2015-03-20 DIAGNOSIS — Z955 Presence of coronary angioplasty implant and graft: Secondary | ICD-10-CM | POA: Diagnosis not present

## 2015-03-20 DIAGNOSIS — Z590 Homelessness: Secondary | ICD-10-CM | POA: Diagnosis not present

## 2015-03-20 DIAGNOSIS — N1330 Unspecified hydronephrosis: Secondary | ICD-10-CM | POA: Diagnosis present

## 2015-03-20 DIAGNOSIS — I252 Old myocardial infarction: Secondary | ICD-10-CM | POA: Diagnosis not present

## 2015-03-20 DIAGNOSIS — Y92239 Unspecified place in hospital as the place of occurrence of the external cause: Secondary | ICD-10-CM | POA: Diagnosis not present

## 2015-03-20 DIAGNOSIS — F419 Anxiety disorder, unspecified: Secondary | ICD-10-CM | POA: Diagnosis not present

## 2015-03-20 DIAGNOSIS — R7989 Other specified abnormal findings of blood chemistry: Secondary | ICD-10-CM | POA: Diagnosis not present

## 2015-03-20 DIAGNOSIS — R45851 Suicidal ideations: Secondary | ICD-10-CM | POA: Diagnosis not present

## 2015-03-20 DIAGNOSIS — R079 Chest pain, unspecified: Secondary | ICD-10-CM | POA: Diagnosis present

## 2015-03-20 DIAGNOSIS — Y658 Other specified misadventures during surgical and medical care: Secondary | ICD-10-CM | POA: Diagnosis not present

## 2015-03-20 DIAGNOSIS — D649 Anemia, unspecified: Secondary | ICD-10-CM | POA: Diagnosis not present

## 2015-03-20 DIAGNOSIS — K219 Gastro-esophageal reflux disease without esophagitis: Secondary | ICD-10-CM | POA: Diagnosis not present

## 2015-03-20 DIAGNOSIS — N1339 Other hydronephrosis: Secondary | ICD-10-CM | POA: Diagnosis not present

## 2015-03-20 DIAGNOSIS — N401 Enlarged prostate with lower urinary tract symptoms: Secondary | ICD-10-CM | POA: Diagnosis not present

## 2015-03-20 DIAGNOSIS — Z9119 Patient's noncompliance with other medical treatment and regimen: Secondary | ICD-10-CM | POA: Diagnosis not present

## 2015-03-20 DIAGNOSIS — E669 Obesity, unspecified: Secondary | ICD-10-CM | POA: Diagnosis not present

## 2015-03-20 DIAGNOSIS — I25110 Atherosclerotic heart disease of native coronary artery with unstable angina pectoris: Secondary | ICD-10-CM | POA: Diagnosis present

## 2015-03-20 DIAGNOSIS — N400 Enlarged prostate without lower urinary tract symptoms: Secondary | ICD-10-CM | POA: Diagnosis not present

## 2015-03-20 DIAGNOSIS — R338 Other retention of urine: Secondary | ICD-10-CM | POA: Diagnosis not present

## 2015-03-20 DIAGNOSIS — R310 Gross hematuria: Secondary | ICD-10-CM | POA: Diagnosis not present

## 2015-03-20 DIAGNOSIS — Z87891 Personal history of nicotine dependence: Secondary | ICD-10-CM | POA: Diagnosis not present

## 2015-03-20 DIAGNOSIS — S3739XA Other injury of urethra, initial encounter: Secondary | ICD-10-CM | POA: Diagnosis not present

## 2015-03-20 DIAGNOSIS — Z6830 Body mass index (BMI) 30.0-30.9, adult: Secondary | ICD-10-CM | POA: Diagnosis not present

## 2015-03-20 DIAGNOSIS — I4581 Long QT syndrome: Secondary | ICD-10-CM | POA: Diagnosis not present

## 2015-03-20 DIAGNOSIS — I4510 Unspecified right bundle-branch block: Secondary | ICD-10-CM | POA: Diagnosis not present

## 2015-03-20 DIAGNOSIS — Z79899 Other long term (current) drug therapy: Secondary | ICD-10-CM | POA: Diagnosis not present

## 2015-03-20 DIAGNOSIS — R32 Unspecified urinary incontinence: Secondary | ICD-10-CM | POA: Diagnosis not present

## 2015-03-20 DIAGNOSIS — I10 Essential (primary) hypertension: Secondary | ICD-10-CM | POA: Diagnosis not present

## 2015-03-20 HISTORY — PX: CARDIAC CATHETERIZATION: SHX172

## 2015-03-20 LAB — COMPREHENSIVE METABOLIC PANEL
ALBUMIN: 2.9 g/dL — AB (ref 3.5–5.0)
ALK PHOS: 54 U/L (ref 38–126)
ALT: 7 U/L — ABNORMAL LOW (ref 17–63)
ANION GAP: 7 (ref 5–15)
AST: 12 U/L — ABNORMAL LOW (ref 15–41)
BUN: 12 mg/dL (ref 6–20)
CALCIUM: 8.3 mg/dL — AB (ref 8.9–10.3)
CHLORIDE: 105 mmol/L (ref 101–111)
CO2: 29 mmol/L (ref 22–32)
Creatinine, Ser: 1.23 mg/dL (ref 0.61–1.24)
GFR calc non Af Amer: 59 mL/min — ABNORMAL LOW (ref >60)
GLUCOSE: 110 mg/dL — AB (ref 65–99)
POTASSIUM: 2.8 mmol/L — AB (ref 3.5–5.1)
SODIUM: 141 mmol/L (ref 135–145)
Total Bilirubin: 0.5 mg/dL (ref 0.3–1.2)
Total Protein: 5.1 g/dL — ABNORMAL LOW (ref 6.5–8.1)

## 2015-03-20 LAB — CBC
HCT: 32.3 % — ABNORMAL LOW (ref 39.0–52.0)
Hemoglobin: 10.7 g/dL — ABNORMAL LOW (ref 13.0–17.0)
MCH: 29.6 pg (ref 26.0–34.0)
MCHC: 33.1 g/dL (ref 30.0–36.0)
MCV: 89.2 fL (ref 78.0–100.0)
PLATELETS: 173 10*3/uL (ref 150–400)
RBC: 3.62 MIL/uL — ABNORMAL LOW (ref 4.22–5.81)
RDW: 13.7 % (ref 11.5–15.5)
WBC: 5.6 10*3/uL (ref 4.0–10.5)

## 2015-03-20 LAB — HEPARIN LEVEL (UNFRACTIONATED)
HEPARIN UNFRACTIONATED: 0.16 [IU]/mL — AB (ref 0.30–0.70)
Heparin Unfractionated: 0.25 IU/mL — ABNORMAL LOW (ref 0.30–0.70)

## 2015-03-20 LAB — POTASSIUM
POTASSIUM: 3.1 mmol/L — AB (ref 3.5–5.1)
Potassium: 3.2 mmol/L — ABNORMAL LOW (ref 3.5–5.1)

## 2015-03-20 LAB — PROTIME-INR
INR: 1.17 (ref 0.00–1.49)
Prothrombin Time: 15 seconds (ref 11.6–15.2)

## 2015-03-20 LAB — TROPONIN I
TROPONIN I: 0.03 ng/mL (ref <0.031)
Troponin I: 0.06 ng/mL — ABNORMAL HIGH (ref <0.031)

## 2015-03-20 LAB — LIPID PANEL
CHOL/HDL RATIO: 7 ratio
CHOLESTEROL: 204 mg/dL — AB (ref 0–200)
HDL: 29 mg/dL — AB (ref >40)
LDL Cholesterol: 144 mg/dL — ABNORMAL HIGH (ref 0–99)
TRIGLYCERIDES: 155 mg/dL — AB (ref <150)
VLDL: 31 mg/dL (ref 0–40)

## 2015-03-20 LAB — POCT ACTIVATED CLOTTING TIME: Activated Clotting Time: 263 seconds

## 2015-03-20 SURGERY — LEFT HEART CATH AND CORONARY ANGIOGRAPHY
Anesthesia: LOCAL

## 2015-03-20 MED ORDER — HEPARIN (PORCINE) IN NACL 2-0.9 UNIT/ML-% IJ SOLN
INTRAMUSCULAR | Status: AC
  Filled 2015-03-20: qty 500

## 2015-03-20 MED ORDER — SODIUM CHLORIDE 0.9 % IJ SOLN
3.0000 mL | Freq: Two times a day (BID) | INTRAMUSCULAR | Status: DC
  Administered 2015-03-20 – 2015-03-24 (×8): 3 mL via INTRAVENOUS

## 2015-03-20 MED ORDER — LIDOCAINE HCL (PF) 1 % IJ SOLN
INTRAMUSCULAR | Status: AC
  Filled 2015-03-20: qty 30

## 2015-03-20 MED ORDER — MIDAZOLAM HCL 2 MG/2ML IJ SOLN
INTRAMUSCULAR | Status: AC
  Filled 2015-03-20: qty 4

## 2015-03-20 MED ORDER — SODIUM CHLORIDE 0.9 % IV SOLN
250.0000 mL | INTRAVENOUS | Status: DC | PRN
  Administered 2015-03-21: 250 mL via INTRAVENOUS

## 2015-03-20 MED ORDER — POTASSIUM CHLORIDE CRYS ER 20 MEQ PO TBCR
40.0000 meq | EXTENDED_RELEASE_TABLET | ORAL | Status: AC
Start: 2015-03-20 — End: 2015-03-20
  Administered 2015-03-20 (×2): 40 meq via ORAL
  Filled 2015-03-20 (×2): qty 2

## 2015-03-20 MED ORDER — INFLUENZA VAC SPLIT QUAD 0.5 ML IM SUSY
0.5000 mL | PREFILLED_SYRINGE | INTRAMUSCULAR | Status: AC
  Administered 2015-03-21: 0.5 mL via INTRAMUSCULAR
  Filled 2015-03-20: qty 0.5

## 2015-03-20 MED ORDER — SODIUM CHLORIDE 0.9 % IJ SOLN
3.0000 mL | INTRAMUSCULAR | Status: DC | PRN
  Administered 2015-03-21 – 2015-03-22 (×2): 3 mL via INTRAVENOUS
  Filled 2015-03-20 (×2): qty 3

## 2015-03-20 MED ORDER — NITROGLYCERIN 1 MG/10 ML FOR IR/CATH LAB
INTRA_ARTERIAL | Status: DC | PRN
  Administered 2015-03-20: 15:00:00

## 2015-03-20 MED ORDER — ACETAMINOPHEN 325 MG PO TABS
650.0000 mg | ORAL_TABLET | ORAL | Status: DC | PRN
  Administered 2015-03-21 – 2015-03-25 (×9): 650 mg via ORAL
  Filled 2015-03-20 (×9): qty 2

## 2015-03-20 MED ORDER — POTASSIUM CHLORIDE CRYS ER 20 MEQ PO TBCR: 40.0000 meq | EXTENDED_RELEASE_TABLET | Freq: Once | ORAL | Status: DC

## 2015-03-20 MED ORDER — HEPARIN (PORCINE) IN NACL 2-0.9 UNIT/ML-% IJ SOLN
INTRAMUSCULAR | Status: AC
  Filled 2015-03-20: qty 1000

## 2015-03-20 MED ORDER — VERAPAMIL HCL 2.5 MG/ML IV SOLN
INTRAVENOUS | Status: AC
  Filled 2015-03-20: qty 2

## 2015-03-20 MED ORDER — NITROGLYCERIN 1 MG/10 ML FOR IR/CATH LAB
INTRA_ARTERIAL | Status: DC | PRN
  Administered 2015-03-20: 200 ug via INTRACORONARY

## 2015-03-20 MED ORDER — ADENOSINE 12 MG/4ML IV SOLN
16.0000 mL | Freq: Once | INTRAVENOUS | Status: DC
  Filled 2015-03-20: qty 16

## 2015-03-20 MED ORDER — ADENOSINE (DIAGNOSTIC) 140MCG/KG/MIN
INTRAVENOUS | Status: DC | PRN
  Administered 2015-03-20: 140 ug/kg/min via INTRAVENOUS

## 2015-03-20 MED ORDER — ATORVASTATIN CALCIUM 80 MG PO TABS
80.0000 mg | ORAL_TABLET | Freq: Every day | ORAL | Status: DC
  Administered 2015-03-20 – 2015-03-26 (×7): 80 mg via ORAL
  Filled 2015-03-20 (×7): qty 1

## 2015-03-20 MED ORDER — IOHEXOL 350 MG/ML SOLN
INTRAVENOUS | Status: DC | PRN
  Administered 2015-03-20: 150 mL via INTRAVENOUS

## 2015-03-20 MED ORDER — MIDAZOLAM HCL 2 MG/2ML IJ SOLN
INTRAMUSCULAR | Status: DC | PRN
  Administered 2015-03-20 (×2): 1 mg via INTRAVENOUS

## 2015-03-20 MED ORDER — OXYCODONE-ACETAMINOPHEN 5-325 MG PO TABS
1.0000 | ORAL_TABLET | ORAL | Status: DC | PRN
  Administered 2015-03-20: 1 via ORAL
  Administered 2015-03-20: 2 via ORAL
  Administered 2015-03-21: 1 via ORAL
  Administered 2015-03-21 (×2): 2 via ORAL
  Administered 2015-03-21: 1 via ORAL
  Administered 2015-03-22: 2 via ORAL
  Filled 2015-03-20 (×2): qty 2
  Filled 2015-03-20: qty 1
  Filled 2015-03-20 (×2): qty 2
  Filled 2015-03-20 (×2): qty 1

## 2015-03-20 MED ORDER — HEPARIN SODIUM (PORCINE) 1000 UNIT/ML IJ SOLN
INTRAMUSCULAR | Status: DC | PRN
  Administered 2015-03-20: 5000 [IU] via INTRAVENOUS
  Administered 2015-03-20: 2500 [IU] via INTRAVENOUS

## 2015-03-20 MED ORDER — HEPARIN BOLUS VIA INFUSION
2500.0000 [IU] | Freq: Once | INTRAVENOUS | Status: AC
  Administered 2015-03-20: 2500 [IU] via INTRAVENOUS
  Filled 2015-03-20: qty 2500

## 2015-03-20 MED ORDER — FENTANYL CITRATE (PF) 100 MCG/2ML IJ SOLN
INTRAMUSCULAR | Status: DC | PRN
  Administered 2015-03-20: 50 ug via INTRAVENOUS

## 2015-03-20 MED ORDER — VERAPAMIL HCL 2.5 MG/ML IV SOLN
INTRAVENOUS | Status: DC | PRN
  Administered 2015-03-20: 15:00:00 via INTRA_ARTERIAL

## 2015-03-20 MED ORDER — HEPARIN SODIUM (PORCINE) 1000 UNIT/ML IJ SOLN
INTRAMUSCULAR | Status: AC
  Filled 2015-03-20: qty 1

## 2015-03-20 MED ORDER — FENTANYL CITRATE (PF) 100 MCG/2ML IJ SOLN
INTRAMUSCULAR | Status: AC
  Filled 2015-03-20: qty 4

## 2015-03-20 MED ORDER — NITROGLYCERIN 1 MG/10 ML FOR IR/CATH LAB
INTRA_ARTERIAL | Status: AC
  Filled 2015-03-20: qty 10

## 2015-03-20 MED ORDER — SODIUM CHLORIDE 0.9 % WEIGHT BASED INFUSION: 1.0000 mL/kg/h | INTRAVENOUS | Status: AC

## 2015-03-20 SURGICAL SUPPLY — 11 items
CATH INFINITI 5 FR JL3.5 (CATHETERS) ×2 IMPLANT
CATH INFINITI JR4 5F (CATHETERS) ×2 IMPLANT
CATH VISTA GUIDE 6FR XBLAD3.5 (CATHETERS) ×1 IMPLANT
DEVICE RAD COMP TR BAND LRG (VASCULAR PRODUCTS) ×2 IMPLANT
GLIDESHEATH SLEND A-KIT 6F 22G (SHEATH) ×2 IMPLANT
GUIDEWIRE PRESSURE COMET II (WIRE) ×1 IMPLANT
KIT HEART LEFT (KITS) ×2 IMPLANT
PACK CARDIAC CATHETERIZATION (CUSTOM PROCEDURE TRAY) ×2 IMPLANT
TRANSDUCER W/STOPCOCK (MISCELLANEOUS) ×2 IMPLANT
TUBING CIL FLEX 10 FLL-RA (TUBING) ×2 IMPLANT
WIRE SAFE-T 1.5MM-J .035X260CM (WIRE) ×2 IMPLANT

## 2015-03-20 NOTE — Progress Notes (Signed)
Sent message to Cards Fellow of [employer] x2. Pt obn Heparin gtt, scheduled for cath in am. Pt not complaining of CP, sleeping

## 2015-03-20 NOTE — Telephone Encounter (Signed)
A hospital follow up appointment was scheduled for 03/25/15 @ 1530 with Dr Venetia Night. The information was placed on the AVS.   Voice mail message left for B. Elbert Ewings, CM notifying her of the scheduled appt.

## 2015-03-20 NOTE — Progress Notes (Signed)
ANTICOAGULATION CONSULT NOTE - Follow-up Consult  Pharmacy Consult for heparin Indication: ACS / STEMI  Allergies  Allergen Reactions  . Aspirin     Patient states can take low dose aspirin     Patient Measurements: Height:  (167.6 cm) Weight: 210 lb (95.255 kg) IBW/kg (Calculated) : 63.8 Heparin Dosing Weight: 84.5 kg   Vital Signs: Temp: 98.3 F (36.8 C) (09/21 0500) Temp Source: Oral (09/21 0500) BP: 160/86 mmHg (09/21 0800) Pulse Rate: 72 (09/21 0500)  Labs:  Recent Labs  03/19/15 1343 03/19/15 2031 03/19/15 2340 03/20/15 0104 03/20/15 0443 03/20/15 0710  HGB 12.3*  --   --   --  10.7*  --   HCT 36.3*  --   --   --  32.3*  --   PLT 208  --   --   --  173  --   LABPROT  --  15.0  --   --  15.0  --   INR  --  1.16  --   --  1.17  --   HEPARINUNFRC  --   --  0.16*  --   --  0.25*  CREATININE 1.21  --   --   --  1.23  --   TROPONINI  --  0.05*  --  0.06*  --  0.03    Estimated Creatinine Clearance: 62.1 mL/min (by C-G formula based on Cr of 1.23).  Assessment: 69 yo M on heparin for CP. Plan for cath today - scheduled for 1130. No issues with line or bleeding reported per RN.  HL remains slightly subtherapeutic (0.25) after increase to 1400 units/h + bolus. No IV line/bleed issues per RN  Goal of Therapy:  Heparin level 0.3-0.7 units/ml Monitor platelets by anticoagulation protocol: Yes   Plan:  Increase heparin gtt to 1550 units/hr Check 6 hr HL Monitor daily HL, CBC, s/s of bleed Cath today  Babs Bertin, PharmD Clinical Pharmacist Pager [phone] 03/20/2015 9:00 AM

## 2015-03-20 NOTE — Progress Notes (Signed)
Patient Name: [name] [name] Date of Encounter: 03/20/2015  Primary Cardiologist: Seen by Dr. Gala Romney in 2006   Active Problems:   Dyslipidemia   Essential hypertension   Unstable angina   Chest pain   Abnormal nuclear cardiac imaging test    SUBJECTIVE  Denies any CP last night. Last episode yesterday. No SOB  CURRENT MEDS . [START ON 03/21/2015] aspirin EC  81 mg Oral Daily  . atorvastatin  40 mg Oral q1800  . clopidogrel  75 mg Oral Daily  . finasteride  5 mg Oral Daily  . metoprolol  50 mg Oral BID  . pantoprazole  40 mg Oral Q1200  . potassium chloride  40 mEq Oral Q2H  . sodium chloride  3 mL Intravenous Q12H  . sodium chloride  3 mL Intravenous Q12H  . tamsulosin  0.4 mg Oral QPC supper    OBJECTIVE  Filed Vitals:   03/19/15 1830 03/19/15 1831 03/19/15 2100 03/20/15 0500  BP: 181/80 181/80 144/68 152/84  Pulse: 89 84 73 72  Temp:   98.3 F (36.8 C) 98.3 F (36.8 C)  TempSrc:   Oral Oral  Resp: Height:      Weight:      SpO2: 95%  97% 96%   No intake or output data in the 24 hours ending 03/20/15 0654 Filed Weights   03/19/15 1328  Weight: 210 lb (95.255 kg)    PHYSICAL EXAM  General: Pleasant, NAD. Neuro: Alert and oriented X 3. Moves all extremities spontaneously. Psych: Normal affect. HEENT:  Normal  Neck: Supple without bruits or JVD. Lungs:  Resp regular and unlabored, CTA. Heart: RRR no s3, s4, or murmurs. Abdomen: Soft, non-tender, non-distended, BS + x 4.  Extremities: No clubbing, cyanosis or edema. DP/PT/Radials 2+ and equal bilaterally.  Accessory Clinical Findings  CBC  Recent Labs  03/19/15 1343 03/20/15 0443  WBC 7.1 5.6  HGB 12.3* 10.7*  HCT 36.3* 32.3*  MCV 89.0 89.2  PLT 208 173   Basic Metabolic Panel  Recent Labs  03/19/15 1343 03/19/15 1625 03/20/15 0443  NA 142  --  141  K 2.5*  --  2.8*  CL 102  --  105  CO2 30  --  29  GLUCOSE 112*  --  110*  BUN 7  --  12  CREATININE 1.21  --  1.23    CALCIUM 9.3  --  8.3*  MG  --  1.7  --    Liver Function Tests  Recent Labs  03/20/15 0443  AST 12*  ALT 7*  ALKPHOS 54  BILITOT 0.5  PROT 5.1*  ALBUMIN 2.9*   Cardiac Enzymes  Recent Labs  03/19/15 2031 03/20/15 0104  TROPONINI 0.05* 0.06*   Fasting Lipid Panel  Recent Labs  03/20/15 0443  CHOL 204*  HDL 29*  LDLCALC 144*  TRIG 155*  CHOLHDL 7.0   TELE NSR without significant ventricular ectopy    ECG  RBBB with diffse TWI and some anterior ST depression  Radiology/Studies  Dg Chest 2 View  03/19/2015   CLINICAL DATA:  Chest pain for 2 days.  EXAM: CHEST  2 VIEW  COMPARISON:  PA and lateral chest 05/14/2013.  FINDINGS: The lungs are clear. Heart size is normal. No pneumothorax or pleural effusion. No focal bony abnormality.  IMPRESSION: No acute disease.   Electronically Signed   By: Drusilla Kanner M.D.   On: 03/19/2015 14:17    ASSESSMENT AND PLAN  [name] [name] is a 69 y.o. male with a history of hypertension, hyperlipidemia, obesity, depression and CAD s/p recent DES to mRCA (6months ago in [facility]) who presents to [facility] today with chest pain.   1. Chest pain concerning for angina reminescent of prior angina  - trop 0.05 --> 0.06. EKG RBB with diffuse TWI  - planning for diagnostic catheterization today at 11:30AM with Dr. Katrinka Blazing. Although ASA is listed as allergy, he has no problem taking low dose ASA  - Risk and benefit of procedure explained to the patient who display clear understanding and agree to proceed. Discussed with patient possible procedural risk include bleeding, vascular injury, renal injury, arrythmia, MI, stroke and loss of limb or life.   2. CAD  - seen by Dr. Gala Romney in 2006, s/p cath 05/07/2005 revealing a normal left main, a 50-60%/40-50% stenosis in the mid-LAD, diffuse disease in the left circumflex and OM-2, and a 40% lesion in the distal RCA. The LAD was wired using a Doppler flow wire revealing a FFR of 0.93 indicating no  flow limiting stenosis. Medical management  - s/p cath 6 month ago and had DES to mid RCA by Dr. Claudette Laws in [facility]. Had a stress test since then which reportedly is ok. Off DAPT for several weeks due to financial reason.  3. Hypokalemia: K 2.8 this morning. Given x2 dose this morning. Will given another 2 dose today.   4. HTN  5. HLD: Chol 204, Trig 155, HDL 29, LDL 144  6. Tobacco abuse  Signed, Azalee Course PA-C Pager: [phone]  I have seen and examined the patient along with Amedeo Plenty, PA NP.  I have reviewed the chart, notes and new data.  I agree with PA/NP's note.  Key new complaints: atypical chest discomfort; has overflow incontinence symptoms Key examination changes: seems to have distended bladder, no HF Key new findings / data: severe hypokalemia (tubular disorder due to hydronephrosis?). QTC is prolonged not only due to K level, but also RBBB  PLAN: Post void residual check and renal US. Repeat K - would like level> 3.0-3.3 before cardiac cath. May need Urology consult after cardiac cath. SW consult.  Thurmon Fair, MD, [facility] [phone] 03/20/2015, 9:37 AM

## 2015-03-20 NOTE — Progress Notes (Signed)
ANTICOAGULATION CONSULT NOTE - Follow-up Consult  Pharmacy Consult for heparin Indication: ACS / STEMI  Allergies  Allergen Reactions  . Aspirin     Patient states can take low dose aspirin     Patient Measurements: Height:  (167.6 cm) Weight: 210 lb (95.255 kg) IBW/kg (Calculated) : 63.8 Heparin Dosing Weight: 84.5 kg   Vital Signs: Temp: 98.3 F (36.8 C) (09/20 2100) Temp Source: Oral (09/20 2100) BP: 144/68 mmHg (09/20 2100) Pulse Rate: 73 (09/20 2100)  Labs:  Recent Labs  03/19/15 1343 03/19/15 2031 03/19/15 2340  HGB 12.3*  --   --   HCT 36.3*  --   --   PLT 208  --   --   LABPROT  --  15.0  --   INR  --  1.16  --   HEPARINUNFRC  --   --  0.16*  CREATININE 1.21  --   --   TROPONINI  --  0.05*  --     Estimated Creatinine Clearance: 63.1 mL/min (by C-G formula based on Cr of 1.21).  Assessment: 69 yo M on heparin for CP. Heparin level 0.16 (subtherapeutic) on 1100 units/hr. Plan for cath today - scheduled for 1130. No issues with line or bleeding reported per RN.  Goal of Therapy:  Heparin level 0.3-0.7 units/ml Monitor platelets by anticoagulation protocol: Yes   Plan:  Rebolus heparin 2500 units IV Increase heparin gtt to 1400 units/hr Check 6 hr HL  Christoper Fabian, PharmD, BCPS Clinical pharmacist, pager [phone] 03/20/2015,1:47 AM

## 2015-03-20 NOTE — Hospital Discharge Follow-Up (Signed)
Message received from Tomi Bamberger, CM requesting a hospital follow up appointment for the patient.  He has recently re-located to [place].  He was out of the room for a cardiac cath when this CM went to see him.   CM to follow up with the patient tomorrow to discuss [facility] services.  Update provided to [employer], CM.

## 2015-03-20 NOTE — Care Management Note (Addendum)
Case Management Note  Patient Details  Name: Rohil Lesch MRN: [id number] Date of Birth: 08-Feb-1946  Subjective/Objective: Pt admitted for chest pain. Plan for cardiac cath 03-20-15. [place]. [place] been in [place] for 2 weeks. Per pt he has a son here, however they are not on very good terms. Pt states [place] still at airport. CSW consulted for transportation needs and housing resources.                    Action/Plan: CM unable to assist with medications due to insurance on file, however CM did call the [facility] Liaison to see if hospital f/u for PCP could be established. Awaiting call back. CM will continue to monitor for additional needs as well.   Expected Discharge Date:                  Expected Discharge Plan:  Home/Self Care  In-House Referral:  Clinical Social Work  Discharge planning Services  CM Consult, Medication Assistance, Indigent Health Clinic  Post Acute Care Choice:  NA Choice offered to:  NA  DME Arranged:    DME Agency:     HH Arranged:    HH Agency:     Status of Service:  In process, will continue to follow  Medicare Important Message Given:    Date Medicare IM Given:    Medicare IM give by:    Date Additional Medicare IM Given:    Additional Medicare Important Message give by:     If discussed at Long Length of Stay Meetings, dates discussed:    Additional Comments:   1015 03-27-15 Tomi Bamberger, RN,BSN [id number] Plan is for d/c to [facility] today. CSW assisting with disposition needs. No further needs from CM at this time.   1553 03-25-15 Tomi Bamberger, RN,BSN [id number]  CM did speak with Physician Advisor about 1:45 in ref to case. Psych MD stated pt would not be able to be d/c if he did not have a safe environment to be d/c to. CM did make physician advisor aware. Advisor did call CM back in ref to ALF. CSW Lupita Leash and this CM did speak with pt in regards  to being able to sign check over to an ALF- option that Physician advisor provided. Pt was willing to sign check over, However notes reflect from Psych MD that pt needs to be placed in inpatient facility. Advisor was notified and he was to make some phone calls to [facility]. Hopefully bed will be available and pt can be acute to acute transfer. Hospital f/u was rescheduled for the Clinic. Can be found on the AVS. No further needs from CM at this time.    1054 03-25-15 Tomi Bamberger, RN,BSN [phone] CM / CSW did speak with pt in regards to disposition needs. Pt has hospital f/u established for 03-25-15. CM did make pt aware that CSW is on site for additional resources as well as pharmacy on site. CM did call the [facility] Liaison to see if pt would still be able to get medications- no funds available. Clinic will still charge Medicare, however will place co pay on an account and will not have to pay today. Will need to pay once check is deposited in account. Per pt he will get check on Wednesday. CSW and CM were speaking with pt and he states he is having suicidal ideations. CM did call [facility]  for stat referral for Dr. Shela Commons with Psych to see pt. Sitter at bedside.  CSW trying to see if pastoral care could possibly provide money for hotel stay,If not pt has shelter resources. Pt will be d/c with a foley catheter. CSW to provide cab voucher to pick up car at the [facility]. No further needs from CM at this time.   1028 03-21-15 Tomi Bamberger, RN,BSN [phone] CM did speak with [facility] Liaison in reference to Hospital f/u. Appointment Scheduled for 03-25-15 and placed on AVS. Pt has [employer] Medicare listed for insurance- CM will not be able to assist with Medications. Pharmacy is on site at the [facility]. No further needs from CM at this time.  Gala Lewandowsky, RN 03/20/2015, 1:40 PM

## 2015-03-21 ENCOUNTER — Encounter (HOSPITAL_COMMUNITY): Payer: Self-pay | Admitting: Interventional Cardiology

## 2015-03-21 DIAGNOSIS — I25110 Atherosclerotic heart disease of native coronary artery with unstable angina pectoris: Principal | ICD-10-CM

## 2015-03-21 DIAGNOSIS — I10 Essential (primary) hypertension: Secondary | ICD-10-CM

## 2015-03-21 LAB — NA AND K (SODIUM & POTASSIUM), RAND UR
POTASSIUM UR: 11 mmol/L
Sodium, Ur: 115 mmol/L

## 2015-03-21 LAB — CBC
HEMATOCRIT: 31.8 % — AB (ref 39.0–52.0)
HEMOGLOBIN: 10.8 g/dL — AB (ref 13.0–17.0)
MCH: 30.3 pg (ref 26.0–34.0)
MCHC: 34 g/dL (ref 30.0–36.0)
MCV: 89.1 fL (ref 78.0–100.0)
Platelets: 170 10*3/uL (ref 150–400)
RBC: 3.57 MIL/uL — ABNORMAL LOW (ref 4.22–5.81)
RDW: 13.9 % (ref 11.5–15.5)
WBC: 7.1 10*3/uL (ref 4.0–10.5)

## 2015-03-21 LAB — HEMOGLOBIN A1C
Hgb A1c MFr Bld: 5.5 % (ref 4.8–5.6)
MEAN PLASMA GLUCOSE: 111 mg/dL

## 2015-03-21 LAB — OSMOLALITY: Osmolality: 287 mOsm/kg (ref 275–300)

## 2015-03-21 LAB — POTASSIUM: POTASSIUM: 3.1 mmol/L — AB (ref 3.5–5.1)

## 2015-03-21 LAB — OSMOLALITY, URINE: Osmolality, Ur: 294 mOsm/kg — ABNORMAL LOW (ref 390–1090)

## 2015-03-21 MED ORDER — ISOSORBIDE MONONITRATE ER 30 MG PO TB24
30.0000 mg | ORAL_TABLET | Freq: Every day | ORAL | Status: DC
  Administered 2015-03-21 – 2015-03-27 (×7): 30 mg via ORAL
  Filled 2015-03-21 (×7): qty 1

## 2015-03-21 NOTE — Clinical Social Work Note (Signed)
Clinical Social Work Assessment  Patient Details  Name: [name] [name] MRN: [id number] Date of Birth: [birth year]  Date of referral:  03/21/15               Reason for consult:  Housing Concerns/Homelessness, Transportation                Permission sought to share information with:  Other Permission granted to share information::  Yes, Verbal Permission Granted  Name::        Agency::  [employer]  Relationship::     Contact Information:  [phone]  Housing/Transportation Living arrangements for the past 2 months:  Hotel/Motel Source of Information:  Patient Patient Interpreter Needed:  None Criminal Activity/Legal Involvement Pertinent to Current Situation/Hospitalization:  No - Comment as needed Significant Relationships:  None (Pt estranged from his adult son) Lives with:  Self Do you feel safe going back to the place where you live?  Yes Need for family participation in patient care:  No (Coment)  Care giving concerns:  Pt admitted from motel/hotel and unsure how he will return   Office manager / plan:  CSW visited pt room and he confirmed he was staying at the [employer] by the airport prior to admission. Pt moved back to [place] [place] and was planning to stay with his son. However, pt and pt son with poor relationship (per patient) and pt son not willing to forgive at this time. Pt informed CSW he is able to continue to pay for hotel stay but is unsure if they are willing to allow pt to return. Pt agreeable to CSW calling hotel and confirming he can return. CSW did speak with pt about long term housing plan. Pt stated he has not thought about this since he was planning on staying with his son. CSW spoke with pt about preferred rent amount and provided pt with list of apartment options. Pt does have a car but it is at the hotel and he is concerned about transportation if he is able to return to the hotel. CSW discussed options with pt and pt unable to pay  for transport and most appropriate for cab at this time. CSW will provided pt with voucher at dc unless situation changes.  CSW spoke with [employer] front desk clerk who confirmed there are no issues with pt returning as long as there are rooms available. Per clerk, there will be rooms available after 2pm today. CSW will provide pt with phone number to call on day of dc and confirm room availability. At this time, pt with no further discharge needs aside from transport at dc. CSW will sign off.   Employment status:  Retired Database administrator PT Recommendations:  Not assessed at this time Information / Referral to community resources:  Other (Comment Required) (Affordable housing)  Patient/Family's Response to care:  Pt accepting of CSW assistance and wanting to return to same hotel if possible.   Patient/Family's Understanding of and Emotional Response to Diagnosis, Current Treatment, and Prognosis:  Pt with good insight on condition. Pt emotional about relationship with his son and current hospitalization but limited response about emotions regarding treatment/diagnosis.   Emotional Assessment Appearance:  Appears stated age, Well-Groomed Attitude/Demeanor/Rapport:  Other (Cooperative) Affect (typically observed):  Accepting, Pleasant Orientation:  Oriented to Self, Oriented to Place, Oriented to  Time, Oriented to Situation Alcohol / Substance use:  Not Applicable Psych involvement (Current and /or in the community):  No (Comment)  Discharge Needs  Concerns to be addressed:  Discharge Planning Concerns (Transportation and housing) Readmission within the last 30 days:  No Current discharge risk:  None Barriers to Discharge:  Continued Medical Work up   [employer], LCSW [phone]

## 2015-03-21 NOTE — Consult Note (Signed)
Urology Consult  Consulting MD: Dr. Jens Som  CC: Urinary retention   HPI: This is a 69 year old male with a prior history of BPH. He was admitted to the cardiology service for chest pain. He was noted to have a large residual urine volume. Prior to hospitalization, he had stopped taking his BPH medicines-Flomax and Proscar, secondary to not having seen a urologist/physician recently. A catheter was placed upon admission, 800 mL of urine was obtained. Urology was consulted.  The patient has had worsened urinary symptomatology over the past month or 2 since he stopped taking his medications. Since the catheter was placed, he has had hematuria. The patient has been on a heparin drip. A small clot was irrigated by the nurse, and the patient has adequate drainage of the catheter at this time.   PMH: Past Medical History  Diagnosis Date  . Edema   . Dermatophytosis of groin and perianal area   . CAD (coronary artery disease)     cath '06, repeat '08 non-obstructive disease  . GERD (gastroesophageal reflux disease)   . Hyperlipidemia   . Allergy   . History of prostatitis   . History of BPH   . Hypertension   . Anxiety   . Depression   . Myocardial infarction ~ 08/2014    PSH: Past Surgical History  Procedure Laterality Date  . Tonsillectomy and adenoidectomy    . Coronary angioplasty with stent placement  ~ 08/2014    "1 stent"  . Cardiac catheterization  05/07/2005   . Cardiac catheterization N/A 03/20/2015    Procedure: Left Heart Cath and Coronary Angiography;  Surgeon: Lyn Records, MD;  Location: [facility] INVASIVE CV LAB;  Service: Cardiovascular;  Laterality: N/A;    Allergies: Allergies  Allergen Reactions  . Aspirin     Patient states can take low dose aspirin     Medications: Prescriptions prior to admission  Medication Sig Dispense Refill Last Dose  . atorvastatin (LIPITOR) 40 MG tablet Take 40 mg by mouth daily.   03/18/2015 at Unknown time  . clopidogrel (PLAVIX) 75  MG tablet Take 1 tablet (75 mg total) by mouth daily. 10 tablet 0 03/18/2015 at 7p  . finasteride (PROSCAR) 5 MG tablet Take 1 tablet (5 mg total) by mouth daily. 30 tablet 0 03/18/2015 at Unknown time  . metoprolol (LOPRESSOR) 50 MG tablet Take 50 mg by mouth 2 (two) times daily.   03/18/2015 at Unknown time  . omeprazole (PRILOSEC) 40 MG capsule TAKE 1 CAPSULE (40 MG TOTAL) BY MOUTH DAILY. 90 capsule 2 03/18/2015 at Unknown time  . tamsulosin (FLOMAX) 0.4 MG CAPS capsule Take 1 capsule (0.4 mg total) by mouth daily. 30 capsule 0 03/18/2015 at Unknown time  . ALPRAZolam (ALPRAZOLAM XR) 2 MG 24 hr tablet Take 1 tablet (2 mg total) by mouth every morning. 30 tablet 0   . ALPRAZolam (XANAX) 0.5 MG tablet Take 1 tablet (0.5 mg total) by mouth at bedtime as needed for sleep. 30 tablet 0   . atenolol (TENORMIN) 100 MG tablet Take 1 tablet (100 mg total) by mouth daily. 10 tablet 0 05/21/2013 at 1700  . clopidogrel (PLAVIX) 75 MG tablet TAKE 1 TABLET (75 MG TOTAL) BY MOUTH DAILY. 90 tablet 0   . DULoxetine (CYMBALTA) 60 MG capsule TAKE 1 CAPSULE (60 MG TOTAL) BY MOUTH DAILY. 30 capsule 0   . LEVITRA 20 MG tablet TAKE 1 TABLET (20 MG TOTAL) BY MOUTH DAILY AS NEEDED. 10 tablet 0 unknown  .  NITROSTAT 0.4 MG SL tablet PLACE 1 TABLET (0.4 MG TOTAL) UNDER THE TONGUE EVERY 5 (FIVE) MINUTES AS NEEDED. 25 tablet 0 unknown     Social History: Social History   Social History  . Marital Status: Divorced    Spouse Name: N/A  . Number of Children: N/A  . Years of Education: N/A   Occupational History  . Not on file.   Social History Main Topics  . Smoking status: Former Smoker    Types: Cigars  . Smokeless tobacco: Never Used     Comment: quit smoking in 2010  . Alcohol Use: Yes     Comment: ocassionally  . Drug Use: No  . Sexual Activity:    Partners: Female   Other Topics Concern  . 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 Stroke Mother   . Coronary artery disease Father   . Heart attack Father   . Cancer Neg Hx     colon or prostate cancer    Review of Systems: Pos: Frequency, urgency, nocturia, intermittency, feeling of incomplete emptying NegativeHe denies recent gross hematuria until catheter was placed.  A further 10 point review of systems was negative except what is listed in the HPI.  Physical Exam: @ General: No acute distress.  Awake. Head:  Normocephalic.  Atraumatic. ENT:  EOMI.  Mucous membranes moist Neck:  Supple.  No lymphadenopathy. CV:  . Regular rate. Pulmonary: Equal effort bilaterally.  . Abdomen: Soft.   non- tender to palpation. obese  Skin:  Normal turgor.  No visible rash. Extremity: No gross deformity of bilateral upper extremities.  No gross deformity of bilateral lower extremities. Neurologic: Alert. Appropriate mood.  Penis:  Uncircumcised.  No lesions. Urethra:  Foley catheter in place.  Orthotopic meatus. Scrotum: No lesions.  No ecchymosis.  No erythema. Testicles: Descended bilaterally.  No masses bilaterally. Epididymis: Palpable bilaterally.  Non Tender to palpation.  Studies:  Recent Labs     03/20/15  0443  03/21/15  0522  HGB  10.7*  10.8*  WBC  5.6  7.1  PLT  173  170    Recent Labs     03/19/15  1343  03/20/15  0443  03/20/15  0956  03/20/15  1738  NA  142  141   --    --   K  2.5*  2.8*  3.1*  3.2*  CL  102  105   --    --   CO2  30  29   --    --   BUN  7  12   --    --   CREATININE  1.21  1.23   --    --   CALCIUM  9.3  8.3*   --    --   GFRNONAA  60*  59*   --    --   GFRAA  >60  >60   --    --      Recent Labs     03/19/15  2031  03/20/15  0443  INR  1.16  1.17     Invalid input(s): ABG   urine is slightly bloody/pink but draining appropriately   Assessment:   1.  BPH. The patient has seen urologists before-first, Dr.  Ottelin here in [place] prior to his moving to [place] temporarily. He has been managed adequately up to this point on dual medical therapy-tamsulosin and finasteride. He has not been on these recently. He has subsequent urinary retention. Assist a properly managed currently with Foley catheter drainage.  2. Urinary retention-see above  3. Gross hematuria-not a regular occurrence in this man, but he did have probable mild trauma with catheter placement concurrently with heparinization. This should clear  Plan:   1. Leave Foley catheter in for now-hopefully, hematuria Jecenia Leamer resolve spontaneously.  2. I Kataleena Holsapple start him on finasteride and tamsulosin  3. I would recommend letting him go home with the catheter in place. He has instructions to follow-up with [place] in a week or so for catheter removal/voiding trial. Have him sent home with a leg bag with instructions/training how to drain this  4. I provided him with a prescription for both tamsulosin and and finasteride  5. Reconsult [place] if further catheter management necessary.    Pager:[phone]

## 2015-03-21 NOTE — Progress Notes (Addendum)
CARDIAC REHAB PHASE I   Pt declines ambulation states he does not feel well and has felt nauseated from pain medication this morning. Completed MI education.  Reviewed risk factors, MI book, medical management, anti-platelet therapy, activity restrictions, ntg, exercise, heart healthy diet, portion control, and phase 2 cardiac rehab. Pt verbalized understanding. Pt agrees to phase 2 cardiac rehab referral, will send to [facility].  Pt receptive to education. Pt has a complicated psychosocial situation, reports feeling depressed and reports having suicidal thoughts enter his head in the past but denies current SI. Pt states he has not been taking his cymbalta and thinks he needs it again. Pt states he would like to speak with a psychologist and needs to be reestablished with a new psychologist as an outpatient. Pt also c/o intermittent jaw pain, 4/10, spontaneously resolved, RN notified.  Pt agreeable to ambulate, however, it was discovered upon preparing to get out of bed that pt foley was leaking, bed sheets saturated. RN notified to assess. Will follow-up and attempt to ambulate with pt this afternoon as schedule permits. If unable to return today, and if pt to discharge today, encourage pt to ambulate with RN.   [phone]    Joylene Grapes, RN, BSN 03/21/2015 12:18 PM

## 2015-03-21 NOTE — Care Management (Signed)
[facility] [place]:  This Case Manager spoke with Jacqlyn Krauss, RN CM regarding patient's needs.  Patient does not have a current PCP and hospital follow-up appointment scheduled on 03/25/15 at 1530 with Dr. Jarold Song. Appointment on AVS.  Met with patient at bedside to provide information about the [facility] and informed patient of onsite pharmacy. Patient agreeable to appointment on 03/25/15 at 1530 and indicates he ultimately wants to reestablish care at [facility]. No other needs identified at this time.

## 2015-03-21 NOTE — Progress Notes (Signed)
Patient Name: [name] [name] Date of Encounter: 03/21/2015  Primary Cardiologist: Seen by Dr. Gala Romney in 2006   Active Problems:   Dyslipidemia   Essential hypertension   Unstable angina   Chest pain   Abnormal nuclear cardiac imaging test    SUBJECTIVE  Denies any CP or SOB.   CURRENT MEDS . aspirin EC  81 mg Oral Daily  . atorvastatin  80 mg Oral q1800  . clopidogrel  75 mg Oral Daily  . finasteride  5 mg Oral Daily  . Influenza vac split quadrivalent PF  0.5 mL Intramuscular Tomorrow-1000  . metoprolol  50 mg Oral BID  . pantoprazole  40 mg Oral Q1200  . sodium chloride  3 mL Intravenous Q12H  . tamsulosin  0.4 mg Oral QPC supper    OBJECTIVE  Filed Vitals:   03/20/15 1800 03/20/15 1900 03/20/15 2137 03/21/15 0500  BP: 134/114 159/85 142/60 183/80  Pulse:   75 75  Temp:   98.2 F (36.8 C) 98.1 F (36.7 C)  TempSrc:   Oral Oral  Resp:   17 20  Height:      Weight:    196 lb 13.9 oz (89.3 kg)  SpO2:   97% 98%    Intake/Output Summary (Last 24 hours) at 03/21/15 0853 Last data filed at 03/21/15 0500  Gross per 24 hour  Intake      0 ml  Output   3800 ml  Net  -3800 ml   Filed Weights   03/19/15 1328 03/21/15 0500  Weight: 210 lb (95.255 kg) 196 lb 13.9 oz (89.3 kg)    PHYSICAL EXAM  General: Pleasant, NAD. Neuro: Alert and oriented X 3. Moves all extremities spontaneously. Psych: Normal affect. HEENT:  Normal  Neck: Supple without bruits or JVD. Lungs:  Resp regular and unlabored, CTA. Heart: RRR no s3, s4, or murmurs. Abdomen: Soft, non-tender, non-distended, BS + x 4. Foley in place, clotted off, no urine in the bag Extremities: No clubbing, cyanosis or edema. DP/PT/Radials 2+ and equal bilaterally.  Accessory Clinical Findings  CBC  Recent Labs  03/20/15 0443 03/21/15 0522  WBC 5.6 7.1  HGB 10.7* 10.8*  HCT 32.3* 31.8*  MCV 89.2 89.1  PLT 173 170   Basic Metabolic Panel  Recent Labs  03/19/15 1343 03/19/15 1625 03/20/15 0443  03/20/15 0956 03/20/15 1738  NA 142  --  141  --   --   K 2.5*  --  2.8* 3.1* 3.2*  CL 102  --  105  --   --   CO2 30  --  29  --   --   GLUCOSE 112*  --  110*  --   --   BUN 7  --  12  --   --   CREATININE 1.21  --  1.23  --   --   CALCIUM 9.3  --  8.3*  --   --   MG  --  1.7  --   --   --    Liver Function Tests  Recent Labs  03/20/15 0443  AST 12*  ALT 7*  ALKPHOS 54  BILITOT 0.5  PROT 5.1*  ALBUMIN 2.9*   Cardiac Enzymes  Recent Labs  03/19/15 2031 03/20/15 0104 03/20/15 0710  TROPONINI 0.05* 0.06* 0.03   Fasting Lipid Panel  Recent Labs  03/20/15 0443  CHOL 204*  HDL 29*  LDLCALC 144*  TRIG 155*  CHOLHDL 7.0   TELE NSR without significant  ventricular ectopy    ECG  RBBB with diffuse TWI  Radiology/Studies  Dg Chest 2 View  03/19/2015   CLINICAL DATA:  Chest pain for 2 days.  EXAM: CHEST  2 VIEW  COMPARISON:  PA and lateral chest 05/14/2013.  FINDINGS: The lungs are clear. Heart size is normal. No pneumothorax or pleural effusion. No focal bony abnormality.  IMPRESSION: No acute disease.   Electronically Signed   By: Drusilla Kanner M.D.   On: 03/19/2015 14:17   US Renal  03/21/2015   CLINICAL DATA:  Hydronephrosis.  EXAM: RENAL / URINARY TRACT ULTRASOUND COMPLETE  COMPARISON:  None.  FINDINGS: Right Kidney:  Length: 10.9 cm. Dilated intrarenal calices suggesting mild hydronephrosis. Moderate prominence of extrarenal pelvis. Mild diffuse parenchymal thinning suggesting atrophy.  Left Kidney:  Length: 11.1 cm. Dilated intrarenal collecting system and renal pelvis consistent with hydronephrosis. Diffuse parenchymal thinning suggesting atrophy.  Bladder:  Bladder is decompressed with a Foley catheter in place. There appears to be diffuse bladder wall thickening evaluation is limited due to under distention but this could be due to cystitis or blood clot or bladder neoplasm. Recommend follow-up examination of filled bladder for better evaluation.   IMPRESSION: Mild bilateral hydronephrosis. Bilateral renal atrophy. Foley catheter in the bladder. Appearance of diffuse bladder wall thickening. Evaluation is limited due to under distended bladder but this could be due to cystitis, blood clot, or bladder neoplasm. Follow-up examination of the filled bladder is recommended for better evaluation.   Electronically Signed   By: Burman Nieves M.D.   On: 03/21/2015 02:16    ASSESSMENT AND PLAN  [name] [name] is a 69 y.o. male with a history of hypertension, hyperlipidemia, obesity, depression and CAD s/p recent DES to mRCA (6months ago in [facility]) [facility].   1. Chest pain concerning for angina reminescent of prior angina  - trop 0.05 --> 0.06. EKG RBB with diffuse TWI  - cath 03/20/2015 75% prox to mid LAD with FFR 0.85 suggesting hemodynamic insignificance, 55% OM1, 20% mid LAD. Medical therapy.  - off IV heparin post cath given hematuria. Unfortunately has to continue ASA and plavix given fresh stent  - SCD for DVT prophylaxis  2. CAD  - seen by Dr. Gala Romney in 2006, s/p cath 05/07/2005 revealing a normal left main, a 50-60%/40-50% stenosis in the mid-LAD, diffuse disease in the left circumflex and OM-2, and a 40% lesion in the distal RCA. The LAD was wired using a Doppler flow wire revealing a FFR of 0.93 indicating no flow limiting stenosis. Medical management  - s/p cath 6 month ago and had DES to mid RCA by Dr. Claudette Laws in [facility]. Had a stress test since then which reportedly is ok. Off DAPT for several weeks due to financial reason.  3. Hypokalemia with mild hydronephrosis  - repleted with K, will recheck today. Renal U/S confirms mild hydronephrosis. Urology consulted.   4. Hematuria after foley placement for urinary distension  - discussed with urology Dr. Hillis Range who said to place foley and urology will see during this admission, however did not see any consult note yesterday.   - discussed  with Dr. Berneice Heinrich today, will keep foley in place, nurse to irrigate, urology to see patient today  5. HTN  6. HLD: Chol 204, Trig 155, HDL 29, LDL 144  7. Tobacco abuse  Signed, Azalee Course PA-C Pager: [phone]  I have seen and examined the patient along with Azalee Course  PA-C.  I have reviewed the chart, notes and new data.  I agree with PA's note.  Key new complaints: occasional jaw discomfort, not sure if angina Key examination changes: mild hematuria, no CHF Key new findings / data: cath images/report reviewed, Urology consultation appreciated. Moderate hypokalemia persists. Renin/aldosterone assay pending. Check TTKG.  PLAN: Would keep in house until K fully repleted (hopefully relief of hydronephrosis will help). Continue ASA and clopidogrel for total of 12 months post stent placement. Add imdur. Urology recommendations appreciated.  Thurmon Fair, MD, [facility] [phone] 03/21/2015, 1:52 PM

## 2015-03-22 DIAGNOSIS — N1330 Unspecified hydronephrosis: Secondary | ICD-10-CM | POA: Diagnosis present

## 2015-03-22 DIAGNOSIS — E876 Hypokalemia: Secondary | ICD-10-CM | POA: Diagnosis present

## 2015-03-22 DIAGNOSIS — R338 Other retention of urine: Secondary | ICD-10-CM

## 2015-03-22 DIAGNOSIS — N401 Enlarged prostate with lower urinary tract symptoms: Secondary | ICD-10-CM | POA: Diagnosis present

## 2015-03-22 LAB — BASIC METABOLIC PANEL
ANION GAP: 9 (ref 5–15)
BUN: 8 mg/dL (ref 6–20)
CALCIUM: 8.4 mg/dL — AB (ref 8.9–10.3)
CO2: 26 mmol/L (ref 22–32)
Chloride: 103 mmol/L (ref 101–111)
Creatinine, Ser: 1.25 mg/dL — ABNORMAL HIGH (ref 0.61–1.24)
GFR, EST NON AFRICAN AMERICAN: 57 mL/min — AB (ref >60)
Glucose, Bld: 117 mg/dL — ABNORMAL HIGH (ref 65–99)
POTASSIUM: 3.1 mmol/L — AB (ref 3.5–5.1)
SODIUM: 138 mmol/L (ref 135–145)

## 2015-03-22 LAB — CBC
HCT: 29.9 % — ABNORMAL LOW (ref 39.0–52.0)
Hemoglobin: 10 g/dL — ABNORMAL LOW (ref 13.0–17.0)
MCH: 29.9 pg (ref 26.0–34.0)
MCHC: 33.4 g/dL (ref 30.0–36.0)
MCV: 89.5 fL (ref 78.0–100.0)
PLATELETS: 171 10*3/uL (ref 150–400)
RBC: 3.34 MIL/uL — AB (ref 4.22–5.81)
RDW: 13.9 % (ref 11.5–15.5)
WBC: 6.9 10*3/uL (ref 4.0–10.5)

## 2015-03-22 MED ORDER — POTASSIUM CHLORIDE CRYS ER 20 MEQ PO TBCR
40.0000 meq | EXTENDED_RELEASE_TABLET | Freq: Once | ORAL | Status: AC
  Administered 2015-03-22: 40 meq via ORAL
  Filled 2015-03-22: qty 2

## 2015-03-22 MED ORDER — POTASSIUM CHLORIDE CRYS ER 20 MEQ PO TBCR
40.0000 meq | EXTENDED_RELEASE_TABLET | Freq: Two times a day (BID) | ORAL | Status: DC
  Administered 2015-03-22: 40 meq via ORAL
  Filled 2015-03-22: qty 2

## 2015-03-22 NOTE — Progress Notes (Signed)
CARDIAC REHAB PHASE I   PRE:  Rate/Rhythm: 75 SR    BP: sitting 113/63    SaO2:   MODE:  Ambulation: 550 ft   POST:  Rate/Rhythm: 88 Sr    BP: sitting 130/69     SaO2:   Pt did not get to walk yesterday therefore this was his first walk. Used RW initially however he was able to put it aside and walk second half of 550 ft without it. Fairly steady. Sts he feels weak in general. To recliner. VSS. Needs to walk more today. [phone]   Elissa Lovett [place] CES, ACSM 03/22/2015 11:11 AM

## 2015-03-22 NOTE — Progress Notes (Signed)
Pt states he will like to have foley catheter removed at discharge. RN made him aware to discuss this with his physician in the morning. Cecille Rubin, RN

## 2015-03-22 NOTE — Care Management Important Message (Signed)
Important Message  Patient Details  Name: [name] [name] MRN: [id number] Date of Birth: 02-Oct-1945   Medicare Important Message Given:  Yes-second notification given    Bernadette Hoit 03/22/2015, 10:49 AM

## 2015-03-22 NOTE — Progress Notes (Signed)
RN made pt Urology appt for 9/29 at 915am. Pt is aware

## 2015-03-22 NOTE — Progress Notes (Signed)
Patient Name: [name] [name] Date of Encounter: 03/22/2015  Active Problems:   Dyslipidemia   Essential hypertension   Unstable angina   Chest pain   Abnormal nuclear cardiac imaging test   Length of Stay: 2  SUBJECTIVE  No angina. Hematuria persists.  CURRENT MEDS . aspirin EC  81 mg Oral Daily  . atorvastatin  80 mg Oral q1800  . clopidogrel  75 mg Oral Daily  . finasteride  5 mg Oral Daily  . isosorbide mononitrate  30 mg Oral Daily  . metoprolol  50 mg Oral BID  . pantoprazole  40 mg Oral Q1200  . potassium chloride  40 mEq Oral Once  . potassium chloride  40 mEq Oral BID  . sodium chloride  3 mL Intravenous Q12H  . tamsulosin  0.4 mg Oral QPC supper    OBJECTIVE   Intake/Output Summary (Last 24 hours) at 03/22/15 1054 Last data filed at 03/22/15 0817  Gross per 24 hour  Intake    840 ml  Output   1545 ml  Net   -705 ml   Filed Weights   03/19/15 1328 03/21/15 0500 03/22/15 0446  Weight: 210 lb (95.255 kg) 196 lb 13.9 oz (89.3 kg) 191 lb 8 oz (86.864 kg)    PHYSICAL EXAM Filed Vitals:   03/21/15 2137 03/22/15 0426 03/22/15 0446 03/22/15 1040  BP: 148/74 119/63  113/63  Pulse: 76 67  74  Temp: 97.8 F (36.6 C) 98.6 F (37 C)    TempSrc: Oral Oral    Resp: 19 14    Height:      Weight:   191 lb 8 oz (86.864 kg)   SpO2: 97% 97%     General: Alert, oriented x3, no distress Head: no evidence of trauma, PERRL, EOMI, no exophtalmos or lid lag, no myxedema, no xanthelasma; normal ears, nose and oropharynx Neck: normal jugular venous pulsations and no hepatojugular reflux; brisk carotid pulses without delay and no carotid bruits Chest: clear to auscultation, no signs of consolidation by percussion or palpation, normal fremitus, symmetrical and full respiratory excursions Cardiovascular: normal position and quality of the apical impulse, regular rhythm, normal first and second heart sounds, no rubs or gallops, no murmur Abdomen: no tenderness or distention, no  masses by palpation, no abnormal pulsatility or arterial bruits, normal bowel sounds, no hepatosplenomegaly Extremities: no clubbing, cyanosis or edema; 2+ radial, ulnar and brachial pulses bilaterally; 2+ right femoral, posterior tibial and dorsalis pedis pulses; 2+ left femoral, posterior tibial and dorsalis pedis pulses; no subclavian or femoral bruits Neurological: grossly nonfocal  LABS  CBC  Recent Labs  03/21/15 0522 03/22/15 0533  WBC 7.1 6.9  HGB 10.8* 10.0*  HCT 31.8* 29.9*  MCV 89.1 89.5  PLT 170 171   Basic Metabolic Panel  Recent Labs  03/19/15 1625 03/20/15 0443  03/21/15 0957 03/22/15 0533  NA  --  141  --   --  138  K  --  2.8*  < > 3.1* 3.1*  CL  --  105  --   --  103  CO2  --  29  --   --  26  GLUCOSE  --  110*  --   --  117*  BUN  --  12  --   --  8  CREATININE  --  1.23  --   --  1.25*  CALCIUM  --  8.3*  --   --  8.4*  MG 1.7  --   --   --   --   < > =  values in this interval not displayed. Liver Function Tests  Recent Labs  03/20/15 0443  AST 12*  ALT 7*  ALKPHOS 54  BILITOT 0.5  PROT 5.1*  ALBUMIN 2.9*   No results for input(s): LIPASE, AMYLASE in the last 72 hours. Cardiac Enzymes  Recent Labs  03/19/15 2031 03/20/15 0104 03/20/15 0710  TROPONINI 0.05* 0.06* 0.03   BNP Invalid input(s): POCBNP D-Dimer No results for input(s): DDIMER in the last 72 hours. Hemoglobin A1C  Recent Labs  03/19/15 2031  HGBA1C 5.5   Fasting Lipid Panel  Recent Labs  03/20/15 0443  CHOL 204*  HDL 29*  LDLCALC 144*  TRIG 155*  CHOLHDL 7.0    Radiology Studies Imaging results have been reviewed and US Renal  03/21/2015   CLINICAL DATA:  Hydronephrosis.  EXAM: RENAL / URINARY TRACT ULTRASOUND COMPLETE  COMPARISON:  None.  FINDINGS: Right Kidney:  Length: 10.9 cm. Dilated intrarenal calices suggesting mild hydronephrosis. Moderate prominence of extrarenal pelvis. Mild diffuse parenchymal thinning suggesting atrophy.  Left Kidney:  Length:  11.1 cm. Dilated intrarenal collecting system and renal pelvis consistent with hydronephrosis. Diffuse parenchymal thinning suggesting atrophy.  Bladder:  Bladder is decompressed with a Foley catheter in place. There appears to be diffuse bladder wall thickening evaluation is limited due to under distention but this could be due to cystitis or blood clot or bladder neoplasm. Recommend follow-up examination of filled bladder for better evaluation.  IMPRESSION: Mild bilateral hydronephrosis. Bilateral renal atrophy. Foley catheter in the bladder. Appearance of diffuse bladder wall thickening. Evaluation is limited due to under distended bladder but this could be due to cystitis, blood clot, or bladder neoplasm. Follow-up examination of the filled bladder is recommended for better evaluation.   Electronically Signed   By: Burman Nieves M.D.   On: 03/21/2015 02:16    TELE NSR   ASSESSMENT AND PLAN  CAD No chest pain. Reassuring cardiac cath on 9/21. Continue dual antiplatelet therapy for recent DES.  Urinary retention/hydronephrosis Plan is to DC home with indwelling Foley catheter and urology follow up. Still has hematuria, but no further clogging of catheter with clot.  Hypokalemia Remains hypokalemic. TTKG is 3.6 consistent with inappropriate K renal elimination/tubular disorder. Renin/aldosterone still pending. Not on diuretics/laxatives. Suspect hydronephrosis related tubular disorder. After DC, should probably have outpatient bmet periodically until K level normalizes.  Social issues Currently homeless, plans to go to a motel. Limited financial resources have led to poor medication compliance. Referred to community clinic.  Thurmon Fair, MD, [facility] [phone] office [phone] pager 03/22/2015 10:54 AM

## 2015-03-22 NOTE — Progress Notes (Signed)
Nurse called for K+ of 3.1 has been low since admit (originally 2.5)  rec'd 3 runs of 10 meq on the 20th and 40 meq po 21st  rec'd 160 meq  22nd no K+ He has nausea as well  - we will see  Also mag on the 20th was 1.7.

## 2015-03-23 DIAGNOSIS — R778 Other specified abnormalities of plasma proteins: Secondary | ICD-10-CM | POA: Diagnosis present

## 2015-03-23 DIAGNOSIS — R0789 Other chest pain: Secondary | ICD-10-CM | POA: Insufficient documentation

## 2015-03-23 DIAGNOSIS — Z5900 Homelessness unspecified: Secondary | ICD-10-CM

## 2015-03-23 DIAGNOSIS — N400 Enlarged prostate without lower urinary tract symptoms: Secondary | ICD-10-CM | POA: Diagnosis present

## 2015-03-23 DIAGNOSIS — R7989 Other specified abnormal findings of blood chemistry: Secondary | ICD-10-CM

## 2015-03-23 LAB — CBC
HCT: 28.4 % — ABNORMAL LOW (ref 39.0–52.0)
Hemoglobin: 9.5 g/dL — ABNORMAL LOW (ref 13.0–17.0)
MCH: 30.1 pg (ref 26.0–34.0)
MCHC: 33.5 g/dL (ref 30.0–36.0)
MCV: 89.9 fL (ref 78.0–100.0)
Platelets: 164 10*3/uL (ref 150–400)
RBC: 3.16 MIL/uL — AB (ref 4.22–5.81)
RDW: 14 % (ref 11.5–15.5)
WBC: 7.3 10*3/uL (ref 4.0–10.5)

## 2015-03-23 LAB — BASIC METABOLIC PANEL
ANION GAP: 6 (ref 5–15)
BUN: 8 mg/dL (ref 6–20)
CHLORIDE: 104 mmol/L (ref 101–111)
CO2: 28 mmol/L (ref 22–32)
Calcium: 8.1 mg/dL — ABNORMAL LOW (ref 8.9–10.3)
Creatinine, Ser: 1.11 mg/dL (ref 0.61–1.24)
GFR calc Af Amer: >60 mL/min (ref >60)
GFR calc non Af Amer: >60 mL/min (ref >60)
GLUCOSE: 107 mg/dL — AB (ref 65–99)
POTASSIUM: 3.3 mmol/L — AB (ref 3.5–5.1)
Sodium: 138 mmol/L (ref 135–145)

## 2015-03-23 MED ORDER — POTASSIUM CHLORIDE CRYS ER 20 MEQ PO TBCR
40.0000 meq | EXTENDED_RELEASE_TABLET | Freq: Four times a day (QID) | ORAL | Status: DC
  Administered 2015-03-23 – 2015-03-27 (×17): 40 meq via ORAL
  Filled 2015-03-23 (×16): qty 2

## 2015-03-23 MED ORDER — DULOXETINE HCL 60 MG PO CPEP
60.0000 mg | ORAL_CAPSULE | Freq: Every day | ORAL | Status: DC
  Administered 2015-03-23 – 2015-03-27 (×5): 60 mg via ORAL
  Filled 2015-03-23 (×5): qty 1

## 2015-03-23 MED ORDER — POTASSIUM CHLORIDE CRYS ER 20 MEQ PO TBCR: 40.0000 meq | EXTENDED_RELEASE_TABLET | Freq: Three times a day (TID) | ORAL | Status: DC

## 2015-03-23 NOTE — Progress Notes (Signed)
CARDIAC REHAB PHASE I   PRE:  Rate/Rhythm: 77 sR  BP:  Sitting: 141/84        SaO2: 98 RA  MODE:  Ambulation: 550 ft   POST:  Rate/Rhythm: 84 sR  BP:  Sitting: 129/68         SaO2: 96 RA  Pt states he is feeling "down and depressed" today and is worried that he will have to sleep in his car when he is discharged as he cannot afford to pay for a hotel. Pt also states his cymbalta has not been restarted and he is not sure why. Pt agreeable to ambulate with encouragement. Emotional support given to pt. Pt ambulated 350 ft on RA, foley, assist x1, steady gait, tolerated well. Pt denies CP, dizziness, DOE, declined rest stop. Pt asking about the possibility of going to a shelter for a brief time if necessary at discharge, RN notified, states she will contact social worker to discuss with pt. Pt to recliner after walk, call bell within reach. Will follow-up Monday if pt does not discharge tomorrow.   [id number]  Joylene Grapes, RN, BSN 03/23/2015 3:09 PM

## 2015-03-23 NOTE — Progress Notes (Signed)
RN asked patient if he would like RN to assist him in completing peri care and foley catheter care this morning to help decrease his chance of getting an infection related to his foley catheter and patient stated yes.  RN went to go to wash cloths and returned momentarily.  When RN returned with wash cloths patient stated he would actually like to wait until later this morning.  Per patient I want to try to catch some more zzzz's.   RN reminded patient that his foley catheter care should be completed at least once per shift and RN was going to be going off shift in about thirty minutes or so.  Patient stated understanding and continued refusal at this time.

## 2015-03-23 NOTE — Progress Notes (Signed)
Patient potassium 3.3 this morning.  Cardiology text paged with this information.

## 2015-03-23 NOTE — Progress Notes (Signed)
Patient Name: [name] [name] Date of Encounter: 03/23/2015     Principal Problem:   Unstable angina Active Problems:   Coronary atherosclerosis   Hypokalemia   Homelessness   Elevated troponin   Essential hypertension   Hydronephrosis   Urinary retention due to benign prostatic hyperplasia   BPH (benign prostatic hypertrophy)   Dyslipidemia   DEPRESSION/ANXIETY    SUBJECTIVE  No chest pain or sob.  Ambulated yesterday w/o difficulty.  He is concerned about his ongoing hypokalemia and hematuria and his ability to f/u given his homelessness.  CURRENT MEDS . aspirin EC  81 mg Oral Daily  . atorvastatin  80 mg Oral q1800  . clopidogrel  75 mg Oral Daily  . finasteride  5 mg Oral Daily  . isosorbide mononitrate  30 mg Oral Daily  . metoprolol  50 mg Oral BID  . pantoprazole  40 mg Oral Q1200  . potassium chloride  40 mEq Oral BID  . sodium chloride  3 mL Intravenous Q12H  . tamsulosin  0.4 mg Oral QPC supper    OBJECTIVE  Filed Vitals:   03/22/15 1040 03/22/15 1250 03/22/15 2125 03/23/15 0646  BP: 113/63 107/47 142/70 159/64  Pulse: 74 68 80 86  Temp:  98.5 F (36.9 C) 99.2 F (37.3 C) 98.4 F (36.9 C)  TempSrc:  Oral Oral Oral  Resp:  Height:      Weight:    190 lb 3.2 oz (86.274 kg)  SpO2:  98% 95% 98%    Intake/Output Summary (Last 24 hours) at 03/23/15 0748 Last data filed at 03/23/15 0624  Gross per 24 hour  Intake    920 ml  Output   1400 ml  Net   -480 ml   Filed Weights   03/21/15 0500 03/22/15 0446 03/23/15 0646  Weight: 196 lb 13.9 oz (89.3 kg) 191 lb 8 oz (86.864 kg) 190 lb 3.2 oz (86.274 kg)    PHYSICAL EXAM  General: Pleasant, NAD. Neuro: Alert and oriented X 3. Moves all extremities spontaneously. Psych: Normal affect. HEENT:  Normal  Neck: Supple without bruits or JVD. Lungs:  Resp regular and unlabored, CTA. Heart: RRR no s3, s4, or murmurs. Abdomen: Soft, non-tender, non-distended, BS + x 4.  Extremities: No clubbing,  cyanosis or edema. DP/PT/Radials 2+ and equal bilaterally.  R wrist cath site mildly ecchymotic w/o bleeding/bruit/hematoma.  Accessory Clinical Findings  CBC  Recent Labs  03/22/15 0533 03/23/15 0415  WBC 6.9 7.3  HGB 10.0* 9.5*  HCT 29.9* 28.4*  MCV 89.5 89.9  PLT 171 164   Basic Metabolic Panel  Recent Labs  03/22/15 0533 03/23/15 0415  NA 138 138  K 3.1* 3.3*  CL 103 104  CO2 26 28  GLUCOSE 117* 107*  BUN 8 8  CREATININE 1.25* 1.11  CALCIUM 8.4* 8.1*   TELE  RSR, brief run of SVT late last night.  Radiology/Studies  Dg Chest 2 View  03/19/2015   CLINICAL DATA:  Chest pain for 2 days.  EXAM: CHEST  2 VIEW  COMPARISON:  PA and lateral chest 05/14/2013.  FINDINGS: The lungs are clear. Heart size is normal. No pneumothorax or pleural effusion. No focal bony abnormality.  IMPRESSION: No acute disease.   Electronically Signed   By: Drusilla Kanner M.D.   On: 03/19/2015 14:17   US Renal  03/21/2015   CLINICAL DATA:  Hydronephrosis.  EXAM: RENAL / URINARY TRACT ULTRASOUND COMPLETE  COMPARISON:  None.  FINDINGS: Right Kidney:  Length: 10.9 cm. Dilated intrarenal calices suggesting mild hydronephrosis. Moderate prominence of extrarenal pelvis. Mild diffuse parenchymal thinning suggesting atrophy.  Left Kidney:  Length: 11.1 cm. Dilated intrarenal collecting system and renal pelvis consistent with hydronephrosis. Diffuse parenchymal thinning suggesting atrophy.  Bladder:  Bladder is decompressed with a Foley catheter in place. There appears to be diffuse bladder wall thickening evaluation is limited due to under distention but this could be due to cystitis or blood clot or bladder neoplasm. Recommend follow-up examination of filled bladder for better evaluation.  IMPRESSION: Mild bilateral hydronephrosis. Bilateral renal atrophy. Foley catheter in the bladder. Appearance of diffuse bladder wall thickening. Evaluation is limited due to under distended bladder but this could be due  to cystitis, blood clot, or bladder neoplasm. Follow-up examination of the filled bladder is recommended for better evaluation.   Electronically Signed   By: Burman Nieves M.D.   On: 03/21/2015 02:16    ASSESSMENT AND PLAN  1.  USA/CAD/Elevated Troponin:  Pt presented w/ c/p and was noted to have mild trop elev to a peak of 0.06.  Cath 9/22 revealed moderate non-obstructive dzs, predominantly in the LAD with a nl FFR.  Prev placed prox/mid RCA stent was widely patent.  Med rx recommended.  Cont med Rx w/ asa, statin, plavix, nitrate, bb.  2.  Mild hydronephrosis and hematuria:  Seen by urology, appreciate recs.  Foley in place with plan for outpt urology f/u on 9/29 @ 9:15 AM.  Finasteride and flomax initiated this admission.  Pt does not think that he'll be able to afford his meds until Wednesday at the earliest.  He says he will be able to make f/u appt but has some reservations about going home with foley.  We discussed why a foley is important at this point and he verbalized understanding.  Per CSW, unable to assist with meds due to insurance on file.  3.  Hypokalemia/Inappropriate K renal elimination/tubular disorder:  This continues to be an issue - 3.3 this AM.  Will increase supplementation to 40 meq qid (received 3 doses yesterday).  Renin/aldosterone still pending.  Not on diuretics/laxatives.  Will need close outpt f/u until K normalizes, which will be a challenge given homelessness.  4.  Essential HTN:  BP up this AM but was normal yesterday.  F/U after AM meds.  5.  HL:  LDL 144.  Noncompliant with outpt statin therapy.  Cont statin - may have to switch to something more affordable.  6.  Normocytic Anemia:  H/H drifting down in setting of ongoing hematuria.  9.5/28.4 this AM.  Follow.  7.  Homelessness: At this point, this may be the biggest reason for ongoing hospitalization as he does not think that he'll be able to afford more than one or two nights in a hotel until his October SS  check comes.  This also limits his ability to f/u, which will be crucial in the setting of anemia, hypokalemia, hydronephrosis, and hematuria with plan to d/c with foley in place.  Seen by CSW - no ability to help with meds due to insurance on file.  Signed, Nicolasa Ducking NP  Agree with above, patient seen and examined.  Hopefully be able to discharge in the morning.  Darden Palmer MD [facility]

## 2015-03-23 NOTE — Progress Notes (Signed)
RN completed peri care and foley care for patient.  As RN providing this care, step by step instruction given to patient so if patient goes home with a foley catheter, patient would be able to complete peri care and foley care on himself.  RN instructed patient pertaining to the importance of completing foley care/peri care at least twice per day to decrease the chance of him getting a bladder/urinary tract infection.

## 2015-03-23 NOTE — Progress Notes (Signed)
Patient had two short burst of supraventricular tachycardia.  RN into check on patient and patient asymptomatic.  Cardiology paged.  Hochrein returned page and was updated, no new orders received at this time.

## 2015-03-24 LAB — BASIC METABOLIC PANEL
ANION GAP: 5 (ref 5–15)
BUN: 8 mg/dL (ref 6–20)
CALCIUM: 8.6 mg/dL — AB (ref 8.9–10.3)
CO2: 29 mmol/L (ref 22–32)
Chloride: 105 mmol/L (ref 101–111)
Creatinine, Ser: 1.05 mg/dL (ref 0.61–1.24)
Glucose, Bld: 117 mg/dL — ABNORMAL HIGH (ref 65–99)
POTASSIUM: 4.1 mmol/L (ref 3.5–5.1)
SODIUM: 139 mmol/L (ref 135–145)

## 2015-03-24 LAB — CBC
HCT: 29.2 % — ABNORMAL LOW (ref 39.0–52.0)
Hemoglobin: 9.6 g/dL — ABNORMAL LOW (ref 13.0–17.0)
MCH: 30 pg (ref 26.0–34.0)
MCHC: 32.9 g/dL (ref 30.0–36.0)
MCV: 91.3 fL (ref 78.0–100.0)
PLATELETS: 153 10*3/uL (ref 150–400)
RBC: 3.2 MIL/uL — AB (ref 4.22–5.81)
RDW: 13.9 % (ref 11.5–15.5)
WBC: 6.1 10*3/uL (ref 4.0–10.5)

## 2015-03-24 MED ORDER — PANTOPRAZOLE SODIUM 40 MG PO TBEC: 40.0000 mg | DELAYED_RELEASE_TABLET | Freq: Every day | ORAL | Status: DC

## 2015-03-24 MED ORDER — POTASSIUM CHLORIDE CRYS ER 20 MEQ PO TBCR: 20.0000 meq | EXTENDED_RELEASE_TABLET | Freq: Every day | ORAL | Status: DC

## 2015-03-24 MED ORDER — NITROGLYCERIN 0.4 MG SL SUBL: 0.4000 mg | SUBLINGUAL_TABLET | SUBLINGUAL | Status: DC | PRN

## 2015-03-24 MED ORDER — ISOSORBIDE MONONITRATE ER 30 MG PO TB24: 30.0000 mg | ORAL_TABLET | Freq: Every day | ORAL | Status: DC

## 2015-03-24 MED ORDER — ASPIRIN 81 MG PO TBEC: 81.0000 mg | DELAYED_RELEASE_TABLET | Freq: Every day | ORAL | Status: DC

## 2015-03-24 NOTE — Progress Notes (Signed)
CSW notified by nursing that patient now states that he does not have any money to pay for hotel room and is requesting to remain in the hospital.  CSW met with patient 2 times this afternoon to discuss possible options.  Patient given list of shelters to consider and CSW continues offer for transportation.  Patient became very anxious and stated that he needed time to "think" and requested that CSW return later to talk further. Patient questioned why he could not remain in the hospital and stated that he still felt that he had angina.  Patient, with RN present- asked- "what would you do if I got out of bed and 'collasped' in the floor with a heart attack?  Would you still make me leave?"  CSW and RN provided reassurance and discussed medical issues and need for follow up care but that MD has determined that he is medically stable to leave the hospital.  Due to patient's concerns about angina- RN requested that cardiology check on patient again prior to d/c.  When CSW returned to talk to patient- he has elected to receive transportation to his car- which is currently on the property at the [employer].  He was strongly encouraged to follow up with the [facility] and was given a list of local shelters.  He stated that he is "afraid of the [facility]" but recognizes that he may have to seek shelter there.  Patient states he has 3 children and that he is estranged from each of them but "doesn't really know why".  He states that he has asked for their help and they have all refused.  Patient states that he is overwhelmed with his current state and that he does not feel he has options. He does receive Social Security of $1700 per month and hopes that his check will arrive on Weds.  He can then have a place to stay.  Prior to [place] leaving his room- patient indicated that he may have a friend he can call to see if he can stay with him. Otherwise- he plans to stay in his car.  Nursing notified and will call for [facility] [employer] to transport patient once he  Is cleared by cardiology.  Voucher given to RN. No further CSW needs at this time. CSW signing off.   Lorie Phenix. Pauline Good, [facility]  (weekend coverage)

## 2015-03-24 NOTE — Progress Notes (Signed)
CTSP this afternoon.  He was due for discharge but apparently received news that he does not have enough money in his bank account to pay for a hotel tonight.  This resulted in extreme anxiety, as he was now faced with sleeping in his car, which is in the hotel parking lot.  He then c/o chest pain, that has since resolved spontaneously.  He remains concerned about having to sleep in his car, with a foley in place, and w/o any money to afford his medications.  He will stay tonight but understands that he will be discharged in the AM.

## 2015-03-24 NOTE — Progress Notes (Signed)
Utilization review completed.  

## 2015-03-24 NOTE — Discharge Summary (Signed)
Discharge Summary   Patient ID: Badr Piedra,  MRN: [id number], DOB/AGE: 68-Mar-1947 68 y.o.  Admit date: 03/19/2015 Discharge date: 03/24/2015  Primary Care Kinzy Weyers: [name] Primary Cardiologist: Seen by Dr. Gala Romney in 2006  Discharge Diagnoses Principal Problem:   Unstable angina Active Problems:   Dyslipidemia   DEPRESSION/ANXIETY   Essential hypertension   Coronary atherosclerosis   Hypokalemia   Hydronephrosis   Urinary retention due to benign prostatic hyperplasia   BPH (benign prostatic hypertrophy)   Homelessness   Elevated troponin   Allergies Allergies  Allergen Reactions  . Aspirin     Patient states can take low dose aspirin     Procedures  Renal US 03/20/2015  Conclusion    1. Prox Cx to Mid Cx lesion, 20% stenosed. 2. 1st Mrg lesion, 55% stenosed. 3. Prox LAD to Mid LAD lesion, 75% stenosed. 4. Mid LAD lesion, 20% stenosed.   Widely patent right coronary stent.  Intermediate stenosis in the proximal to mid LAD, with antegrade appearance of 70-80% obstruction. FFR however was 0.85 suggesting hemodynamic insignificance  Irregularities noted in the mid circumflex. No significant obstruction is seen.  Overall normal LV function   RECOMMENDATIONS:    Resume dual antiplatelet therapy when appropriate  Medical management otherwise with aggressive risk factor modification      Cardiac catheterization 03/20/2015 Conclusion    5. Prox Cx to Mid Cx lesion, 20% stenosed. 6. 1st Mrg lesion, 55% stenosed. 7. Prox LAD to Mid LAD lesion, 75% stenosed. 8. Mid LAD lesion, 20% stenosed.   Widely patent right coronary stent.  Intermediate stenosis in the proximal to mid LAD, with antegrade appearance of 70-80% obstruction. FFR however was 0.85 suggesting hemodynamic insignificance  Irregularities noted in the mid circumflex. No significant obstruction is seen.  Overall normal LV function   RECOMMENDATIONS:    Resume dual  antiplatelet therapy when appropriate  Medical management otherwise with aggressive risk factor modification      Hospital Course  The patient is a 69 year old male with past medical history of hypertension, hyperlipidemia, obesity, depression, and CAD status post recent DES to mRCA (6 months ago in [facility] by Dr. Claudette Laws at [facility]). He was previously seen by Dr. Gala Romney in 2006 for chest pain and underwent left heart cath in November 2006 revealing normal left main, 50-60% stenosis in mid LAD, 40% lesion in distal RCA. LAD occlusion at the negative FFR. Therefore he was medically managed. Patient has very complicated social issues, apparently he has been living in a motel for half of year and living inside his car for the other half. His son is a Engineer, civil (consulting) working in the PACU but they have been estranged. He used to be a professor at [employer]. He's only been back in [place] for several days and started having chest discomfort associated with diaphoresis and nausea. He has been on aspirin and Plavix, however has not been compliant with all his medication. Apparently he ran out of money a month ago and stopped taking his medication due to depression. Although aspirin is listed as allergy, however he has been tolerating low-dose aspirin okay. Of note, on arrival, he had significant hypokalemia with K 2.5 which is likely result tubular disorder related to hydronephrosis. Renal ultrasound obtained confirms mild bilateral hydronephrosis, renal atrophy, appearance of diffuse bladder wall thickening.  His symptom was concerning for unstable angina, after discussing various options, patient agreed to undergo diagnostic cardiac catheterization. He underwent diagnostic cath on 03/20/2015 which shows 75% prox to mid LAD  with FFR 0.85 suggesting hemodynamic insignificance, 55% OM1, 20% mid LAD. Medical therapy was recommended with addition of Imdur. Prior to the cath, he complained of distended bladder, bladder  ultrasound showed he had 809 mL of urine, after contacting urology, Foley was placed. He did have some traumatic Foley placement with hematuria while on aspirin, Plavix, and IV heparin at the time. He was seen by urology on 9/22 who recommended to let him go home with the catheter in place with outpatient close urology follow-up. He was restarted on finasteride and tamsulosin. His low potassium was aggressively repleted.  He was seen in the morning of 03/24/2015, his potassium has normalized to 4.1. His renal function is stable. Patient is deemed stable for discharge from cardiology perspective. Unfortunately, given his financial situation, ongoing compliance continued to be probably in the future. We have consulted case manager and social worker, he has been set up with community health for primary care service. He will be discharged with an indwelling Foley catheter with leg bag and instruction, outpatient urology follow-up has been arranged. I will arrange outpatient cardiology follow-up with [facility], but on the long run, given his financial situation, may need to be set up with [facility] Dr. Daleen Squibb.   Discharge Vitals Blood pressure 143/83, pulse 74, temperature 98.2 F (36.8 C), temperature source Oral, resp. rate 18, height  (1.676 m), weight 189 lb 14.4 oz (86.138 kg), SpO2 98 %.  Filed Weights   03/22/15 0446 03/23/15 0646 03/24/15 0641  Weight: 191 lb 8 oz (86.864 kg) 190 lb 3.2 oz (86.274 kg) 189 lb 14.4 oz (86.138 kg)    Labs  CBC  Recent Labs  03/23/15 0415 03/24/15 0550  WBC 7.3 6.1  HGB 9.5* 9.6*  HCT 28.4* 29.2*  MCV 89.9 91.3  PLT 164 153   Basic Metabolic Panel  Recent Labs  03/23/15 0415 03/24/15 0550  NA 138 139  K 3.3* 4.1  CL 104 105  CO2 28 29  GLUCOSE 107* 117*  BUN 8 8  CREATININE 1.11 1.05  CALCIUM 8.1* 8.6*    Disposition  Pt is being discharged home today in good condition.  Follow-up Plans & Appointments      Follow-up Information      Follow up with [facility]. Go on 03/25/2015.   Specialty:  Internal Medicine   Why:  at 3:30pm with Dr Venetia Night for a hospital follow up appointment   Contact information:   [address] [id number] mc [place] [address] [phone]      Follow up with Garnett Farm, MD On 03/28/2015.   Specialty:  Urology   Why:  Please Go To Urologist on 9/29 at 915 for Follow Up   Contact information:   [address] [place] [address] [phone]       Follow up with Olga Millers, MD.   Specialty:  Cardiology   Why:  Please contact the office after discharge to arrange close cardiology followup   Contact information:   [address] [place] [address] [phone]       Discharge Medications    Medication List    STOP taking these medications        atenolol 100 MG tablet  Commonly known as:  TENORMIN     omeprazole 40 MG capsule  Commonly known as:  PRILOSEC  Replaced by:  pantoprazole 40 MG tablet      TAKE these medications  ALPRAZolam 2 MG 24 hr tablet  Commonly known as:  ALPRAZOLAM XR  Take 1 tablet (2 mg total) by mouth every morning.     ALPRAZolam 0.5 MG tablet  Commonly known as:  XANAX  Take 1 tablet (0.5 mg total) by mouth at bedtime as needed for sleep.     aspirin 81 MG EC tablet  Take 1 tablet (81 mg total) by mouth daily.     atorvastatin 40 MG tablet  Commonly known as:  LIPITOR  Take 40 mg by mouth daily.     clopidogrel 75 MG tablet  Commonly known as:  PLAVIX  Take 1 tablet (75 mg total) by mouth daily.     DULoxetine 60 MG capsule  Commonly known as:  CYMBALTA  TAKE 1 CAPSULE (60 MG TOTAL) BY MOUTH DAILY.     finasteride 5 MG tablet  Commonly known as:  PROSCAR  Take 1 tablet (5 mg total) by mouth daily.     isosorbide mononitrate 30 MG 24 hr tablet  Commonly known as:  IMDUR  Take 1 tablet (30 mg total) by mouth daily.     LEVITRA 20 MG tablet  Generic  drug:  vardenafil  TAKE 1 TABLET (20 MG TOTAL) BY MOUTH DAILY AS NEEDED.     metoprolol 50 MG tablet  Commonly known as:  LOPRESSOR  Take 50 mg by mouth 2 (two) times daily.     nitroGLYCERIN 0.4 MG SL tablet  Commonly known as:  NITROSTAT  Place 1 tablet (0.4 mg total) under the tongue every 5 (five) minutes as needed for chest pain.     pantoprazole 40 MG tablet  Commonly known as:  PROTONIX  Take 1 tablet (40 mg total) by mouth daily at 12 noon.     potassium chloride SA 20 MEQ tablet  Commonly known as:  K-DUR,KLOR-CON  Take 1 tablet (20 mEq total) by mouth daily.     tamsulosin 0.4 MG Caps capsule  Commonly known as:  FLOMAX  Take 1 capsule (0.4 mg total) by mouth daily.         Duration of Discharge Encounter   Greater than 30 minutes including physician time.  Ramond Dial PA-C Pager: [phone] 03/24/2015, 8:23 AM  Agree with above, patient seen and examined  W. Viann Fish, Montez Hageman MD [facility]

## 2015-03-24 NOTE — Discharge Instructions (Signed)
Keep foley in until urology followup. Contact urology if has significant amount of blood in the bag. Do not try to remove folley bag yourself.   Please followup with community health for medical need.  No lifting over 5 lbs for 1 week. No sexual activity for 1 week. Keep procedure site clean & dry. If you notice increased pain, swelling, bleeding or pus, call/return!  You may shower, but no soaking baths/hot tubs/pools for 1 week.

## 2015-03-24 NOTE — Progress Notes (Signed)
Weekend CSW notified by unit charge nurse of patient's d/c today and need for a taxi transport. He is currently homeless but is staying at the [facility]. [employer] completed and given to Press photographer. She stated that they would call for transport. No further CSW needs identified. CSW signing off.  Lorri Frederick. Jaci Lazier, [place] [phone] (weekend coverage)

## 2015-03-24 NOTE — Progress Notes (Signed)
Subjective:  No recurrent chest pain overnight.  Objective:  Vital Signs in the last 24 hours: BP 143/83 mmHg  Pulse 74  Temp(Src) 98.2 F (36.8 C) (Oral)  Resp 18  Ht  (1.676 m)  Wt 86.138 kg (189 lb 14.4 oz)  BMI 30.67 kg/m2  SpO2 98%  Physical Exam: Pleasant bearded male in no acute distress Lungs:  Clear Cardiac:  Regular rhythm, normal S1 and S2, no S3 Extremities:  No edema present  Intake/Output from previous day: 09/24 0701 - 09/25 0700 In: 1323 [P.O.:1320; I.V.:3] Out: 1425 [Urine:1425]  Weight Filed Weights   03/22/15 0446 03/23/15 0646 03/24/15 0641  Weight: 86.864 kg (191 lb 8 oz) 86.274 kg (190 lb 3.2 oz) 86.138 kg (189 lb 14.4 oz)    Lab Results: Basic Metabolic Panel:  Recent Labs  16/10/96 0415 03/24/15 0550  NA 138 139  K 3.3* 4.1  CL 104 105  CO2 28 29  GLUCOSE 107* 117*  BUN 8 8  CREATININE 1.11 1.05   CBC:  Recent Labs  03/23/15 0415 03/24/15 0550  WBC 7.3 6.1  HGB 9.5* 9.6*  HCT 28.4* 29.2*  MCV 89.9 91.3  PLT 164 153   Telemetry: Sinus rhythm  Assessment/Plan:  1.  Coronary artery disease with moderate nonobstructive coronary artery disease with patent stent with continued medical therapy 2.  Hypokalemia appears to be resolved 3.  Hydronephrosis hematuria and urologic issues currently stable 4.  Normocytic anemia stable 5.  Homelessness  Recommendations:  From a cardiac viewpoint he could be discharged today.  Social issues remain.      Darden Palmer  MD [facility]  03/24/2015, 7:39 AM

## 2015-03-25 ENCOUNTER — Inpatient Hospital Stay: Payer: Medicare Other | Admitting: Family Medicine

## 2015-03-25 DIAGNOSIS — N2889 Other specified disorders of kidney and ureter: Secondary | ICD-10-CM

## 2015-03-25 DIAGNOSIS — R338 Other retention of urine: Secondary | ICD-10-CM

## 2015-03-25 DIAGNOSIS — F331 Major depressive disorder, recurrent, moderate: Secondary | ICD-10-CM | POA: Diagnosis present

## 2015-03-25 DIAGNOSIS — Z9861 Coronary angioplasty status: Secondary | ICD-10-CM

## 2015-03-25 DIAGNOSIS — R7989 Other specified abnormal findings of blood chemistry: Secondary | ICD-10-CM

## 2015-03-25 DIAGNOSIS — N400 Enlarged prostate without lower urinary tract symptoms: Secondary | ICD-10-CM

## 2015-03-25 DIAGNOSIS — R45851 Suicidal ideations: Secondary | ICD-10-CM

## 2015-03-25 DIAGNOSIS — Z590 Homelessness: Secondary | ICD-10-CM

## 2015-03-25 LAB — BASIC METABOLIC PANEL
ANION GAP: 4 — AB (ref 5–15)
BUN: 8 mg/dL (ref 6–20)
CALCIUM: 8.6 mg/dL — AB (ref 8.9–10.3)
CO2: 28 mmol/L (ref 22–32)
Chloride: 107 mmol/L (ref 101–111)
Creatinine, Ser: 0.92 mg/dL (ref 0.61–1.24)
GFR calc Af Amer: >60 mL/min (ref >60)
GFR calc non Af Amer: >60 mL/min (ref >60)
GLUCOSE: 110 mg/dL — AB (ref 65–99)
Potassium: 4.4 mmol/L (ref 3.5–5.1)
Sodium: 139 mmol/L (ref 135–145)

## 2015-03-25 LAB — ALDOSTERONE + RENIN ACTIVITY W/ RATIO
ALDO / PRA Ratio: <3.2 (ref 0.0–30.0)
Aldosterone: <1 ng/dL (ref 0.0–30.0)
PRA LC/MS/MS: 0.31 ng/mL/hr

## 2015-03-25 MED ORDER — METOPROLOL TARTRATE 100 MG PO TABS: 100.0000 mg | ORAL_TABLET | Freq: Two times a day (BID) | ORAL | Status: DC

## 2015-03-25 MED ORDER — METOPROLOL TARTRATE 100 MG PO TABS
100.0000 mg | ORAL_TABLET | Freq: Two times a day (BID) | ORAL | Status: DC
  Administered 2015-03-25 – 2015-03-27 (×5): 100 mg via ORAL
  Filled 2015-03-25 (×5): qty 1

## 2015-03-25 MED ORDER — CLOPIDOGREL BISULFATE 75 MG PO TABS: 75.0000 mg | ORAL_TABLET | Freq: Every day | ORAL | Status: DC

## 2015-03-25 NOTE — Discharge Summary (Signed)
Discharge Summary   Patient ID: [name] [name],  MRN: [id number], DOB/AGE: 1945-11-11 69 y.o.  Admit date: 03/19/2015 Discharge date: 03/24/2015  Primary Care Provider: [name] Primary Cardiologist: Seen by Dr. Gala Romney in 2006  Discharge Diagnoses Principal Problem:  Unstable angina Active Problems:  Dyslipidemia  DEPRESSION/ANXIETY  Essential hypertension  Coronary atherosclerosis  Hypokalemia  Hydronephrosis  Urinary retention due to benign prostatic hyperplasia  BPH (benign prostatic hypertrophy)  Homelessness  Elevated troponin   Allergies Allergies  Allergen Reactions  . Aspirin     Patient states can take low dose aspirin     Procedures  Renal US 03/20/2015  Conclusion    1. Prox Cx to Mid Cx lesion, 20% stenosed. 2. 1st Mrg lesion, 55% stenosed. 3. Prox LAD to Mid LAD lesion, 75% stenosed. 4. Mid LAD lesion, 20% stenosed.   Widely patent right coronary stent.  Intermediate stenosis in the proximal to mid LAD, with antegrade appearance of 70-80% obstruction. FFR however was 0.85 suggesting hemodynamic insignificance  Irregularities noted in the mid circumflex. No significant obstruction is seen.  Overall normal LV function   RECOMMENDATIONS:    Resume dual antiplatelet therapy when appropriate  Medical management otherwise with aggressive risk factor modification      Cardiac catheterization 03/20/2015 Conclusion    5. Prox Cx to Mid Cx lesion, 20% stenosed. 6. 1st Mrg lesion, 55% stenosed. 7. Prox LAD to Mid LAD lesion, 75% stenosed. 8. Mid LAD lesion, 20% stenosed.   Widely patent right coronary stent.  Intermediate stenosis in the proximal to mid LAD, with antegrade appearance of 70-80% obstruction. FFR however was 0.85 suggesting hemodynamic insignificance  Irregularities noted in the mid circumflex. No significant obstruction is seen.  Overall normal LV  function   RECOMMENDATIONS:    Resume dual antiplatelet therapy when appropriate  Medical management otherwise with aggressive risk factor modification      Hospital Course  The patient is a 69 year old male with past medical history of hypertension, hyperlipidemia, obesity, depression, and CAD status post recent DES to mRCA (6 months ago in [facility] by Dr. Claudette Laws at [facility]). He was previously seen by Dr. Gala Romney in 2006 for chest pain and underwent left heart cath in November 2006 revealing normal left main, 50-60% stenosis in mid LAD, 40% lesion in distal RCA. LAD occlusion at the negative FFR. Therefore he was medically managed. Patient has very complicated social issues, apparently he has been living in a motel for half of year and living inside his car for the other half. His son is a Engineer, civil (consulting) working in the PACU but they have been estranged. He used to be a professor at [employer]. He's only been back in [place] for several days and started having chest discomfort associated with diaphoresis and nausea. He has been on aspirin and Plavix, however has not been compliant with all his medication. Apparently he ran out of money a month ago and stopped taking his medication due to depression. Although aspirin is listed as allergy, however he has been tolerating low-dose aspirin okay. Of note, on arrival, he had significant hypokalemia with K 2.5 which is likely result tubular disorder related to hydronephrosis. Renal ultrasound obtained confirms mild bilateral hydronephrosis, renal atrophy, appearance of diffuse bladder wall thickening.  His symptom was concerning for unstable angina, after discussing various options, patient agreed to undergo diagnostic cardiac catheterization. He underwent diagnostic cath on 03/20/2015 which shows 75% prox to mid LAD with FFR 0.85 suggesting hemodynamic insignificance, 55% OM1, 20% mid  LAD. Medical therapy was recommended with addition of Imdur. Prior to the  cath, he complained of distended bladder, bladder ultrasound showed he had 809 mL of urine, after contacting urology, Foley was placed. He did have some traumatic Foley placement with hematuria while on aspirin, Plavix, and IV heparin at the time. He was seen by urology on 9/22 who recommended to let him go home with the catheter in place with outpatient close urology follow-up. He was restarted on finasteride and tamsulosin. His low potassium was aggressively repleted.  He was seen in the morning of 03/24/2015, his potassium has normalized to 4.1. His renal function is stable. Patient is deemed stable for discharge from cardiology perspective. Unfortunately, given his financial situation, ongoing compliance continued to be probably in the future. We have consulted case manager and social worker, he has been set up with community health for primary care service. He will be discharged with an indwelling Foley catheter with leg bag and instruction, outpatient urology follow-up has been arranged. I will arrange outpatient cardiology follow-up with [facility], but on the long run, given his financial situation, may need to be set up with [facility] Dr. Daleen Squibb.   Discharge Vitals Blood pressure 107/66, pulse 70, temperature 97.5 F (36.4 C), temperature source Oral, resp. rate 18, height  (1.676 m), weight 190 lb 14.7 oz (86.6 kg), SpO2 96 %.  Labs Labs: Lab Results  Component Value Date   WBC 6.1 03/24/2015   HGB 9.6* 03/24/2015   HCT 29.2* 03/24/2015   MCV 91.3 03/24/2015   PLT 153 03/24/2015    Recent Labs Lab 03/20/15 0443  03/25/15 0225  NA 141  < > 139  K 2.8*  < > 4.4  CL 105  < > 107  CO2 29  < > 28  BUN 12  < > 8  CREATININE 1.23  < > 0.92  CALCIUM 8.3*  < > 8.6*  PROT 5.1*  --   --   BILITOT 0.5  --   --   ALKPHOS 54  --   --   ALT 7*  --   --   AST 12*  --   --   GLUCOSE 110*  < > 110*  < > = values in this interval not displayed.  Lab Results  Component Value Date    CHOL 204* 03/20/2015   HDL 29* 03/20/2015   LDLCALC 144* 03/20/2015   TRIG 155* 03/20/2015    Disposition **See below for updated information**  Follow-up Plans & Appointments **See below for updated information**  Duration of Discharge Encounter   Greater than 30 minutes including physician time.  Ramond Dial PA-C Pager: [phone] 03/24/2015, 8:23 AM  Agree with above, patient seen and examined  W. Ashley Royalty MD [facility]      ---------------------------  Addendum 03/25/2015 5:07 PM  See above Discharge Summary by Azalee Course, PA-C for major events of hospitalization. Due to social issues patient remained as inpatient another day. Patient complained of suicidal ideation today and was seen by psychiatry who recommended inpatient psychiatric hospitalization. Patient will be discharged in stable condition to psychiatric facility.  See below for updated discharge medications and follow-up information. Azalee Course PA-C printed out prescriptions for patient to take with him. Recent labs were updated above.   Discharge Medications   Current Discharge Medication List    START taking these medications   Details  aspirin EC 81 MG EC tablet Take 1 tablet (81 mg total) by  mouth daily.    isosorbide mononitrate (IMDUR) 30 MG 24 hr tablet Take 1 tablet (30 mg total) by mouth daily. Qty: 30 tablet, Refills: 11    pantoprazole (PROTONIX) 40 MG tablet Take 1 tablet (40 mg total) by mouth daily at 12 noon. Qty: 30 tablet, Refills: 11    potassium chloride SA (K-DUR,KLOR-CON) 20 MEQ tablet Take 1 tablet (20 mEq total) by mouth daily. Qty: 30 tablet, Refills: 3      CONTINUE these medications which have CHANGED   Details  clopidogrel (PLAVIX) 75 MG tablet Take 1 tablet (75 mg total) by mouth daily. Qty: 30 tablet, Refills: 11    metoprolol (LOPRESSOR) 100 MG tablet Take 1 tablet (100 mg total) by mouth 2 (two) times daily. Qty: 60 tablet, Refills: 5    nitroGLYCERIN  (NITROSTAT) 0.4 MG SL tablet Place 1 tablet (0.4 mg total) under the tongue every 5 (five) minutes as needed for chest pain. Qty: 25 tablet, Refills: 3      CONTINUE these medications which have NOT CHANGED   Details  atorvastatin (LIPITOR) 40 MG tablet Take 40 mg by mouth daily.    finasteride (PROSCAR) 5 MG tablet Take 1 tablet (5 mg total) by mouth daily.     tamsulosin (FLOMAX) 0.4 MG CAPS capsule Take 1 capsule (0.4 mg total) by mouth daily.     ALPRAZolam (ALPRAZOLAM XR) 2 MG 24 hr tablet Take 1 tablet (2 mg total) by mouth every morning.     ALPRAZolam (XANAX) 0.5 MG tablet Take 1 tablet (0.5 mg total) by mouth at bedtime as needed for sleep.     DULoxetine (CYMBALTA) 60 MG capsule TAKE 1 CAPSULE (60 MG TOTAL) BY MOUTH DAILY.     LEVITRA 20 MG tablet TAKE 1 TABLET (20 MG TOTAL) BY MOUTH DAILY AS NEEDED.       STOP taking these medications     omeprazole (PRILOSEC) 40 MG capsule      atenolol (TENORMIN) 100 MG tablet         Disposition   The patient will be discharged in stable condition to home. Discharge Instructions    Amb Referral to Cardiac Rehabilitation    Complete by:  As directed   Congestive Heart Failure: If diagnosis is Heart Failure, patient MUST meet each of the CMS criteria: 1. Left Ventricular Ejection Fraction </= 35% 2. NYHA class II-IV symptoms despite being on optimal heart failure therapy for at least 6 weeks. 3. Stable = have not had a recent (<6 weeks) or planned (<6 months) major cardiovascular hospitalization or procedure  Program Details: - Physician supervised classes - 1-3 classes per week over a 12-18 week period, generally for a total of 36 sessions  Physician Certification: I certify that the above Cardiac Rehabilitation treatment is medically necessary and is medically approved by me for treatment of this patient. The patient is willing and cooperative, able to ambulate and medically stable to participate in exercise  rehabilitation. The participant's progress and Individualized Treatment Plan will be reviewed by the Medical Director, Cardiac Rehab staff and as indicated by the Referring/Ordering Physician.  Diagnosis:  Myocardial Infarction          Follow-up Information    Follow up with [facility]. Go on 04/02/2015.   Specialty:  Internal Medicine   Why:  at 10:30am with Dr Venetia Night for a hospital follow up appointment   Contact information:   [address] [id number] [address] [place] [address]  [phone]      Follow up with Garnett Farm, MD On 03/28/2015.   Specialty:  Urology   Why:  Please Go To Urologist on 9/29 at 915 for Follow Up   Contact information:   [address] [place] [address] [phone]       Follow up with [facility] On 04/16/2015.   Specialty:  Cardiology   Why:  Noralyn Pick PA-C. 2:00PM. Cardiology followup   Contact information:   [address] [place] [address] [phone]       Signed, Ronie Spies PA-C 03/25/2015, 5:05 PM

## 2015-03-25 NOTE — Progress Notes (Signed)
Spoke with [facility] about pt being transported. [facility] states pt can not come to facility with Foley Catheter. Annabelle Harman, [place] notified

## 2015-03-25 NOTE — Care Management Important Message (Signed)
Important Message  Patient Details  Name: [name] [name] MRN: [id number] Date of Birth: January 17, 1946   Medicare Important Message Given:  Yes-third notification given    Orson Aloe 03/25/2015, 3:13 PM

## 2015-03-25 NOTE — Progress Notes (Signed)
Pt awaiting [facility]. Can't go there w/ indwelling foley. I'll order catheter out for voiding trial in am--if he does well w/ that he'll need to go out on tamsulosin 0.4 mg daily as well as finasteride 5 mg daily.

## 2015-03-25 NOTE — Progress Notes (Signed)
CARDIAC REHAB PHASE I   PRE:  Rate/Rhythm: Non-tele  BP:  Sitting: 107/66a        SaO2: 98 RA  MODE:  Ambulation: 700 ft   POST:  Rate/Rhythm: NT  BP:  Sitting: 117/68         SaO2: 95 RA  Pt on suicide precautions, states he thinks he is having panic attacks. Pt agreeable to ambulate. Pt ambulated 700 ft on RA, standby assist, steady gait, tolerated well. Pt denies CP, dizziness, DOE, declined rest stop. Pt states he just spoke with the psychiatrist and is uncertain what the plan is at this time. Pt to bed after walk, call bell within reach. Will follow up tomorrow if pt does not discharge today.  [phone]  Joylene Grapes, RN, BSN 03/25/2015 2:35 PM

## 2015-03-25 NOTE — Progress Notes (Signed)
Patient Name: [name] [name] Date of Encounter: 03/25/2015  Primary Cardiologist: Seen by Dr. Gala Romney in 2006   Principal Problem:   Unstable angina Active Problems:   Dyslipidemia   DEPRESSION/ANXIETY   Essential hypertension   Coronary atherosclerosis   Hypokalemia   Hydronephrosis   Urinary retention due to benign prostatic hyperplasia   BPH (benign prostatic hypertrophy)   Homelessness   Elevated troponin    SUBJECTIVE  Denies any CP or SOB.   CURRENT MEDS . aspirin EC  81 mg Oral Daily  . atorvastatin  80 mg Oral q1800  . clopidogrel  75 mg Oral Daily  . DULoxetine  60 mg Oral Daily  . finasteride  5 mg Oral Daily  . isosorbide mononitrate  30 mg Oral Daily  . metoprolol  50 mg Oral BID  . pantoprazole  40 mg Oral Q1200  . potassium chloride  40 mEq Oral QID  . sodium chloride  3 mL Intravenous Q12H  . tamsulosin  0.4 mg Oral QPC supper    OBJECTIVE  Filed Vitals:   03/24/15 1011 03/24/15 1500 03/24/15 1952 03/25/15 0534  BP: 158/86 132/76 128/62 146/86  Pulse: 79 66 69 73  Temp:  98.2 F (36.8 C) 98.3 F (36.8 C) 98.4 F (36.9 C)  TempSrc:  Oral Oral Oral  Resp:  20    Height:      Weight:    190 lb 14.7 oz (86.6 kg)  SpO2:  98% 98% 94%    Intake/Output Summary (Last 24 hours) at 03/25/15 0830 Last data filed at 03/25/15 0100  Gross per 24 hour  Intake    900 ml  Output    800 ml  Net    100 ml   Filed Weights   03/23/15 0646 03/24/15 0641 03/25/15 0534  Weight: 190 lb 3.2 oz (86.274 kg) 189 lb 14.4 oz (86.138 kg) 190 lb 14.7 oz (86.6 kg)    PHYSICAL EXAM  General: Pleasant, NAD. Neuro: Alert and oriented X 3. Moves all extremities spontaneously. Psych: Normal affect. HEENT:  Normal  Neck: Supple without bruits or JVD. Lungs:  Resp regular and unlabored, CTA. Heart: RRR no s3, s4, or murmurs. Abdomen: Soft, non-tender, non-distended, BS + x 4. Foley bag in place, urine still looks red Extremities: No clubbing, cyanosis or edema.  DP/PT/Radials 2+ and equal bilaterally.  Accessory Clinical Findings  CBC  Recent Labs  03/23/15 0415 03/24/15 0550  WBC 7.3 6.1  HGB 9.5* 9.6*  HCT 28.4* 29.2*  MCV 89.9 91.3  PLT 164 153   Basic Metabolic Panel  Recent Labs  03/24/15 0550 03/25/15 0225  NA 139 139  K 4.1 4.4  CL 105 107  CO2 29 28  GLUCOSE 117* 110*  BUN 8 8  CREATININE 1.05 0.92  CALCIUM 8.6* 8.6*   TELE Off telemetry    ECG  No new EKG   Radiology/Studies  Dg Chest 2 View  03/19/2015   CLINICAL DATA:  Chest pain for 2 days.  EXAM: CHEST  2 VIEW  COMPARISON:  PA and lateral chest 05/14/2013.  FINDINGS: The lungs are clear. Heart size is normal. No pneumothorax or pleural effusion. No focal bony abnormality.  IMPRESSION: No acute disease.   Electronically Signed   By: Drusilla Kanner M.D.   On: 03/19/2015 14:17   US Renal  03/21/2015   CLINICAL DATA:  Hydronephrosis.  EXAM: RENAL / URINARY TRACT ULTRASOUND COMPLETE  COMPARISON:  None.  FINDINGS: Right Kidney:  Length:  10.9 cm. Dilated intrarenal calices suggesting mild hydronephrosis. Moderate prominence of extrarenal pelvis. Mild diffuse parenchymal thinning suggesting atrophy.  Left Kidney:  Length: 11.1 cm. Dilated intrarenal collecting system and renal pelvis consistent with hydronephrosis. Diffuse parenchymal thinning suggesting atrophy.  Bladder:  Bladder is decompressed with a Foley catheter in place. There appears to be diffuse bladder wall thickening evaluation is limited due to under distention but this could be due to cystitis or blood clot or bladder neoplasm. Recommend follow-up examination of filled bladder for better evaluation.  IMPRESSION: Mild bilateral hydronephrosis. Bilateral renal atrophy. Foley catheter in the bladder. Appearance of diffuse bladder wall thickening. Evaluation is limited due to under distended bladder but this could be due to cystitis, blood clot, or bladder neoplasm. Follow-up examination of the filled bladder  is recommended for better evaluation.   Electronically Signed   By: Burman Nieves M.D.   On: 03/21/2015 02:16    ASSESSMENT AND PLAN  [name] [name] is a 69 y.o. male with a history of hypertension, hyperlipidemia, obesity, depression and CAD s/p recent DES to mRCA (6months ago in [facility]) who presents to [facility] today with chest pain.   1. Chest pain concerning for angina reminescent of prior angina - trop 0.05 --> 0.06. EKG RBB with diffuse TWI - cath 03/20/2015 75% prox to mid LAD with FFR 0.85 suggesting hemodynamic insignificance, 55% OM1, 20% mid LAD. Medical therapy. - SCD for DVT prophylaxis. Could not discharge the patient yesterday as he did not want to leave, noted he is living inside his car at this time waiting for social security check on Wed. Today, he says he had some suicidal tendencies then said he's not sure he's suicidal or note. Has outpatient [facility] PCP followup today at 3:30pm.  2. CAD - seen by Dr. Gala Romney in 2006, s/p cath 05/07/2005 revealing a normal left main, a 50-60%/40-50% stenosis in the mid-LAD, diffuse disease in the left circumflex and OM-2, and a 40% lesion in the distal RCA. The LAD was wired using a Doppler flow wire revealing a FFR of 0.93 indicating no flow limiting stenosis. Medical management - s/p cath 6 month ago and had DES to mid RCA by Dr. Claudette Laws in [facility]. Had a stress test since then which reportedly is ok. Off DAPT for several weeks due to financial reason.  3. Hypokalemia with mild hydronephrosis - repleted with KCl  4. Hematuria after foley placement for urinary distension - discussed with urology Dr. Hillis Range who said to place foley and urology will see during this admission, however did not see any consult note yesterday.  - urology consulted, plan for discharge on folley with leg bag and nursing  instructions on how to empty bag. Has outpatient urology followup on 9/29.  5. HTN  6. HLD: Chol 204, Trig 155, HDL 29, LDL 144  7. Tobacco abuse  8. 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  Main concern is safety with d/c to homeless shelter (or worse yet to his car) while he has an indwelling foley. Not sure if he could go to in-patient psych with foley.  Will need to wait for Psych recommendations & probably have Urology make the call about safety  discharging with foley in or out.   From a Cardiology standpoint, he is ready for d/c - but will need to wait for consultant's opinions / recommendations.  Marykay Lex, MD Marykay Lex, M.D., M.S. Interventional Cardiologist   Pager # [phone]

## 2015-03-25 NOTE — Progress Notes (Signed)
Summary: Patient was scheduled for d/c yesterday and planned [facility] with [facility] due to [facility] situation. Patient was provided with Shelter information but adamantly refuses to consider shelter.  He wanted assistance with paying for a room at the [facility] but CSW was unable to facilitate this.  Patient complained to this CSW and unit RN yesterday of increasing "angina" and Cardiac services was notified to reassess patient.  Nursing provided with a cab voucher to transport patient to his car. This morning, patient was noted to still be in the hospital.  CSW, along with unit RNCM met with patient in attempt to ascertain d/c plan. Patient continues to refuse shelter and as d/c was discussed- patient verbalized that he was having suicidal thoughts and ideations. He stated that he has had them "on and off" for almost a year but they have been more prevalent recently since he has worried about his homeless situation. CSW asked patient if he had a "plan" and he indicated that he had "picked out a tree"  (patient has a car).  He stated that his thoughts are vague and he denies any homicidal ideation.    Patient was evaluated by hospital psychiatrist who is now recommending inpatient psych care. [facility] referred to [facility]. He was accepted but then declined once they were notified that patient current has an indwelling catheter with leg bag. Patient had agreed to voluntary commitment to [facility].  Patient requested that CSW contact his 3 children to determine if they might assist him.  Messages left for daughter Rubin Payor, son Elta Guadeloupe (no return call during the day). CSW was able to speak with son Solan Vosler.  Discussed possibility of family providing assistance to help patient with housing; son indicated that his father has "burned all his bridges" with the family and he declined to help patient.   CSW consulted with unit RNCM, Surveyor, quantity of Social Work Depart and the  CM/SW Market researcher also consulted to determine other possible options.  It was suggested to discuss with patient the possibility of placement in an ALF and discussed that he would have to pay his monthly income to the facility.  Patient states he gets $1700.00 per month in social security. He was extremely happy about this option stating that he feels he would thrive better in a setting where he had companionship, support and not have to worry about housing,medication, food and shelter. Patient is ambulatory but states that he has not been able to manage his medical needs well on his own.  His income is too high to qualify for Medicaid in an ALF- thus- would need to locate a facility that would accept him his Social Security Check. Again- patient agreed to this option.  Plan at the end of the day was for short term stay at [facility] and then hopefully placement in an ALF.  It was after 6 pm when CSW was notified that [facility] could not take patient with current foley in place.  MD has been notified and has ordered a voiding trial without the catheter in place.   CSW services will need to initiate an ALF or group home search in case he is unable to go to [facility].  He will also require psychiatric clearance from suicidal thoughts/ideations.    Lorie Phenix. Pauline Good, Laura

## 2015-03-25 NOTE — Consult Note (Signed)
[name] [name]   Reason for [name]:  Depression and suicide ideation Referring Physician:  Dr. Stanford Breed Patient Identification: [name] [name] MRN:  [id number] Principal Diagnosis: Major depressive disorder, recurrent episode, moderate Diagnosis:   Patient Active Problem List   Diagnosis Date Noted  . Midsternal chest pain [R07.2] 03/23/2015  . BPH (benign prostatic hypertrophy) [N40.0] 03/23/2015  . Homelessness [Z59.0] 03/23/2015  . Elevated troponin [R79.89] 03/23/2015  . Hypokalemia [E87.6] 03/22/2015  . Hydronephrosis [N13.30] 03/22/2015  . Urinary retention due to benign prostatic hyperplasia [N28.89, R33.8] 03/22/2015  . Unstable angina [I20.0] 03/19/2015  . Chest pain [R07.9] 03/19/2015  . Abnormal nuclear cardiac imaging test [R93.1] 03/19/2015  . OBESITY, CLASS III [E66.01] 09/04/2008  . EDEMA [R60.9] 08/10/2008  . DERMATOPHYTOSIS OF GROIN AND PERIANAL AREA [B35.6] 12/28/2007  . CAD S/P percutaneous coronary angioplasty - h/p PCI to RCA; Moderate LAD lesion with FFR 0.85 (med Rx) [I25.10, Z98.61] 10/06/2007  . Dyslipidemia [E78.5] 03/29/2007  . DEPRESSION/ANXIETY [F34.1] 03/29/2007  . Essential hypertension [I10] 03/29/2007  . GASTROESOPHAGEAL REFLUX DISEASE [K21.9] 03/29/2007  . ALLERGY [T78.40XA] 03/29/2007  . PROSTATITIS, HX OF [Z87.448] 03/29/2007  . 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 male patient admitted with chest pain/unstable angina.  HPI:  [name] is a 69 y.o. male seen face-to-face for psychiatric consultation and evaluation of suicidal ideation and depression. Patient admitted to [facility] with chest pain and unstable angina. Patient also reported feeling depressed, anxious, withdrawn, isolated, disturbed sleep and appetite and the suicidal thoughts of ending his life. Patient has a history of chronic depression for the last 25  years and was seen at least 3 psychiatrist in [place] and receiving antidepression medication. Patient has no current mental health outpatient services. Patient has conflict with the family members especially 3 children and left to [facility] where he stayed for 2 years and then has decided to come back to [place] about 2 weeks ago since then staying in a [place] [employer] patient admitted to [facility] with unstable angina and chest pain. Patient stated that he has no contact with the children and also reportedly has no money on him. But he receives $1700 Social Security on Wednesday. Patient agrees for inpatient psychiatric hospitalization voluntarily. Patient did criteria for inpatient psychiatric hospitalization as he cannot contract for safety. Patient has no homicidal ideation. Patient has no evidence of psychosis. Case discussed with the unit social service and also psychiatric social service who will be contacting family members for collateral information and also appropriate placement.   HPI Elements:   Location:  Depression and anxiety. Quality:  Poor. Severity:  Chronic. Timing:  Family and financial stresses. Duration:  2-4 weeks. Context:  Psychosocial stresses and family stressors.  Past Medical History:  Past Medical History  Diagnosis Date  . Edema   . Dermatophytosis of groin and perianal area   . CAD (coronary artery disease)     cath '06, repeat '08 non-obstructive disease  . GERD (gastroesophageal reflux disease)   . Hyperlipidemia   . Allergy   . History of prostatitis   . History of BPH   . Hypertension   . Anxiety   . Depression   . Myocardial infarction ~ 08/2014    Past Surgical History  Procedure Laterality Date  . Tonsillectomy and adenoidectomy    . Coronary angioplasty with stent  placement  ~ 08/2014    "1 stent"  . Cardiac catheterization  05/07/2005   . Cardiac catheterization N/A 03/20/2015    Procedure: Left Heart Cath and  Coronary Angiography;  Surgeon: Belva Crome, MD;  Location: [place] [facility];  Service: Cardiovascular;  Laterality: N/A;   Family History:  Family History  Problem Relation Age of Onset  . Stroke Mother   . Coronary artery disease Father   . Heart attack Father   . Cancer Neg Hx     colon or prostate cancer   Social History:  History  Alcohol Use  . Yes    Comment: ocassionally     History  Drug Use No    Social History   Social History  . Marital Status: Divorced    Spouse Name: N/A  . Number of Children: N/A  . Years of Education: N/A   Social History Main Topics  . Smoking status: Former Smoker    Types: Cigars  . Smokeless tobacco: Never Used     Comment: quit smoking in 2010  . Alcohol Use: Yes     Comment: ocassionally  . Drug Use: No  . Sexual Activity:    Partners: Female   Other Topics Concern  . 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 Aspirin     Patient states can take low dose aspirin     Labs:  Results for orders placed or performed during the hospital encounter of 03/19/15 (from the past 48 hour(s))  CBC     Status: Abnormal   Collection Time: 03/24/15  5:50 AM  Result Value Ref Range   WBC 6.1 4.0 - 10.5 K/uL   RBC 3.20 (L) 4.22 - 5.81 MIL/uL   Hemoglobin 9.6 (L) 13.0 - 17.0 g/dL   HCT 29.2 (L) 39.0 - 52.0 %   MCV 91.3 78.0 - 100.0 fL   MCH 30.0 26.0 - 34.0 pg   MCHC 32.9 30.0 - 36.0 g/dL   RDW 13.9 11.5 - 15.5 %   Platelets 153 150 - 400 K/uL  Basic metabolic panel     Status: Abnormal   Collection Time: 03/24/15  5:50 AM  Result Value Ref Range   Sodium 139 135 - 145 mmol/L   Potassium 4.1 3.5 - 5.1 mmol/L    Comment: DELTA CHECK NOTED   Chloride 105 101 - 111 mmol/L   CO2 29 22 - 32 mmol/L    Glucose, Bld 117 (H) 65 - 99 mg/dL   BUN 8 6 - 20 mg/dL   Creatinine, Ser 1.05 0.61 - 1.24 mg/dL   Calcium 8.6 (L) 8.9 - 10.3 mg/dL   GFR calc non Af Amer >60 >60 mL/min   GFR calc Af Amer >60 >60 mL/min    Comment: (NOTE) The eGFR has been calculated using the CKD EPI equation. This calculation has not been validated in all clinical situations. eGFR's persistently <60 mL/min signify possible Chronic Kidney Disease.    Anion gap 5 5 - 15  Basic metabolic panel     Status: Abnormal   Collection Time: 03/25/15  2:25 AM  Result Value Ref Range   Sodium 139 135 - 145 mmol/L   Potassium 4.4 3.5 - 5.1 mmol/L   Chloride 107  101 - 111 mmol/L   CO2 28 22 - 32 mmol/L   Glucose, Bld 110 (H) 65 - 99 mg/dL   BUN 8 6 - 20 mg/dL   Creatinine, Ser 0.92 0.61 - 1.24 mg/dL   Calcium 8.6 (L) 8.9 - 10.3 mg/dL   GFR calc non Af Amer >60 >60 mL/min   GFR calc Af Amer >60 >60 mL/min    Comment: (NOTE) The eGFR has been calculated using the CKD EPI equation. This calculation has not been validated in all clinical situations. eGFR's persistently <60 mL/min signify possible Chronic Kidney Disease.    Anion gap 4 (L) 5 - 15    Vitals: Blood pressure 160/73, pulse 78, temperature 98 F (36.7 C), temperature source Oral, resp. rate 16, height $RemoveBe'5\' 6"'iohfHDLhf$  (1.676 m), weight 86.6 kg (190 lb 14.7 oz), SpO2 97 %.  Risk to Self: Is patient at risk for suicide?: No Risk to Others:   Prior Inpatient Therapy:   Prior Outpatient Therapy:    Current Facility-Administered Medications  Medication Dose Route Frequency Provider Last Rate Last Dose  . 0.9 %  sodium chloride infusion  250 mL Intravenous PRN Belva Crome, MD 10 mL/hr at 03/21/15 0015 250 mL at 03/21/15 0015  . acetaminophen (TYLENOL) tablet 650 mg  650 mg Oral Q4H PRN Belva Crome, MD   650 mg at 03/25/15 0052  . aspirin EC tablet 81 mg  81 mg Oral Daily Eileen Stanford, PA-C   81 mg at 03/25/15 1216  . atorvastatin (LIPITOR) tablet 80 mg  80 mg Oral  q1800 Belva Crome, MD   80 mg at 03/24/15 1701  . clopidogrel (PLAVIX) tablet 75 mg  75 mg Oral Daily Eileen Stanford, PA-C   75 mg at 03/25/15 1059  . DULoxetine (CYMBALTA) DR capsule 60 mg  60 mg Oral Daily Rogelia Mire, NP   60 mg at 03/25/15 1100  . finasteride (PROSCAR) tablet 5 mg  5 mg Oral Daily Eileen Stanford, PA-C   5 mg at 03/25/15 1100  . isosorbide mononitrate (IMDUR) 24 hr tablet 30 mg  30 mg Oral Daily Mihai Croitoru, MD   30 mg at 03/25/15 1100  . metoprolol (LOPRESSOR) tablet 100 mg  100 mg Oral BID Leonie Man, MD   100 mg at 03/25/15 1106  . nitroGLYCERIN (NITROSTAT) SL tablet 0.4 mg  0.4 mg Sublingual Q5 Min x 3 PRN Eileen Stanford, PA-C      . ondansetron [facility]) injection 4 mg  4 mg Intravenous Q6H PRN Eileen Stanford, PA-C      . oxyCODONE-acetaminophen (PERCOCET/ROXICET) 5-325 MG per tablet 1-2 tablet  1-2 tablet Oral Q4H PRN Belva Crome, MD   2 tablet at 03/22/15 0500  . pantoprazole (PROTONIX) EC tablet 40 mg  40 mg Oral Q1200 Eileen Stanford, PA-C   40 mg at 03/25/15 1216  . potassium chloride SA (K-DUR,KLOR-CON) CR tablet 40 mEq  40 mEq Oral QID Rogelia Mire, NP   40 mEq at 03/25/15 1059  . sodium chloride 0.9 % injection 3 mL  3 mL Intravenous Q12H Belva Crome, MD   3 mL at 03/24/15 1013  . sodium chloride 0.9 % injection 3 mL  3 mL Intravenous PRN Belva Crome, MD   3 mL at 03/22/15 2142  . tamsulosin (FLOMAX) capsule 0.4 mg  0.4 mg Oral QPC supper Eileen Stanford, PA-C   0.4 mg at 03/24/15 1701  Musculoskeletal: Strength & Muscle Tone: within normal limits Gait & Station: normal Patient leans: N/A  Psychiatric Specialty Exam: Physical Exam as per history and physical   ROS depression and anxiety mild memory deficits.  No Fever-chills, No Headache, No changes with Vision or hearing, reports vertigo No problems swallowing food or Liquids, No Chest pain, Cough or Shortness of Breath, No Abdominal pain, No Nausea or  Vommitting, Bowel movements are regular, No Blood in stool or Urine, No dysuria, No new skin rashes or bruises, No new joints pains-aches,  No new weakness, tingling, numbness in any extremity, No recent weight gain or loss, No polyuria, polydypsia or polyphagia,   A full 10 point Review of Systems was done, except as stated above, all other Review of Systems were negative.  Blood pressure 160/73, pulse 78, temperature 98 F (36.7 C), temperature source Oral, resp. rate 16, height $RemoveBe'5\' 6"'jGyPTqeKv$  (1.676 m), weight 86.6 kg (190 lb 14.7 oz), SpO2 97 %.Body mass index is 30.83 kg/(m^2).  General Appearance: Casual  Eye Contact::  Good  Speech:  Blocked and Slow  Volume:  Normal  Mood:  Anxious, Depressed and Worthless  Affect:  Constricted and Depressed  Thought Process:  Coherent and Goal Directed  Orientation:  Full (Time, Place, and Person)  Thought Content:  WDL  Suicidal Thoughts:  Yes.  with intent/plan  Homicidal Thoughts:  No  Memory:  Immediate;   Fair Recent;   Fair  Judgement:  Intact  Insight:  Fair  Psychomotor Activity:  Restlessness  Concentration:  Fair  Recall:  [employer] of Knowledge:Good  Language: Good  Akathisia:  Negative  Handed:  Right  AIMS (if indicated):     Assets:  Communication Skills Desire for Improvement Leisure Time  ADL's:  Intact  Cognition: WNL  Sleep:      Medical Decision Making: Review of Psycho-Social Stressors (1), Review or order clinical lab tests (1), Discuss test with performing physician (1), Established Problem, Worsening (2), Review of Last Therapy Session (1), Review or order medicine tests (1), Review of Medication Regimen & Side Effects (2) and Review of New Medication or Change in Dosage (2)  Treatment Plan Summary: Daily contact with patient to assess and evaluate symptoms and progress in treatment and Medication management  Plan:  Continue safety monitoring, contact family members for collateral information Continue  Cymbalta 60 mg daily for depression Recommend psychiatric Inpatient admission when medically cleared. Supportive therapy provided about ongoing stressors.  Disposition: Patient will be referred to the psychiatric social service regarding appropriate inpatient hospitalization as patient cannot contract for safety.   JONNALAGADDA,JANARDHAHA R. 03/25/2015 12:56 PM

## 2015-03-25 NOTE — Progress Notes (Addendum)
Received notification from nursing that psych facility will not take patient if he has indwelling foley. D/w Dr. Herbie Baltimore. We obviously have concerns about safety of sending patient home to homeless situation with indwelling foley and suicidal ideation. D/w urologist on call, Dr. Mena Goes, who would like to further discuss situation with the MD who originally saw the patient in consultation, Dr. Retta Diones. He said he would try to get in contact with Dr. Retta Diones and will leave a brief note - tomorrow's cardiology team should try to get in touch with Dr. Retta Diones for further recommendations. I passed on information to 3W nurse helping to cover at the moment as his nurse is in a contact room. Dayna Dunn PA-C   Addendum: Dr. Mena Goes called back to report that Dr. Retta Diones would be coming by to leave further recommendations. Really appreciate urology's input, thanks! Dayna Dunn PA-C

## 2015-03-26 DIAGNOSIS — N1330 Unspecified hydronephrosis: Secondary | ICD-10-CM

## 2015-03-26 MED ORDER — ZOLPIDEM TARTRATE 5 MG PO TABS
5.0000 mg | ORAL_TABLET | Freq: Every evening | ORAL | Status: DC | PRN
  Administered 2015-03-26: 5 mg via ORAL
  Filled 2015-03-26: qty 1

## 2015-03-26 MED ORDER — TUBERCULIN PPD 5 UNIT/0.1ML ID SOLN
5.0000 [IU] | Freq: Once | INTRADERMAL | Status: DC
  Administered 2015-03-26: 5 [IU] via INTRADERMAL
  Filled 2015-03-26: qty 0.1

## 2015-03-26 NOTE — Progress Notes (Signed)
The patient's Foley catheter was removed today.  He has been voiding independently.  His last postvoid residual was 200 mL's.  This is an acceptable PVR.the patient should continue on Flomax.  He should also be prompted to void every 4 hours if possible.  A postvoid residual of 200 should not prevent him from going to behavioral health.  The patient has follow-up on 03/28/15 with [facility] at 9:30 AM.

## 2015-03-26 NOTE — Progress Notes (Signed)
CARDIAC REHAB PHASE I   PRE:  Rate/Rhythm: off monitor  BP:  Supine: 108/64  Sitting:   Standing:    SaO2: 93%RA  MODE:  Ambulation: 550 ft   POST:  Rate/Rhythm: 69 per pulse ox  BP:  Supine:   Sitting: 94/51  Standing:    SaO2: 94%RA 1425-1450 Pt walked 550 ft with asst x 1 with steady gait. No CP but did c/o feeling a little lightheaded. BP lower after walk. Encouraged pt to make sure he gets up slowly. Pt had many questions re CRP 2 which were answered; he seems excited to have referral.   Luetta Nutting, RN BSN  03/26/2015 2:46 PM

## 2015-03-26 NOTE — Progress Notes (Signed)
CSW (Clinical Child psychotherapist) notified pt will likely need placement at ALF as [facility] will not be able to meet pt needs. CSW visited pt room and confirmed again he is agreeable to placement. Pt would prefer [place] facility but agreeable to CSW looking outside of this area if needed. Pt understanding that ALF will require payment and that CSW will ask facilities to provide information regarding payment and pt social security check if they are able to offer a bed.  Poonum Ambelal, LCSW [phone]

## 2015-03-26 NOTE — Clinical Social Work Psych Note (Addendum)
1:50pm- patient continues to have bed at [facility] however voiding trial has not been as successful as hoped.  Covering unit CSW is attempting to find ALF placement as patient is agreeable.  Psych CSW will continue to assist as necessary.  8:20am- Pt has been accepted to [id number] pending successful voiding trial (not before 2pm per [facility] request) RN to call report prior to transfer to [id number] Patient will be transferred via [facility] as patient is voluntary at this time.  RN has been updated and is agreeable to contacting psych CSW at the expiration of the patient's voiding trial for [facility]  placement.    Vickii Penna, LCSW [phone]  Psychiatric & Orthopedics (5N 1-8) Clinical Social Worker

## 2015-03-26 NOTE — Progress Notes (Signed)
CSW (Clinical Child psychotherapist) called multiple assisted living facilities to see if they can assist with placement for pt. Pt does not have Medicaid established and therefore is limited in facility options even with private pay. CSW contacted [facility], [place]. [place], and all area [place] facilities and each facility base rate is out of pt income range. [facility] did notify CSW that CSW could fax FL2 with medications to confirm if special rate is possible to accommodate pt income. CSW also spoke with Crystal at [facility] care who informed CSW their facility bases payment on pt income so they may be able to accept pt. CSW will fax clinicals and await return phone calls.   Poonum Ambelal, LCSW [phone]

## 2015-03-26 NOTE — Progress Notes (Signed)
Patient Name: Kishaun Erekson Date of Encounter: 03/26/2015  Primary Cardiologist: Seen by Dr. Gala Romney in 2006   Principal Problem:   Major depressive disorder, recurrent episode, moderate Active Problems:   Dyslipidemia   DEPRESSION/ANXIETY   Essential hypertension   CAD S/P percutaneous coronary angioplasty - h/p PCI to RCA; Moderate LAD lesion with FFR 0.85 (med Rx)   Unstable angina   Hypokalemia   Hydronephrosis   Urinary retention due to benign prostatic hyperplasia   BPH (benign prostatic hypertrophy)   Homelessness   Elevated troponin    SUBJECTIVE  Denies any CP or SOB.   CURRENT MEDS . aspirin EC  81 mg Oral Daily  . atorvastatin  80 mg Oral q1800  . clopidogrel  75 mg Oral Daily  . DULoxetine  60 mg Oral Daily  . finasteride  5 mg Oral Daily  . isosorbide mononitrate  30 mg Oral Daily  . metoprolol  100 mg Oral BID  . pantoprazole  40 mg Oral Q1200  . potassium chloride  40 mEq Oral QID  . sodium chloride  3 mL Intravenous Q12H  . tamsulosin  0.4 mg Oral QPC supper  . tuberculin  5 Units Intradermal Once    OBJECTIVE  Filed Vitals:   03/25/15 0945 03/25/15 1422 03/25/15 2034 03/26/15 0545  BP: 160/73 107/66 132/71 149/78  Pulse: 78 70 67 69  Temp: 98 F (36.7 C) 97.5 F (36.4 C) 98.5 F (36.9 C) 98.3 F (36.8 C)  TempSrc: Oral Oral Oral Oral  Resp: Height:      Weight:    188 lb (85.276 kg)  SpO2: 97% 96% 95% 97%    Intake/Output Summary (Last 24 hours) at 03/26/15 1338 Last data filed at 03/26/15 1100  Gross per 24 hour  Intake   3720 ml  Output   3433 ml  Net    287 ml   Filed Weights   03/24/15 0641 03/25/15 0534 03/26/15 0545  Weight: 189 lb 14.4 oz (86.138 kg) 190 lb 14.7 oz (86.6 kg) 188 lb (85.276 kg)    PHYSICAL EXAM  General: Pleasant, NAD. Neuro: Alert and oriented X 3. Moves all extremities spontaneously. Psych: Normal affect. HEENT:  Normal  Neck: Supple without bruits or JVD. Lungs:  Resp regular and  unlabored, CTA. Heart: RRR no s3, s4, or murmurs. Abdomen: Soft, non-tender, non-distended, BS + x 4. Foley removed Extremities: No clubbing, cyanosis or edema. DP/PT/Radials 2+ and equal bilaterally.  Accessory Clinical Findings  CBC  Recent Labs  03/24/15 0550  WBC 6.1  HGB 9.6*  HCT 29.2*  MCV 91.3  PLT 153   Basic Metabolic Panel  Recent Labs  03/24/15 0550 03/25/15 0225  NA 139 139  K 4.1 4.4  CL 105 107  CO2 29 28  GLUCOSE 117* 110*  BUN 8 8  CREATININE 1.05 0.92  CALCIUM 8.6* 8.6*   TELE Off telemetry    ECG  No new EKG   Radiology/Studies  Dg Chest 2 View  03/19/2015   CLINICAL DATA:  Chest pain for 2 days.  EXAM: CHEST  2 VIEW  COMPARISON:  PA and lateral chest 05/14/2013.  FINDINGS: The lungs are clear. Heart size is normal. No pneumothorax or pleural effusion. No focal bony abnormality.  IMPRESSION: No acute disease.   Electronically Signed   By: Drusilla Kanner M.D.   On: 03/19/2015 14:17   US Renal  03/21/2015   CLINICAL DATA:  Hydronephrosis.  EXAM:  RENAL / URINARY TRACT ULTRASOUND COMPLETE  COMPARISON:  None.  FINDINGS: Right Kidney:  Length: 10.9 cm. Dilated intrarenal calices suggesting mild hydronephrosis. Moderate prominence of extrarenal pelvis. Mild diffuse parenchymal thinning suggesting atrophy.  Left Kidney:  Length: 11.1 cm. Dilated intrarenal collecting system and renal pelvis consistent with hydronephrosis. Diffuse parenchymal thinning suggesting atrophy.  Bladder:  Bladder is decompressed with a Foley catheter in place. There appears to be diffuse bladder wall thickening evaluation is limited due to under distention but this could be due to cystitis or blood clot or bladder neoplasm. Recommend follow-up examination of filled bladder for better evaluation.  IMPRESSION: Mild bilateral hydronephrosis. Bilateral renal atrophy. Foley catheter in the bladder. Appearance of diffuse bladder wall thickening. Evaluation is limited due to under  distended bladder but this could be due to cystitis, blood clot, or bladder neoplasm. Follow-up examination of the filled bladder is recommended for better evaluation.   Electronically Signed   By: Burman Nieves M.D.   On: 03/21/2015 02:16    ASSESSMENT AND PLAN  Riel Hirschman is a 69 y.o. male with a history of hypertension, hyperlipidemia, obesity, depression and CAD s/p recent DES to mRCA (6months ago in [facility]) who presents to [facility] today with chest pain.   1. Chest pain concerning for angina reminescent of prior angina - trop 0.05 --> 0.06. EKG RBB with diffuse TWI - cath 03/20/2015 75% prox to mid LAD with FFR 0.85 suggesting hemodynamic insignificance, 55% OM1, 20% mid LAD. Medical therapy. - cardiac issue resolved.  2. CAD - seen by Dr. Gala Romney in 2006, s/p cath 05/07/2005 revealing a normal left main, a 50-60%/40-50% stenosis in the mid-LAD, diffuse disease in the left circumflex and OM-2, and a 40% lesion in the distal RCA. The LAD was wired using a Doppler flow wire revealing a FFR of 0.93 indicating no flow limiting stenosis. Medical management - s/p cath 6 month ago and had DES to mid RCA by Dr. Claudette Laws in [facility]. Had a stress test since then which reportedly is ok. Off DAPT for several weeks due to financial reason. -- Should be restarted. Okay to use Plavix  3. Hypokalemia with mild hydronephrosis - repleted with KCl  4. Hematuria after foley placement for urinary distension - discussed with urology Dr. Hillis Range who said to place foley and urology will see during this admission, however did not see any consult note yesterday.  - urology consulted, plan for discharge on [facility]ley with leg bag and nursing instructions on how to empty bag. Has outpatient urology followup on 9/29. -- Appreciate prompt attention last night by on-call urology. Orders were placed to remove the Foley  and attempt bladder scan with voiding trial.  5. Suicidal ideation: seen by psychiatry, initially agree to inpatient psychiatric admission, but pt has indwelling foley and could not be admitted to psych. Checked with urology, foley removed, doing trial of urination today. So far, only urinated very little <71ml, nurse did bladder scan, will contact Urology if >556ml noted. Case manager is trying to set up ALF on discharge  6. HTN  7. HLD: Chol 204, Trig 155, HDL 29, LDL 144  8. Tobacco abuse  9. 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 MENG,  PA-C.  The patient remains stable from a cardiac standpoint. Issues from yesterday were noted. Appreciate the prompt attention by urology for making recommendations last night.  Currently doing a voiding trial and plan for follow-up bladder scan.  If we are unable to have successful voiding protocol, he may very well need to be evaluated for assisted living discharge due to concern for safety and discharge the patient with indwelling Foley catheter. Next an otherwise I think he should be stable for discharge to the psychiatric facility per psych consultation.  No active, ongoing cardiac issues. Pending resolution of his voiding concerns, he should be ready for discharge either to assisted living versus a psychiatric facility.   Marykay Lex, M.D., M.S. Interventional Cardiologist   Pager # [phone]

## 2015-03-26 NOTE — Progress Notes (Signed)
Tuberculin injection administered into patients left anterior forearm at 1300. Pt tolerated well.

## 2015-03-27 ENCOUNTER — Inpatient Hospital Stay (HOSPITAL_COMMUNITY)
Admission: AD | Admit: 2015-03-27 | Discharge: 2015-04-09 | DRG: 885 | Disposition: A | Discharge status: Home / Self Care | Payer: Medicare PPO | Source: Intra-hospital | Attending: Psychiatry | Admitting: Psychiatry

## 2015-03-27 ENCOUNTER — Encounter (HOSPITAL_COMMUNITY): Payer: Self-pay | Admitting: *Deleted

## 2015-03-27 DIAGNOSIS — N401 Enlarged prostate with lower urinary tract symptoms: Secondary | ICD-10-CM | POA: Diagnosis present

## 2015-03-27 DIAGNOSIS — I252 Old myocardial infarction: Secondary | ICD-10-CM

## 2015-03-27 DIAGNOSIS — Z590 Homelessness: Secondary | ICD-10-CM

## 2015-03-27 DIAGNOSIS — Z818 Family history of other mental and behavioral disorders: Secondary | ICD-10-CM | POA: Diagnosis not present

## 2015-03-27 DIAGNOSIS — E785 Hyperlipidemia, unspecified: Secondary | ICD-10-CM | POA: Diagnosis present

## 2015-03-27 DIAGNOSIS — I10 Essential (primary) hypertension: Secondary | ICD-10-CM | POA: Diagnosis present

## 2015-03-27 DIAGNOSIS — Z823 Family history of stroke: Secondary | ICD-10-CM

## 2015-03-27 DIAGNOSIS — F331 Major depressive disorder, recurrent, moderate: Principal | ICD-10-CM | POA: Diagnosis present

## 2015-03-27 DIAGNOSIS — I2510 Atherosclerotic heart disease of native coronary artery without angina pectoris: Secondary | ICD-10-CM | POA: Diagnosis not present

## 2015-03-27 DIAGNOSIS — Z955 Presence of coronary angioplasty implant and graft: Secondary | ICD-10-CM | POA: Diagnosis not present

## 2015-03-27 DIAGNOSIS — R32 Unspecified urinary incontinence: Secondary | ICD-10-CM | POA: Diagnosis present

## 2015-03-27 DIAGNOSIS — R45851 Suicidal ideations: Secondary | ICD-10-CM | POA: Diagnosis present

## 2015-03-27 DIAGNOSIS — Z8249 Family history of ischemic heart disease and other diseases of the circulatory system: Secondary | ICD-10-CM | POA: Diagnosis not present

## 2015-03-27 DIAGNOSIS — G4700 Insomnia, unspecified: Secondary | ICD-10-CM | POA: Diagnosis present

## 2015-03-27 DIAGNOSIS — Z8042 Family history of malignant neoplasm of prostate: Secondary | ICD-10-CM | POA: Diagnosis not present

## 2015-03-27 DIAGNOSIS — Z87891 Personal history of nicotine dependence: Secondary | ICD-10-CM | POA: Diagnosis not present

## 2015-03-27 DIAGNOSIS — R7989 Other specified abnormal findings of blood chemistry: Secondary | ICD-10-CM | POA: Diagnosis not present

## 2015-03-27 DIAGNOSIS — N400 Enlarged prostate without lower urinary tract symptoms: Secondary | ICD-10-CM | POA: Diagnosis not present

## 2015-03-27 MED ORDER — NITROGLYCERIN 0.4 MG SL SUBL
0.4000 mg | SUBLINGUAL_TABLET | SUBLINGUAL | Status: DC | PRN
  Filled 2015-03-27: qty 1

## 2015-03-27 MED ORDER — ISOSORBIDE MONONITRATE ER 30 MG PO TB24
30.0000 mg | ORAL_TABLET | Freq: Every day | ORAL | Status: DC
  Administered 2015-03-28 – 2015-04-09 (×13): 30 mg via ORAL
  Filled 2015-03-27 (×15): qty 1

## 2015-03-27 MED ORDER — METOPROLOL TARTRATE 100 MG PO TABS
100.0000 mg | ORAL_TABLET | Freq: Two times a day (BID) | ORAL | Status: DC
  Administered 2015-03-28 – 2015-04-01 (×8): 100 mg via ORAL
  Filled 2015-03-27 (×14): qty 1

## 2015-03-27 MED ORDER — CLOPIDOGREL BISULFATE 75 MG PO TABS
75.0000 mg | ORAL_TABLET | Freq: Every day | ORAL | Status: DC
  Administered 2015-03-28 – 2015-04-09 (×13): 75 mg via ORAL
  Filled 2015-03-27 (×15): qty 1

## 2015-03-27 MED ORDER — FINASTERIDE 5 MG PO TABS
5.0000 mg | ORAL_TABLET | Freq: Every day | ORAL | Status: DC
  Administered 2015-03-28 – 2015-04-09 (×13): 5 mg via ORAL
  Filled 2015-03-27 (×16): qty 1

## 2015-03-27 MED ORDER — ALPRAZOLAM ER 0.5 MG PO TB24: 2.0000 mg | ORAL_TABLET | ORAL | Status: DC

## 2015-03-27 MED ORDER — TRAZODONE HCL 50 MG PO TABS
50.0000 mg | ORAL_TABLET | Freq: Every evening | ORAL | Status: DC | PRN
  Filled 2015-03-27 (×5): qty 1

## 2015-03-27 MED ORDER — POTASSIUM CHLORIDE CRYS ER 20 MEQ PO TBCR
20.0000 meq | EXTENDED_RELEASE_TABLET | Freq: Every day | ORAL | Status: DC
  Administered 2015-03-28 – 2015-04-09 (×13): 20 meq via ORAL
  Filled 2015-03-27 (×15): qty 1

## 2015-03-27 MED ORDER — PANTOPRAZOLE SODIUM 40 MG PO TBEC
40.0000 mg | DELAYED_RELEASE_TABLET | Freq: Every day | ORAL | Status: DC
  Administered 2015-03-28 – 2015-04-09 (×13): 40 mg via ORAL
  Filled 2015-03-27 (×15): qty 1

## 2015-03-27 MED ORDER — ATORVASTATIN CALCIUM 40 MG PO TABS
40.0000 mg | ORAL_TABLET | Freq: Every day | ORAL | Status: DC
  Administered 2015-03-28 – 2015-04-09 (×13): 40 mg via ORAL
  Filled 2015-03-27 (×15): qty 1

## 2015-03-27 MED ORDER — ALPRAZOLAM 0.5 MG PO TABS: 0.5000 mg | ORAL_TABLET | Freq: Every evening | ORAL | Status: DC | PRN

## 2015-03-27 MED ORDER — TAMSULOSIN HCL 0.4 MG PO CAPS
0.4000 mg | ORAL_CAPSULE | Freq: Every day | ORAL | Status: DC
  Administered 2015-03-28 – 2015-04-01 (×5): 0.4 mg via ORAL
  Filled 2015-03-27 (×7): qty 1

## 2015-03-27 MED ORDER — MAGNESIUM HYDROXIDE 400 MG/5ML PO SUSP: 30.0000 mL | Freq: Every day | ORAL | Status: DC | PRN

## 2015-03-27 MED ORDER — ALUM & MAG HYDROXIDE-SIMETH 200-200-20 MG/5ML PO SUSP: 30.0000 mL | ORAL | Status: DC | PRN

## 2015-03-27 MED ORDER — DULOXETINE HCL 60 MG PO CPEP
60.0000 mg | ORAL_CAPSULE | Freq: Every day | ORAL | Status: DC
Start: 2015-03-28 — End: 2015-04-02
  Administered 2015-03-28 – 2015-04-02 (×6): 60 mg via ORAL
  Filled 2015-03-27 (×8): qty 1

## 2015-03-27 MED ORDER — ENSURE ENLIVE PO LIQD
237.0000 mL | Freq: Two times a day (BID) | ORAL | Status: DC
  Administered 2015-03-28 – 2015-04-09 (×26): 237 mL via ORAL

## 2015-03-27 MED ORDER — ASPIRIN EC 81 MG PO TBEC
81.0000 mg | DELAYED_RELEASE_TABLET | Freq: Every day | ORAL | Status: DC
  Administered 2015-03-28 – 2015-04-09 (×13): 81 mg via ORAL
  Filled 2015-03-27 (×15): qty 1

## 2015-03-27 MED ORDER — ACETAMINOPHEN 325 MG PO TABS
650.0000 mg | ORAL_TABLET | Freq: Four times a day (QID) | ORAL | Status: DC | PRN
  Administered 2015-04-02 – 2015-04-06 (×5): 650 mg via ORAL
  Filled 2015-03-27 (×5): qty 2

## 2015-03-27 NOTE — Progress Notes (Signed)
Pt discharge to [facility] by [facility] transportation [facility]. Pt alert and oriented at time of discharge. Geronimo Boot, RN

## 2015-03-27 NOTE — Tx Team (Signed)
Initial Interdisciplinary Treatment Plan   PATIENT STRESSORS: Financial difficulties Health problems Medication change or noncompliance   PATIENT STRENGTHS: Ability for insight Active sense of humor Average or above average intelligence Communication skills General fund of knowledge Motivation for treatment/growth   PROBLEM LIST: Problem List/Patient Goals Date to be addressed Date deferred Reason deferred Estimated date of resolution  " Help with my depression" 03/27/2015     "Help with my anxiety" 03/27/2015     "Would like to link up with my kids." 03/27/2015     "Find housing, Assisted housing." 03/27/2015                                    DISCHARGE CRITERIA:  Adequate post-discharge living arrangements Improved stabilization in mood, thinking, and/or behavior Need for constant or close observation no longer present Safe-care adequate arrangements made  PRELIMINARY DISCHARGE PLAN: Placement in alternative living arrangements  PATIENT/FAMIILY INVOLVEMENT: This treatment plan has been presented to and reviewed with the patient, [name] [name].  The patient and family have been given the opportunity to ask questions and make suggestions.  Karren Burly 03/27/2015, 4:21 PM

## 2015-03-27 NOTE — Progress Notes (Signed)
Subjective: No CP or SOB.  Sitter in room for suicidal ideation.   Objective: Vital signs in last 24 hours: Temp:  [98 F (36.7 C)-99.1 F (37.3 C)] 98 F (36.7 C) (09/28 0630) Pulse Rate:  [71-75] 75 (09/28 0630) Resp:  [16] 16 (09/28 0630) BP: (131-149)/(76-83) 149/83 mmHg (09/28 0630) SpO2:  [95 %-97 %] 97 % (09/28 0630) Last BM Date: 03/25/15  Intake/Output from previous day: 09/27 0701 - 09/28 0700 In: 320 [P.O.:320] Out: 2622 [Urine:2622] Intake/Output this shift:    Medications Scheduled Meds: . aspirin EC  81 mg Oral Daily  . atorvastatin  80 mg Oral q1800  . clopidogrel  75 mg Oral Daily  . DULoxetine  60 mg Oral Daily  . finasteride  5 mg Oral Daily  . isosorbide mononitrate  30 mg Oral Daily  . metoprolol  100 mg Oral BID  . pantoprazole  40 mg Oral Q1200  . potassium chloride  40 mEq Oral QID  . sodium chloride  3 mL Intravenous Q12H  . tamsulosin  0.4 mg Oral QPC supper  . tuberculin  5 Units Intradermal Once   Continuous Infusions:  PRN Meds:.sodium chloride, acetaminophen, nitroGLYCERIN, ondansetron (ZOFRAN) IV, oxyCODONE-acetaminophen, sodium chloride, zolpidem  PE: General appearance: alert, cooperative and no distress Lungs: clear to auscultation bilaterally Heart: regular rate and rhythm, S1, S2 normal, no murmur, click, rub or gallop Extremities: No LEE Pulses: 2+ and symmetric Skin: Warm and dry Neurologic: Grossly normal  Lab Results:  No results for input(s): WBC, HGB, HCT, PLT in the last 72 hours. BMET  Recent Labs  03/25/15 0225  NA 139  K 4.4  CL 107  CO2 28  GLUCOSE 110*  BUN 8  CREATININE 0.92  CALCIUM 8.6*     Assessment/Plan   Principal Problem:   Major depressive disorder, recurrent episode, moderate Active Problems:   Dyslipidemia   DEPRESSION/ANXIETY   Essential hypertension   CAD S/P percutaneous coronary angioplasty - h/p PCI to RCA; Moderate LAD lesion with FFR 0.85 (med Rx)   Unstable angina  Hypokalemia   Hydronephrosis   Urinary retention due to benign prostatic hyperplasia   BPH (benign prostatic hypertrophy)   Homelessness   Elevated troponin  [name] [name] is a 69 y.o. male with a history of hypertension, hyperlipidemia, obesity, depression and CAD s/p recent DES to mRCA (6months ago in [facility]) who presents to [facility] today with chest pain.   1. Chest pain concerning for angina reminescent of prior angina - trop 0.05 --> 0.06. EKG RBB with diffuse TWI - cath 03/20/2015 75% prox to mid LAD with FFR 0.85 suggesting hemodynamic insignificance, 55% OM1, 20% mid LAD. Medical therapy. - cardiac issue resolved.  2. CAD seen by Dr. Gala Romney in 2006, s/p cath 05/07/2005 revealing a normal left main, a 50-60%/40-50% stenosis in the mid-LAD, diffuse disease in the left circumflex and OM-2, and a 40% lesion in the distal RCA. The LAD was wired using a Doppler flow wire revealing a FFR of 0.93 indicating no flow limiting stenosis. Medical management s/p cath 6 month ago and had DES to mid RCA by Dr. Claudette Laws in [facility]. Had a stress test since then which reportedly is ok. Off DAPT for several weeks due to financial reason. -- Should be restarted. Okay to use Plavix  3. Hypokalemia with mild hydronephrosis - repleted with KCl  4. Hematuria after foley placement for urinary distension Discussed with urology Dr. Hillis Range who said to place foley and urology  will see during this admission,  See psychiatry problem below Orders were placed to remove the Foley and attempt bladder scan with voiding trial. Seen by urology yesterday afternoon at 1600hrs and the patient was voiding independently.  Postvoid residual . Follow up arranged.   UOP last 24 hours  5. Suicidal ideation:  seen by psychiatry, initially agree to inpatient psychiatric admission, but pt has indwelling foley and could not be admitted to psych. Checked with urology, foley  removed, doing trial of urination today. So far, only urinated very little <3ml, nurse did bladder scan, will contact Urology if >544ml noted. Case manager is trying to set up ALF on discharge.   I spoke to [place] with Social Work.  She is checking bed status at behavioral health. The patient is stable for discharge the that facility.  6. HTN  7. HLD: Chol 204, Trig 155, HDL 29, LDL 144  8. Tobacco abuse  9. 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

## 2015-03-27 NOTE — Discharge Planning (Addendum)
Patient will discharge today per MD order. Patient will discharge to: [facility] RN to call report prior to transportation to: [id number] Transportation: [facility] scheduled for [facility] CSW updated patient, MD, RN, unit CSW and RNCM.  Vickii Penna, LCSWA [phone]  Psychiatric & Orthopedics (5N 1-16) Clinical Social Worker

## 2015-03-27 NOTE — Progress Notes (Signed)
CSW (Clinical Child psychotherapist) aware pt will be discharging to [facility]. CSW notified ALFs that had been contacted that pt is no longer needing placement. CSW signing off.  Poonum Ambelal, LCSW [phone]

## 2015-03-27 NOTE — Progress Notes (Signed)
Admission Note: Pt was admitted to room 407-1 after skin search and assessment in search room. Pt reports that he has had intermittent thoughts of self harm over past couple months.  "It comes and goes, I don't have a plan...don't have the guts to follow through."  " I am estranged from my 3 children that live [place] in [place], I call them every other day and leave a messege.  Pt reports that he was staying at the [facility] and is concerned about his car, wondering if it is still there.  I need help, I would really like to move to an Assisted Living Home to get more support.  Noted that pt is logical and coherent, but several times lost concentration during assessment.  "I forget things and so when I remember, I just need to tell someone immediately.  My concentration is getting worse, maybe it is ADD or Dementia."  Pt did tell this RN that there is one friend (a former co-worker from [employer]) named Roque Cash that he feels that he could reach out to if ever he needed to, currently does not have his number.  Pt. has appt. with Urologist in morning 03/28/2015 at 0915 that will have be postponed as pt is unable to go.  This RN passed onto night RN to have pt call and leave a message regarding such.

## 2015-03-27 NOTE — Discharge Summary (Signed)
Physician Discharge Summary     Cardiologist: Bensimhon.  Last seen 2006 Patient ID: [name] [name] MRN: [id number] DOB/AGE: 1946/06/28 69 y.o.  Admit date: 03/19/2015 Discharge date: 03/27/2015  Admission Diagnoses:  Unstable angina  Discharge Diagnoses:  Principal Problem:   Major depressive disorder, recurrent episode, moderate Active Problems:   Dyslipidemia   DEPRESSION/ANXIETY   Essential hypertension   CAD S/P percutaneous coronary angioplasty - h/p PCI to RCA; Moderate LAD lesion with FFR 0.85 (med Rx)   Unstable angina   Hypokalemia   Hydronephrosis   Urinary retention due to benign prostatic hyperplasia   BPH (benign prostatic hypertrophy)   Homelessness   Elevated troponin     Discharged Condition: stable  Hospital Course:   [name] [name] is a 69 y.o. male with a history of hypertension, hyperlipidemia, obesity, depression and CAD s/p recent DES to mRCA (6months ago in [facility]) who presents to [facility] today with chest pain.   Mr. Stockard was seen by Dr. Gala Romney in 2006 for chest pain and underwent left heart cardiac catheterization on11/02/2005 revealing a normal left main, a 50-60%/40-50% stenosis in the mid-LAD, diffuse disease in the left circumflex and OM-2, and a 40% lesion in the distal RCA. The LAD was wired using a Doppler flow wire revealing a FFR of 0.93 indicating no flow limiting stenosis. It was decided, at that point to medically manage him.   Since that time he has been back and forth between here and [facility]. 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 He apparently had a heart attack ~ 6 months ago and had a Promes DES to his mRCA by Dr. Claudette Laws at [facility]. He has been back in [place] for a few days and staying in a Motel and started having chest pain yesterday morning. It has been coming and going and is not related to exertion. It is associated with diaphoresis and nausea. It radiates to his jaw. He has been on ASA/Plavix but has not been  complaint with all of his medications. About a month ago he ran out of money and stopped taking his medications due to a "depression". There is an allergy listed to ASA but he has been tolerating baby ASA fine. He has now been complaint for the past month. No SOB, orthopnea, PND or LE edema. Upon further questioning it turns out he may have had a stress test since his last heart cath/PCI which was reportedly okay.   Patient was admitted observation cardiac enzymes were cycled troponin peaked at 0.06.  He was continued on aspirin, Plavix, statin. IV heparin was added.  Potassium was replaced during the admission but required multiple by mouth doses and IV runs before it normalized.   magnesium was 1.7. The following day was taken for cardiac catheterization which revealed a prox Cx to Mid Cx lesion, 20% stenosed, 1st Mrg lesion, 55% stenosed. Prox LAD to Mid LAD lesion, 75% stenosed. Mid LAD lesion, 20% stenosed.  He had a widely patent stent in the right coronary artery.  FFR of the LAD was 0.5.  He had normal LV function.  Medical management advised. On September 21 patient was complaining of incontinence symptoms and seemed to have bladder distention.  Foley catheter was placed and he was noted to have hematuria.  Urology was consulted.  He was started on finasteride and tamsulosin for BPH.  Dr. Retta Diones felt the hematuria would clear as it was likely related to Foley insertion and being on heparin.  At  that time it was felt he should go home with the Foley catheter in place.  Renal ultrasound showed mild bilateral hydronephrosis, bilateral renal atrophy, diffuse bladder wall thickening.   TTKG is 3.6 consistent with inappropriate K renal elimination/tubular disorder. Social issues are a concern as the patient is currently homeless and has plans to go to a Motel. His financial resources are limited and likely results in poor medical compliance.  Patient was supposed to been discharged on September 25.  Patient  became extremely anxious because he was not having enough money was pink To pay for his hotel.  He then started complaining of chest pain.  He was kept 1 additional night with anticipation of discharge the following day.  On the 26th the patient was noted to have suicidal ideation. Psychiatry was consulted. They recommended admission to behavioral health however, since he had a Foley catheter they could not take him. We talked urology who recommended discontinuing the Foley and see how he voids.  He ultimately was voiding fine with less than 200 mL residual.  Follow-up with urology has been arranged.  Patient ambulated 550 feet with cardiac rehabilitation steady gait. Questions regarding phase II cardiac rehabilitation were answered. The patient was seen by Dr. Herbie Baltimore who felt he was stable for [facility].      Consults: Urology, cardiac rehabilitation  Significant Diagnostic Studies:  RENAL / URINARY TRACT ULTRASOUND COMPLETE  COMPARISON: None.  FINDINGS: Right Kidney:  Length: 10.9 cm. Dilated intrarenal calices suggesting mild hydronephrosis. Moderate prominence of extrarenal pelvis. Mild diffuse parenchymal thinning suggesting atrophy.  Left Kidney:  Length: 11.1 cm. Dilated intrarenal collecting system and renal pelvis consistent with hydronephrosis. Diffuse parenchymal thinning suggesting atrophy.  Bladder:  Bladder is decompressed with a Foley catheter in place. There appears to be diffuse bladder wall thickening evaluation is limited due to under distention but this could be due to cystitis or blood clot or bladder neoplasm. Recommend follow-up examination of filled bladder for better evaluation.  IMPRESSION: Mild bilateral hydronephrosis. Bilateral renal atrophy. Foley catheter in the bladder. Appearance of diffuse bladder wall thickening. Evaluation is limited due to under distended bladder but this could be due to cystitis, blood clot, or bladder  neoplasm. Follow-up examination of the filled bladder is recommended for better evaluation.   Left heart catheterization Conclusion    1. Prox Cx to Mid Cx lesion, 20% stenosed. 2. 1st Mrg lesion, 55% stenosed. 3. Prox LAD to Mid LAD lesion, 75% stenosed. 4. Mid LAD lesion, 20% stenosed.   Widely patent right coronary stent.  Intermediate stenosis in the proximal to mid LAD, with antegrade appearance of 70-80% obstruction. FFR however was 0.85 suggesting hemodynamic insignificance  Irregularities noted in the mid circumflex. No significant obstruction is seen.  Overall normal LV function   RECOMMENDATIONS:    Resume dual antiplatelet therapy when appropriate  Medical management otherwise with aggressive risk factor modification     Coronary Findings    Dominance: Right   Left Anterior Descending  The vessel is small .   Marland Kitchen Prox LAD to Mid LAD lesion, 75% stenosed. diffuse eccentric .   Marland Kitchen Mid LAD lesion, 20% stenosed. Pressure wire/FFR was performed on the lesion. FFR: 0.85.   Marland Kitchen First Diagonal Branch   The vessel is small in size.     Left Circumflex  The vessel is small .   Marland Kitchen Prox Cx to Mid Cx lesion, 20% stenosed. diffuse tubular eccentric .   Marland Kitchen First Obtuse Marginal  Branch   . 1st Mrg lesion, 55% stenosed. diffuse eccentric .   Marland Kitchen Third Obtuse Marginal Branch   The vessel is small in size.     Right Coronary Artery   . Prox RCA to Mid RCA lesion, 0% stenosed. Previously placed Prox RCA to Mid RCA stent (unknown type) is patent.        Diagnostic Diagram           Treatments: See above  Discharge Exam: Blood pressure 149/83, pulse 75, temperature 98 F (36.7 C), temperature source Oral, resp. rate 16, height  (1.676 m), weight 188 lb (85.276 kg), SpO2 97 %.   Disposition: 01-Home or Self Care      Discharge Instructions    Amb Referral to Cardiac Rehabilitation    Complete by:  As directed   Congestive Heart Failure: If  diagnosis is Heart Failure, patient MUST meet each of the CMS criteria: 1. Left Ventricular Ejection Fraction </= 35% 2. NYHA class II-IV symptoms despite being on optimal heart failure therapy for at least 6 weeks. 3. Stable = have not had a recent (<6 weeks) or planned (<6 months) major cardiovascular hospitalization or procedure  Program Details: - Physician supervised classes - 1-3 classes per week over a 12-18 week period, generally for a total of 36 sessions  Physician Certification: I certify that the above Cardiac Rehabilitation treatment is medically necessary and is medically approved by me for treatment of this patient. The patient is willing and cooperative, able to ambulate and medically stable to participate in exercise rehabilitation. The participant's progress and Individualized Treatment Plan will be reviewed by the Medical Director, Cardiac Rehab staff and as indicated by the Referring/Ordering Physician.  Diagnosis:  Myocardial Infarction     Diet - low sodium heart healthy    Complete by:  As directed      Increase activity slowly    Complete by:  As directed             Medication List    STOP taking these medications        atenolol 100 MG tablet  Commonly known as:  TENORMIN     omeprazole 40 MG capsule  Commonly known as:  PRILOSEC  Replaced by:  pantoprazole 40 MG tablet      TAKE these medications        ALPRAZolam 2 MG 24 hr tablet  Commonly known as:  ALPRAZOLAM XR  Take 1 tablet (2 mg total) by mouth every morning.     ALPRAZolam 0.5 MG tablet  Commonly known as:  XANAX  Take 1 tablet (0.5 mg total) by mouth at bedtime as needed for sleep.     aspirin 81 MG EC tablet  Take 1 tablet (81 mg total) by mouth daily.     atorvastatin 40 MG tablet  Commonly known as:  LIPITOR  Take 40 mg by mouth daily.     clopidogrel 75 MG tablet  Commonly known as:  PLAVIX  Take 1 tablet (75 mg total) by mouth daily.     DULoxetine 60 MG capsule    Commonly known as:  CYMBALTA  TAKE 1 CAPSULE (60 MG TOTAL) BY MOUTH DAILY.     finasteride 5 MG tablet  Commonly known as:  PROSCAR  Take 1 tablet (5 mg total) by mouth daily.     isosorbide mononitrate 30 MG 24 hr tablet  Commonly known as:  IMDUR  Take 1 tablet (30 mg total) by mouth  daily.     LEVITRA 20 MG tablet  Generic drug:  vardenafil  TAKE 1 TABLET (20 MG TOTAL) BY MOUTH DAILY AS NEEDED.     metoprolol 100 MG tablet  Commonly known as:  LOPRESSOR  Take 1 tablet (100 mg total) by mouth 2 (two) times daily.     nitroGLYCERIN 0.4 MG SL tablet  Commonly known as:  NITROSTAT  Place 1 tablet (0.4 mg total) under the tongue every 5 (five) minutes as needed for chest pain.     pantoprazole 40 MG tablet  Commonly known as:  PROTONIX  Take 1 tablet (40 mg total) by mouth daily at 12 noon.     potassium chloride SA 20 MEQ tablet  Commonly known as:  K-DUR,KLOR-CON  Take 1 tablet (20 mEq total) by mouth daily.     tamsulosin 0.4 MG Caps capsule  Commonly known as:  FLOMAX  Take 1 capsule (0.4 mg total) by mouth daily.       Follow-up Information    Follow up with [facility]. Go on 04/02/2015.   Specialty:  Internal Medicine   Why:  at 10:30am with Dr Venetia Night for a hospital follow up appointment   Contact information:   [address] [id number] [address] [place] [address] [phone]      Follow up with Garnett Farm, MD On 03/28/2015.   Specialty:  Urology   Why:  Please Go To Urologist on 9/29 at 915 for Follow Up   Contact information:   [address] [place] [address] [phone]       Follow up with [facility] On 04/16/2015.   Specialty:  Cardiology   Why:  Noralyn Pick PA-C. 2:00PM. Cardiology followup   Contact information:   [address] [place] [address] [phone]     Greater than 30 minutes was spent completing the patient's discharge.     SignedWilburt Finlay, PAC 03/27/2015, 1:00 PM  Patient has remained stable from a cardiac standpoint. NO active CAD issues. BP & HR stable on current meds. Back on Plavix. Has intermediate LAD lesion, so needs aggressive RF modification. Appears to be voiding with foley out - Again, appreciate Urology assistance.   Hopefully able to d/c to Assisted Living vs. Behavioral Health TODAY.   Marykay Lex, M.D., M.S. Interventional Cardiologist   Pager # [phone]

## 2015-03-28 ENCOUNTER — Encounter (HOSPITAL_COMMUNITY): Payer: Self-pay | Admitting: Psychiatry

## 2015-03-28 DIAGNOSIS — F331 Major depressive disorder, recurrent, moderate: Principal | ICD-10-CM

## 2015-03-28 LAB — URINALYSIS W MICROSCOPIC (NOT AT ARMC)
Bilirubin Urine: NEGATIVE
Glucose, UA: NEGATIVE mg/dL
Ketones, ur: NEGATIVE mg/dL
LEUKOCYTES UA: NEGATIVE
NITRITE: NEGATIVE
PROTEIN: 100 mg/dL — AB
SPECIFIC GRAVITY, URINE: 1.012 (ref 1.005–1.030)
Urobilinogen, UA: 0.2 mg/dL (ref 0.0–1.0)
pH: 5.5 (ref 5.0–8.0)

## 2015-03-28 MED ORDER — TRAZODONE HCL 50 MG PO TABS
25.0000 mg | ORAL_TABLET | Freq: Every evening | ORAL | Status: DC | PRN
  Administered 2015-03-28 – 2015-04-08 (×12): 25 mg via ORAL
  Filled 2015-03-28 (×23): qty 1

## 2015-03-28 MED ORDER — BUSPIRONE HCL 5 MG PO TABS
5.0000 mg | ORAL_TABLET | Freq: Two times a day (BID) | ORAL | Status: DC
  Administered 2015-03-28 – 2015-03-31 (×7): 5 mg via ORAL
  Filled 2015-03-28 (×10): qty 1

## 2015-03-28 MED ORDER — ALPRAZOLAM ER 0.5 MG PO TB24
1.0000 mg | ORAL_TABLET | ORAL | Status: DC
  Administered 2015-03-29: 1 mg via ORAL
  Filled 2015-03-28 (×2): qty 2

## 2015-03-28 MED ORDER — TUBERCULIN PPD 5 UNIT/0.1ML ID SOLN
5.0000 [IU] | Freq: Once | INTRADERMAL | Status: AC
  Administered 2015-03-29: 5 [IU] via INTRADERMAL

## 2015-03-28 NOTE — [facility]HH Suicide Risk Assessment (Signed)
[facility] Admission Suicide Risk Assessment   Nursing information obtained from:  Patient Demographic factors:  Caucasian Current Mental Status:  Self-harm thoughts Loss Factors:  Loss of significant relationship, Decline in physical health Historical Factors:   (Hx of depression with "intermittent suicidal thoughts.") Risk Reduction Factors:  Positive social support, Positive therapeutic relationship Total Time spent with patient: 45 minutes Principal Problem:  Major Depression, Recurrent, no psychotic features, severe  Diagnosis:   Patient Active Problem List   Diagnosis Date Noted  . MDD (major depressive disorder), recurrent episode, moderate [F33.1] 03/27/2015  . Major depressive disorder, recurrent episode, moderate [F33.1] 03/25/2015  . Midsternal chest pain [R07.2] 03/23/2015  . BPH (benign prostatic hypertrophy) [N40.0] 03/23/2015  . Homelessness [Z59.0] 03/23/2015  . Elevated troponin [R79.89] 03/23/2015  . Hypokalemia [E87.6] 03/22/2015  . Hydronephrosis [N13.30] 03/22/2015  . Urinary retention due to benign prostatic hyperplasia [N28.89, R33.8] 03/22/2015  . Unstable angina [I20.0] 03/19/2015  . Chest pain [R07.9] 03/19/2015  . Abnormal nuclear cardiac imaging test [R93.1] 03/19/2015  . OBESITY, CLASS III [E66.01] 09/04/2008  . EDEMA [R60.9] 08/10/2008  . DERMATOPHYTOSIS OF GROIN AND PERIANAL AREA [B35.6] 12/28/2007  . CAD S/P percutaneous coronary angioplasty - h/p PCI to RCA; Moderate LAD lesion with FFR 0.85 (med Rx) [I25.10, Z98.61] 10/06/2007  . Dyslipidemia [E78.5] 03/29/2007  . DEPRESSION/ANXIETY [F34.1] 03/29/2007  . Essential hypertension [I10] 03/29/2007  . GASTROESOPHAGEAL REFLUX DISEASE [K21.9] 03/29/2007  . ALLERGY [T78.40XA] 03/29/2007  . PROSTATITIS, HX OF [Z87.448] 03/29/2007  . 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  World Science writer [facility]). Score between 0-7:  no or low risk or alcohol related problems. Score between 8-15:  moderate risk of alcohol related problems. Score between 16-19:  high risk of alcohol related problems. Score 20 or above:  warrants further diagnostic evaluation for alcohol dependence and treatment.   CLINICAL FACTORS:  69 year old male, recent medical admission for episode of chest pain , reported depression and SI, due to which he was referred to inpatient psychiatric unit upon discharge/medical clearance .    Psychiatric Specialty Exam: Physical Exam  ROS  Blood pressure 101/57, pulse 83, temperature 97.3 F (36.3 C), temperature source Oral, resp. rate 18, height 5' 5.25" (1.657 m), weight 198 lb (89.812 kg).Body mass index is 32.71 kg/(m^2).  See admit note MSE                                                        COGNITIVE FEATURES THAT CONTRIBUTE TO RISK:  Loss of executive function    SUICIDE RISK:   Moderate:  Frequent suicidal ideation with limited intensity, and duration, some specificity in terms of plans, no associated intent, good self-control, limited dysphoria/symptomatology, some risk factors present, and identifiable protective factors, including available and accessible social support.  PLAN OF CARE: Patient will be admitted to inpatient psychiatric unit for stabilization and safety. Will provide and encourage milieu participation. Provide medication management and maked adjustments as needed.  Will follow daily.    Medical Decision Making:  Review of Psycho-Social Stressors (1), Review or order clinical lab tests (1), Established Problem, Worsening (2) and Review of  New Medication or Change in Dosage (2)  I certify that inpatient services furnished can reasonably be expected to improve the patient's condition.   COBOS, FERNANDO 03/28/2015, 2:58  PM

## 2015-03-28 NOTE — Tx Team (Signed)
Interdisciplinary Treatment Plan Update (Adult) Date: 03/28/2015   Date: 03/28/2015 2:20 PM  Progress in Treatment:  Attending groups: Yes  Participating in groups: Yes  Taking medication as prescribed: Yes  Tolerating medication: Yes  Family/Significant othe contact made: No, CSW attempting to contact daughter Patient understands diagnosis: Yes Discussing patient identified problems/goals with staff: Yes  Medical problems stabilized or resolved: Yes  Denies suicidal/homicidal ideation: Yes Patient has not harmed self or Others: Yes   New problem(s) identified: None identified at this time.   Discharge Plan or Barriers: CSW will assess for appropriate discharge plan and relevant barriers.   Additional comments: n/a   Reason for Continuation of Hospitalization:  Depression Medication stabilization Suicidal ideation  Estimated length of stay: 3-5 days  Review of initial/current patient goals per problem list:   1.  Goal(s): Patient will participate in aftercare plan  Met:  No  Target date: 3-5 days from date of admission   As evidenced by: Patient will participate within aftercare plan AEB aftercare provider and housing plan at discharge being identified.  03/28/15: CSW to work with Pt to assess for appropriate discharge plan and faciliate appointments and referrals as needed prior to d/c.  2.  Goal (s): Patient will exhibit decreased depressive symptoms and suicidal ideations.  Met:  No  Target date: 3-5 days from date of admission   As evidenced by: Patient will utilize self rating of depression at 3 or below and demonstrate decreased signs of depression or be deemed stable for discharge by MD. 03/28/15: Pt was admitted with symptoms of depression, rating 10/10. Pt continues to present with flat affect and depressive symptoms.  Pt will demonstrate decreased symptoms of depression and rate depression at 3/10 or lower prior to discharge.  Attendees:  Patient:    Family:     Physician: Dr. Parke Poisson, MD  03/28/2015 2:20 PM  Nursing: Lars Pinks, RN Case manager  03/28/2015 2:20 PM  Clinical Social Worker Peri Maris, Latanya Presser, MSW 03/28/2015 2:20 PM  Other: Lucinda Dell, Beverly Sessions Liasion 03/28/2015 2:20 PM  Clinical: Gaylan Gerold, RN 03/28/2015 2:20 PM  Other: , RN Charge Nurse 03/28/2015 2:20 PM  Other:     Peri Maris, [place] MSW

## 2015-03-28 NOTE — BHH Counselor (Signed)
Adult Comprehensive Assessment  Patient ID: Ramzey Petrovic, male   DOB: 1945/07/03, 69 y.o.   MRN: [id number]  Information Source: Information source: Patient  Current Stressors:  Educational / Learning stressors: PhD  Employment / Job issues: retired Family Relationships: estranged from adult Social research officer, government / Lack of resources (include bankruptcy): limited income from retirement pension, sometimes cannot afford medications as result Housing / Lack of housing: homeless, evicted from apartment in [place], lost home he owned in [place] for 25 years, has stayed in extended stay motels in interim Physical health (include injuries & life threatening diseases): cardiac and urinary tract issues, transferred to [facility] from cardiac unit at [facility], heart attack 6 months ago, stents placed Social relationships: was a "shut in" while in [employer], estranged from children, no social contacts/friends Substance abuse: social drinker in past, 30 year hx of 1 - 2 Xanax/day, withdrew on his own approx 18 months ago  Living/Environment/Situation:  Living Arrangements: Other (Comment) (car is at [employer], [employer] location) Living conditions (as described by patient or guardian): has no stable place to live, rented room at hotel after being turned away from son's home when he sought help there How long has patient lived in current situation?: evicted 6 months ago, no stable place since then What is atmosphere in current home: Temporary  Family History:  Divorced, when?: 10 years ago What types of issues is patient dealing with in the relationship?: no contact w either ex wife, paid settlement which took much of his money, lost house Additional relationship information: states both ex wives struggled w alcoholism Does patient have children?: Yes How many children?: 3 How is patient's relationship with their children?: Estranged from all due to children's perceptions that he has threatened them via email or texts.   Pt admits he has been "honest, frank" w children which has hurt feelings (Son Onalee Hua has current 50B against patient, patient was sleeping at [place] parking lot and police notified him of upcoming court date which was yesterday, per pt RN at [facility] called to inform court he was hospitalized; does not have lawyer, )  Childhood History:  By whom was/is the patient raised?: Both parents Additional childhood history information: Raised in [place], says he had a great chlidhood, "I guess had such a good childhood that I am now paying for it as a parent", says that he has had an extremely difficult adult life Description of patient's relationship with caregiver when they were a child: got along well w both parents, no substance use or other family issues, very close to both parents Patient's description of current relationship with people who raised him/her: both deceased Does patient have siblings?: No Did patient suffer any verbal/emotional/physical/sexual abuse as a child?: No Did patient suffer from severe childhood neglect?: No Has patient ever been sexually abused/assaulted/raped as an adolescent or adult?: No Was the patient ever a victim of a crime or a disaster?: No Witnessed domestic violence?: No Has patient been effected by domestic violence as an adult?: No  Education:  Highest grade of school patient has completed: PhD, 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 [employer], took early retirement at 61 due to depression Currently a Consulting civil engineer?: No Learning disability?: No  Employment/Work Situation:   Employment situation:  (Retired) Patient's job has been impacted by current illness: Yes Describe how patient's job has been impacted: now understands that depression impacted his job performance and may have led to "things falling apart" at the end  of his tenure at [employer] is the longest time patient has a held a job?: many years Where was the patient employed at that time?: [employer]  and [employer] Has patient ever been in the [employer]?: No Has patient ever served in combat?: No  Financial Resources:   Surveyor, quantity resources: Income from employment (receives retirement pension, little savings) Does patient have a Lawyer or guardian?: No  Alcohol/Substance Abuse:   What has been your use of drugs/alcohol within the last 12 months?: was a social drinker, "2 glasses of red wine/day" If attempted suicide, did drugs/alcohol play a role in this?: No Alcohol/Substance Abuse Treatment Hx: Past Tx, Inpatient If yes, describe treatment: Was sent to [facility] for 5 days to withdraw from 35 year history of 1 - 2 Xanax pills/day; was there approx 18 months ago under advice of MD treating his for possible heart attack - MD thought it was Xanax withdrawal instead Has alcohol/substance abuse ever caused legal problems?: No  Social Support System:   Forensic psychologist System: None Describe Community Support System: has burned bridges w children to extent that none of them will talk w him, eldest son has 50B in place against patient; made no friends during his 77 year tenure in [employer] at [employer] Type of faith/religion: recently becoming more interested in attending a church, not a fundamentalist "I am an academic" How does patient's faith help to cope with current illness?: has felt support from pastor of [employer] in [place] and Education officer, environmental in [place], "exploring God"  Leisure/Recreation:   Leisure and Hobbies: used to like to walk 3 miles on [address], likes to watch movies, was part of movie society at [employer]:   What things does the patient do well?: "that's not something that my culture likes to talk about", "I am an academic, 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, 2 of which are considered classics in my field", was ranked Pharmacist, 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 In what areas does patient struggle / problems for patient: relationships w  others, making impulsive statements, being too honest/direct w others  Discharge Plan:   Does patient have access to transportation?: Yes (currently has car that is parked at [employer]) Will patient be returning to same living situation after discharge?: No Plan for living situation after discharge: Pt was told he might qualify for ALF placement, wants some kind of senior housing, was evicted from former situation, has no resources Currently receiving community mental health services: No (saw [facility] in past, also got treatment from his PCP at [facility] on [address], now retired) If no, would patient like referral for services when discharged?: Yes (What county?) Medical sales [place]) Does patient have financial barriers related to discharge medications?: Yes Patient description of barriers related to discharge medications: has  Medicare but sometimes cannot afford his medications and goes without  Summary/Recommendations:    Patient is a 69 year old male, admitted to [facility] from Cardiac Unit at [facility] for treatment of major depression and anxiety.  Patient states that he has been thinking of suicide as result of lack of resources and social support.  Raised 3 children who are now adults - pt is estranged from all of them.  Has been irritable and harsh w them, by his account he has been frank/honest - this has resulted in children limiting their contact and one took out 50 B restraining order against patient.  Patient has current court date to answer charges of violating 50  B by contacting children via text and email.  Patient expresses sadness and some dismay that despite his "spending all my money" on support of family, he feels children have turned their backs on him.  Pt has been divorced twice, last time 10 years ago reportedly due to wife's inability to "deal w my depression."  Patient has cardiac issues, had heart attack 6 months ago and was treated in  [facility] for this.  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  Lost his home in [place] several years ago due to depression, moved to condo in [facility] and was a "shut in" there - did not go out much and did not establish social relationships.  Patient was evicted 6 months ago and has been staying in extended stay motels in various places since that time - did try to go to son's home but was turned away at the door.  Per patient, children have all stated that they want nothing to do w him.  CSW on cardiac unit was working on ALF placement, when pt discharged to [facility], these placement searches were withdrawn.  Patient states he wants some kind of senior housing or ALF at discharge, says his funds are insufficient to allow him to stay in motel long term and has no other source of social support on which to depend.  [place] car at airport, needs to notify hotel he is hospitalized.  Does not smoke, signed discharge process involvement form, wants referral to senior housing options.  Has no current medical or mental health providers as he has recently returned to [place].   Patient will benefit from hospitalization to receive psychoeducation and group therapy services to increase coping skills for and understanding of anxiety/anger, milieu therapy, medications management, and nursing support.  Patient will develop appropriate coping skills for dealing w overwhelming emotions, stabilize on medications, and develop greater insight into and acceptance of his current illness.  CSWs will develop discharge plan to include family support and referral to appropriate after care services.  Sallee Lange 03/28/2015

## 2015-03-28 NOTE — Progress Notes (Signed)
NUTRITION ASSESSMENT  Pt identified as at risk on the Malnutrition Screen Tool  INTERVENTION: 1. Educated patient on the importance of nutrition and encouraged intake of food and beverages. 2. Discussed weight goals. 3. Supplements: continue Ensure Enlive BID, each supplement provides 350 kcal and 20 grams of protein  NUTRITION DIAGNOSIS: Unintentional weight loss related to sub-optimal intake as evidenced by pt report.   Goal: Pt to meet >/= 90% of their estimated nutrition needs.  Monitor:  PO intake  Assessment:  Pt seen for MST. Pt admitted for SI with MDD. Per report, pt is homeless and does not have family support.   No recent weight hx available. Per chart review, pt has lost 52 lbs (22% body weight) in the past 2 years which is not significant for time frame; will monitor for further information concerning this.  Ensure Enlive has been ordered BID.  69 y.o. male  Height: Ht Readings from Last 1 Encounters:  03/27/15 5' 5.25" (1.657 m)    Weight: Wt Readings from Last 1 Encounters:  03/27/15 198 lb (89.812 kg)    Weight Hx: Wt Readings from Last 10 Encounters:  03/27/15 198 lb (89.812 kg)  03/26/15 188 lb (85.276 kg)  05/13/13 240 lb (108.863 kg)  09/09/11 235 lb 12.8 oz (106.958 kg)  08/11/11 229 lb 8 oz (104.101 kg)  07/21/11 228 lb (103.42 kg)  09/27/09 239 lb (108.41 kg)  09/04/08 243 lb (110.224 kg)  08/10/08 236 lb (107.049 kg)  12/28/07 225 lb (102.059 kg)    BMI:  Body mass index is 32.71 kg/(m^2). Pt meets criteria for obesity based on current BMI.  Estimated Nutritional Needs: Kcal: 25-30 kcal/kg Protein: > 1 gram protein/kg Fluid: 1 ml/kcal  Diet Order: Diet regular Room service appropriate?: Yes; Fluid consistency:: Thin Pt is also offered choice of unit snacks mid-morning and mid-afternoon.  Pt is eating as desired.   Lab results and medications reviewed.      Trenton Gammon, RD, LDN Inpatient Clinical Dietitian Pager #  [phone] After hours/weekend pager # [phone]

## 2015-03-28 NOTE — B[facility] Group Notes (Signed)
[facility] Group Notes:  (Nursing/MHT/Case Management/Adjunct)  Date:  03/28/2015  Time:  12:55 PM  Type of Therapy:  Nurse Education  Participation Level:  Active  Participation Quality:  Appropriate, Attentive, Sharing and Supportive  Affect:  Anxious  Cognitive:  Alert and Appropriate  Insight:  Improving  Engagement in Group:  Engaged and Supportive  Modes of Intervention:  Discussion and Education  Summary of Progress/Problems: Patient attended group and was engaged. Patient reports he is looking forward to speaking to social worker about his discharge plan.  Marzetta Board E 03/28/2015, 12:55 PM

## 2015-03-28 NOTE — Clinical Social Work Note (Signed)
[name] [phone]) [name] will come on 9/30 between 9:30 - 10 to assess patient for possible placement.  Santa Genera, LCSW Clinical Social Worker

## 2015-03-28 NOTE — Progress Notes (Signed)
Patient did attend the evening karaoke group. Pt was attentive and supportive but did not participate by singing a song. Pt did return to unit at 2110 to use the bathroom and take his medication and go to bed.

## 2015-03-28 NOTE — H&P (Signed)
Psychiatric Admission Assessment Adult  Patient Identification: [name] [name] MRN:  [id number] Date of Evaluation:  03/28/2015 Chief Complaint:  " Depression" Principal Diagnosis: Major Depression, Recurrent, Severe, no Psychotic Features  Diagnosis:   Patient Active Problem List   Diagnosis Date Noted  . MDD (major depressive disorder), recurrent episode, moderate [F33.1] 03/27/2015  . Major depressive disorder, recurrent episode, moderate [F33.1] 03/25/2015  . Midsternal chest pain [R07.2] 03/23/2015  . BPH (benign prostatic hypertrophy) [N40.0] 03/23/2015  . Homelessness [Z59.0] 03/23/2015  . Elevated troponin [R79.89] 03/23/2015  . Hypokalemia [E87.6] 03/22/2015  . Hydronephrosis [N13.30] 03/22/2015  . Urinary retention due to benign prostatic hyperplasia [N28.89, R33.8] 03/22/2015  . Unstable angina [I20.0] 03/19/2015  . Chest pain [R07.9] 03/19/2015  . Abnormal nuclear cardiac imaging test [R93.1] 03/19/2015  . OBESITY, CLASS III [E66.01] 09/04/2008  . EDEMA [R60.9] 08/10/2008  . DERMATOPHYTOSIS OF GROIN AND PERIANAL AREA [B35.6] 12/28/2007  . CAD S/P percutaneous coronary angioplasty - h/p PCI to RCA; Moderate LAD lesion with FFR 0.85 (med Rx) [I25.10, Z98.61] 10/06/2007  . Dyslipidemia [E78.5] 03/29/2007  . DEPRESSION/ANXIETY [F34.1] 03/29/2007  . Essential hypertension [I10] 03/29/2007  . GASTROESOPHAGEAL REFLUX DISEASE [K21.9] 03/29/2007  . ALLERGY [T78.40XA] 03/29/2007  . PROSTATITIS, HX OF [Z87.448] 03/29/2007  . 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 male , presented to ED and was admitted to [facility] due to  chest pain and angina.  During admission, he reported  feeling depressed, anxious,  and  Was having  Some suicidal ideations. He was seen by Psychiatry consultant , and  Inpatient psychiatric admission was recommended . Patient reports long history of depression and reports that due to  chronic differences with his adult children, he had decided to move away to [place] [place] [facility]. He was feeling more depressed recently and decided to return, but states his expectation that his son would " forgive the past " did not go as planned, leading to even worse depression.  He states he had stopped his psychiatric medications ( Cymbalta, Buspar, Remeron ) for several weeks prior to his admission. He denies having had side effects, and states he had stopped due to feeling depressed . Patient has a history of CAD and had an MI several months ago. As noted, his initial presentation to ED was related to angina . Associated Signs/Symptoms: Depression Symptoms: patient states that over the last two days he has been feeling " a little better ", as he feels more hopeful. He does report recent anhedonia, sadness, passive SI, sleep has been fair . (Hypo) Manic Symptoms:  Denies , and at this time does not present with manic symptoms Anxiety Symptoms:  Reports panic attacks, and has some degree of agoraphobia . States he worries excessively.  Psychotic Symptoms:  Denies hallucinations PTSD Symptoms: Denies  Total Time spent with patient: 45 minutes  Past Psychiatric History: Patient has a history of chronic depression for the last 25 years and was seen at least 3 psychiatrist in [place] and receiving antidepression medication.   Risk to Self: Is patient at risk for suicide?: Yes What has been your use of drugs/alcohol within the last 12 months?: was a social drinker, "2 glasses of red wine/day" Risk to Others:   Prior Inpatient Therapy:   Prior Outpatient Therapy:    Alcohol Screening: 1. How often do you have a drink containing alcohol?: Never 9. Have you or someone else been injured as a result  of your drinking?: No 10. Has a relative or friend or a doctor or another health worker been concerned about your drinking or suggested you cut down?: No Alcohol Use Disorder Identification Test Final Score  (AUDIT): 0 Brief Intervention: AUDIT score less than 7 or less-screening does not suggest unhealthy drinking-brief intervention not indicated (Pt reports that he has not had a drink in 8 montths, "have lost taste for it."  Before then, drank socially.) Substance Abuse History in the last 12 months:  Denies alcohol abuse- states he used to drink 2 glasses of wine several times a week, but has stopped and does not drink any more . Denies history of drug abuse . States he had been on Xanax in the past , and although he denies abusing it, does feel he became dependent on this medication, so he decided to taper off . He has been off Xanax x 9 months or so.  Consequences of Substance Abuse:  denies  Previous Psychotropic Medications:  Remembers having been on Cymbalta, Remeron, Buspar and feels these medications were working.  Psychological Evaluations: No  Past Medical History: as below- also reports having urinary retention, and had a folley catheter at [facility] .   Past Medical History  Diagnosis Date  . Edema   . Dermatophytosis of groin and perianal area   . CAD (coronary artery disease)     cath '06, repeat '08 non-obstructive disease  . GERD (gastroesophageal reflux disease)   . Hyperlipidemia   . Allergy   . History of prostatitis   . History of BPH   . Hypertension   . Anxiety   . Depression   . Myocardial infarction ~ 08/2014    Past Surgical History  Procedure Laterality Date  . Tonsillectomy and adenoidectomy    . Coronary angioplasty with stent placement  ~ 08/2014    "1 stent"  . Cardiac catheterization  05/07/2005   . Cardiac catheterization N/A 03/20/2015    Procedure: Left Heart Cath and Coronary Angiography;  Surgeon: Lyn Records, MD;  Location: [facility] INVASIVE CV LAB;  Service: Cardiovascular;  Laterality: N/A;   Family History:  Parents deceased, died of heart related illnesses ,  Patient is an only child . Family History  Problem Relation Age of Onset  . Stroke Mother   .  Coronary artery disease Father   . Heart attack Father   . Cancer Neg Hx     colon or prostate cancer   Family Psychiatric  History:  States father had history of depression, denies history of suicides in family, one of his sons has history of alcohol dependence .  Social History: Patient  Is originally from [place],  but has lived in [place] x [place] + years.  He is divorced .  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  Recently had  come back to [place], and over the last 2 weeks had been  staying in a local motel .  States he had a recent court date due to son getting a 69 B against him, but states he could not go , because he was already in hospital.  Patient reports he is currently homeless .  History  Alcohol Use  . Yes    Comment: ocassionally     History  Drug Use No    Social History   Social History  . Marital Status: Divorced    Spouse Name: N/A  . Number of Children: N/A  . Years of Education: N/A   Social History Main Topics  . Smoking status: Former Smoker    Types: Cigars  . Smokeless tobacco: Never Used     Comment: quit smoking in 2010  . Alcohol Use: Yes     Comment: ocassionally  . Drug Use: No  . Sexual Activity:    Partners: Female   Other Topics Concern  . 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 Aspirin     Patient states can take low dose aspirin    Lab Results: No results found for this or any previous visit (from the past 48 hour(s)).  Metabolic Disorder Labs:  Lab Results   Component Value Date   HGBA1C 5.5 03/19/2015   MPG 111 03/19/2015   No results found for: PROLACTIN Lab Results  Component Value Date   CHOL 204* 03/20/2015   TRIG 155* 03/20/2015   HDL 29* 03/20/2015   CHOLHDL 7.0 03/20/2015   VLDL 31 03/20/2015   LDLCALC 144* 03/20/2015   LDLCALC 53 09/26/2009    Current Medications: Current Facility-Administered Medications  Medication Dose Route Frequency Provider Last Rate Last Dose  . acetaminophen (TYLENOL) tablet 650 mg  650 mg Oral Q6H PRN Kerry Hough, PA-C      . ALPRAZolam (XANAX XR) 24 hr tablet 2 mg  2 mg Oral BH-q7a Kerry Hough, PA-C   Stopped at 03/28/15 0700  . ALPRAZolam Prudy Feeler) tablet 0.5 mg  0.5 mg Oral QHS PRN Kerry Hough, PA-C      . alum & mag hydroxide-simeth (MAALOX/MYLANTA) 200-200-20 MG/5ML suspension 30 mL  30 mL Oral Q4H PRN Kerry Hough, PA-C      . aspirin EC tablet 81 mg  81 mg Oral Daily Kerry Hough, PA-C   81 mg at 03/28/15 0846  . atorvastatin (LIPITOR) tablet 40 mg  40 mg Oral Daily Kerry Hough, PA-C   40 mg at 03/28/15 0846  . clopidogrel (PLAVIX) tablet 75 mg  75 mg Oral Daily Kerry Hough, PA-C   75 mg at 03/28/15 0846  . DULoxetine (CYMBALTA) DR capsule 60 mg  60 mg Oral Daily Kerry Hough, PA-C   60 mg at 03/28/15 0846  . feeding supplement (ENSURE ENLIVE) (ENSURE ENLIVE) liquid 237 mL  237 mL Oral BID BM Rockey Situ Cobos, MD   237 mL at 03/28/15 0955  . finasteride (PROSCAR) tablet 5 mg  5 mg Oral Daily Kerry Hough, PA-C   5 mg at 03/28/15 0846  . isosorbide mononitrate (IMDUR) 24 hr tablet 30 mg  30 mg Oral Daily Kerry Hough, PA-C   30 mg at 03/28/15 0846  . magnesium hydroxide (MILK OF MAGNESIA) suspension 30 mL  30 mL Oral Daily PRN Kerry Hough, PA-C      . metoprolol (LOPRESSOR) tablet 100 mg  100 mg Oral BID Kerry Hough, PA-C   100 mg at 03/28/15 0846  . nitroGLYCERIN (NITROSTAT) SL tablet 0.4 mg  0.4  mg Sublingual Q5 min PRN Kerry Hough, PA-C      .  pantoprazole (PROTONIX) EC tablet 40 mg  40 mg Oral Q1200 Kerry Hough, PA-C   40 mg at 03/28/15 1213  . potassium chloride SA (K-DUR,KLOR-CON) CR tablet 20 mEq  20 mEq Oral Daily Kerry Hough, PA-C   20 mEq at 03/28/15 0845  . tamsulosin (FLOMAX) capsule 0.4 mg  0.4 mg Oral Daily Kerry Hough, PA-C   0.4 mg at 03/28/15 0846  . traZODone (DESYREL) tablet 50 mg  50 mg Oral QHS,MR X 1 Spencer E Simon, PA-C   50 mg at 03/27/15 2314   PTA Medications: Prescriptions prior to admission  Medication Sig Dispense Refill Last Dose  . ALPRAZolam (ALPRAZOLAM XR) 2 MG 24 hr tablet Take 1 tablet (2 mg total) by mouth every morning. 30 tablet 0   . ALPRAZolam (XANAX) 0.5 MG tablet Take 1 tablet (0.5 mg total) by mouth at bedtime as needed for sleep. 30 tablet 0   . aspirin EC 81 MG EC tablet Take 1 tablet (81 mg total) by mouth daily.     Marland Kitchen atorvastatin (LIPITOR) 40 MG tablet Take 40 mg by mouth daily.   03/18/2015 at Unknown time  . clopidogrel (PLAVIX) 75 MG tablet Take 1 tablet (75 mg total) by mouth daily. 30 tablet 11   . DULoxetine (CYMBALTA) 60 MG capsule TAKE 1 CAPSULE (60 MG TOTAL) BY MOUTH DAILY. 30 capsule 0   . finasteride (PROSCAR) 5 MG tablet Take 1 tablet (5 mg total) by mouth daily. 30 tablet 0 03/18/2015 at Unknown time  . isosorbide mononitrate (IMDUR) 30 MG 24 hr tablet Take 1 tablet (30 mg total) by mouth daily. 30 tablet 11   . LEVITRA 20 MG tablet TAKE 1 TABLET (20 MG TOTAL) BY MOUTH DAILY AS NEEDED. 10 tablet 0 unknown  . metoprolol (LOPRESSOR) 100 MG tablet Take 1 tablet (100 mg total) by mouth 2 (two) times daily. 60 tablet 5   . nitroGLYCERIN (NITROSTAT) 0.4 MG SL tablet Place 1 tablet (0.4 mg total) under the tongue every 5 (five) minutes as needed for chest pain. 25 tablet 3   . pantoprazole (PROTONIX) 40 MG tablet Take 1 tablet (40 mg total) by mouth daily at 12 noon. 30 tablet 11   . potassium chloride SA (K-DUR,KLOR-CON) 20 MEQ tablet Take 1 tablet (20 mEq total) by mouth  daily. 30 tablet 3   . tamsulosin (FLOMAX) 0.4 MG CAPS capsule Take 1 capsule (0.4 mg total) by mouth daily. 30 capsule 0 03/18/2015 at Unknown time    Musculoskeletal: Strength & Muscle Tone: within normal limits Gait & Station: normal Patient leans: N/A  Psychiatric Specialty Exam: Physical Exam  Review of Systems  Constitutional: Negative.   HENT: Negative.   Respiratory: Negative.   Cardiovascular: Negative for chest pain.  Gastrointestinal: Negative for nausea, vomiting, abdominal pain and blood in stool.  Genitourinary: Positive for hematuria. Negative for dysuria and urgency.       Some urinary retention, but now voiding spontaneously Some hematuria related to recent catheterization, now improved and denies current hematuria  Skin: Negative.   Neurological: Negative for seizures.  Endo/Heme/Allergies: Negative.   Psychiatric/Behavioral: Positive for depression and suicidal ideas.  All other systems reviewed and are negative.   Blood pressure 101/57, pulse 83, temperature 97.3 F (36.3 C), temperature source Oral, resp. rate 18, height 5' 5.25" (1.657 m), weight 198 lb (89.812 kg).Body mass index is 32.71 kg/(m^2).  General Appearance: Fairly Groomed  Patent attorney::  Good  Speech:  Normal Rate  Volume:  Normal  Mood:  Anxious and Depressed  Affect:  Congruent  Thought Process:  Linear  Orientation:  Other:  fully alert and attentive   Thought Content:  ruminative about stressors, no hallucinations , no delusions  Suicidal Thoughts:  at this time denies any suicidal ideations, and contracts for safety at present  Homicidal Thoughts:  No  Memory:  recent and remote grossly intact   Judgement:  Fair  Insight:  Present  Psychomotor Activity:  Normal  Concentration:  Good  Recall:  Good  Fund of Knowledge:Good  Language: Good  Akathisia:  No  Handed:  Right  AIMS (if indicated):     Assets:  Communication Skills Desire for Improvement Resilience  ADL's:  Intact   Cognition: alert and attentive   Sleep:  Number of Hours: 7     Treatment Plan Summary: Daily contact with patient to assess and evaluate symptoms and progress in treatment, Medication management, Plan inpatient admission and medications as below   Observation Level/Precautions:  15 minute checks  Laboratory:   Will order UA/  and will order repeat CBC to follow up on anemia   Psychotherapy:  Milieu  , groups   Medications:   Patient states he had not been taking Xanax recently and wants to taper off this medication- will cut Xanax to 1 mgrs QDAY, and taper gradually as tolerated Continue Cymbalta 60 mgrs QDAY for depression and anxiety Start Buspar 5 mgrs BID to address anxiety, which he states was helpful and well tolerated in the past  On Plavix , ASA, Nitro PRN to address history of CAD On FLomax to address history of urinary retention Decrease Trazodone to 25 mgrs QHS PRN to minimize side effects  Consultations:  As needed   Discharge Concerns:  Patient interested in going to a residential /assisted living setting after discharge   Estimated LOS:  Other:     I certify that inpatient services furnished can reasonably be expected to improve the patient's condition.   COBOS, FERNANDO 9/29/20162:13 PM

## 2015-03-28 NOTE — Progress Notes (Signed)
D: Pt is alert and oriented x 3. Pt complained of severe depression from several stressors. He states, "I am homeless, my children do not want thing to do with me, I have several health issues and I need to be my psych medicine right." Pt also complained of moderate anxiety just for been here. Pt however, denies SI/HI and AVH. Pt continue to be pleasant and cooperative   A: Medications offered as prescribed.  Pt attended wrap-up group. Support, encouragement, and safe environment provided.  15-minute safety checks continue.  R: Pt was med compliant.  Pt attended group. Safety checks continue.

## 2015-03-28 NOTE — [facility] Suicide Risk Assessment (Signed)
[facility] INPATIENT:  Family/Significant Other Suicide Prevention Education  Suicide Prevention Education:  Patient Refusal for Family/Significant Other Suicide Prevention Education: The patient [name] [name] has refused to provide written consent for family/significant other to be provided Family/Significant Other Suicide Prevention Education during admission and/or prior to discharge.  Physician notified.  Patient cannot provide contact information for family members, family members have filed 50B restraining order against patient prohibiting contact w him.  Sallee Lange 03/28/2015, 3:33 PM

## 2015-03-28 NOTE — [facility] Group Notes (Signed)
[facility] Mental Health Association Group Therapy 03/28/2015 1:15pm  Type of Therapy: Mental Health Association Presentation  Participation Level: Active  Participation Quality: Attentive  Affect: Appropriate  Cognitive: Oriented  Insight: Developing/Improving  Engagement in Therapy: Engaged  Modes of Intervention: Discussion, Education and Socialization  Summary of Progress/Problems: Mental Health Association (MHA) Speaker came to talk about his personal journey with substance abuse and addiction. The pt processed ways by which to relate to the speaker. MHA speaker provided handouts and educational information pertaining to groups and services offered by the [facility]. Pt was engaged in speaker's presentation and was receptive to resources provided.    Chad Cordial, LCSWA 03/28/2015 2:18 PM

## 2015-03-28 NOTE — Progress Notes (Signed)
Patient ID: [name] [name], male   DOB: 1945-12-08, 69 y.o.   MRN: [id number]  DAR: Pt. Denies SI/HI and A/V Hallucinations. He reports that his sleep was poor last night, appetite is fair, energy level is low, and concentration is poor. Patient rates his depression 4/10, hopelessness is 4/10, and anxiety 6/10. Patient had a scheduled Tb injection which at this time he has refused due to reportedly receiving it in the hospital. However writer can not find that documentation in the chart. Patient thus refused. Writer encouraged patient to speak with MD tomorrow about this.  Patient does not report any pain or discomfort at this time. Support and encouragement provided to the patient. Scheduled medications administered to patient per physician's orders. Patient is receptive and cooperative. He is seen in the milieu at times and is attending some groups. Q15 minute checks are maintained for safety.

## 2015-03-29 ENCOUNTER — Telehealth: Payer: Self-pay | Admitting: Cardiology

## 2015-03-29 LAB — CBC WITH DIFFERENTIAL/PLATELET
Basophils Absolute: 0 10*3/uL (ref 0.0–0.1)
Basophils Relative: 0 %
EOS ABS: 0.2 10*3/uL (ref 0.0–0.7)
Eosinophils Relative: 2 %
HEMATOCRIT: 29.7 % — AB (ref 39.0–52.0)
HEMOGLOBIN: 9.5 g/dL — AB (ref 13.0–17.0)
LYMPHS PCT: 17 %
Lymphs Abs: 1.2 10*3/uL (ref 0.7–4.0)
MCH: 29.2 pg (ref 26.0–34.0)
MCHC: 32 g/dL (ref 30.0–36.0)
MCV: 91.4 fL (ref 78.0–100.0)
Monocytes Absolute: 0.5 10*3/uL (ref 0.1–1.0)
Monocytes Relative: 7 %
NEUTROS PCT: 74 %
Neutro Abs: 5.5 10*3/uL (ref 1.7–7.7)
Platelets: 300 10*3/uL (ref 150–400)
RBC: 3.25 MIL/uL — AB (ref 4.22–5.81)
RDW: 14.1 % (ref 11.5–15.5)
WBC: 7.5 10*3/uL (ref 4.0–10.5)

## 2015-03-29 MED ORDER — ALPRAZOLAM ER 0.5 MG PO TB24
0.5000 mg | ORAL_TABLET | ORAL | Status: DC
  Administered 2015-03-30 – 2015-03-31 (×2): 0.5 mg via ORAL
  Filled 2015-03-29 (×2): qty 1

## 2015-03-29 NOTE — Plan of Care (Signed)
Problem: Diagnosis: Increased Risk For Suicide Attempt Goal: STG-Patient Will Comply With Medication Regime Outcome: Progressing Pt was compliant with scheduled medication tonight.      

## 2015-03-29 NOTE — Progress Notes (Signed)
D: Pt has depressed affect and mood.  He reports "I feel a little better now, I've felt better since I found out I was coming here."  Pt reports he is hoping to go to an assisted living facility after he leaves [facility].  Pt was incontinent of urine and his sheets were changed, pt provided with diaper, pads placed on bed.  Pt denies SI/HI, denies hallucinations, denies pain.  Pt has been visible in milieu interacting with peers and staff appropriately.  Pt attended evening group.   A: Introduced self to pt.  Met with pt 1:1 and provided support and encouragement.  Actively listened to pt.  Medications administered per order.  R: Pt is compliant with medications.  Pt verbally contracts for safety.  Will continue to monitor and assess.

## 2015-03-29 NOTE — Progress Notes (Signed)
Patient ID: [name] [name], male   DOB: [birth year], 69 y.o.   MRN: [id number]  PPD placed on rt inner arm about 2 inches above some bruising from heart catherization. Patient tolerated well and information is in the paper chart.

## 2015-03-29 NOTE — [facility] Group Notes (Signed)
[facility] LCSW Aftercare Discharge Planning Group Note  03/29/2015 8:45 AM  Pt did not attend, declined invitation.   Chad Cordial, LCSWA 03/29/2015 10:12 AM

## 2015-03-29 NOTE — [facility] Group Notes (Signed)
[facility] LCSW Group Therapy 03/29/2015 1:15pm  Type of Therapy: Group Therapy- Feelings Around Relapse and Recovery  Participation Level: Active   Participation Quality:  Appropriate  Affect:  Appropriate  Cognitive: Alert and Oriented   Insight:  Developing   Engagement in Therapy: Developing/Improving and Engaged   Modes of Intervention: Clarification, Confrontation, Discussion, Education, Exploration, Limit-setting, Orientation, Problem-solving, Rapport Building, Dance movement psychotherapist, Socialization and Support  Summary of Progress/Problems: The topic for today was feelings about relapse. The group discussed what relapse prevention is to them and identified triggers that they are on the path to relapse. Members also processed their feeling towards relapse and were able to relate to common experiences. Group also discussed coping skills that can be used for relapse prevention.  Pt discussed at the end of group how he now has a broader understanding of relapse and how it applies to multiple issues and not just drug and alcohol use. He commented on the usefulness of the concept of boundaries as it helps keep healthy relationships and live a more balanced life.    Therapeutic Modalities:   Cognitive Behavioral Therapy Solution-Focused Therapy Assertiveness Training Relapse Prevention Therapy    Lamar Sprinkles [phone] 03/29/2015 5:21 PM

## 2015-03-29 NOTE — Clinical Social Work Note (Signed)
Rep from [facility] [employer] will come on Monday to assist patient w Medicaid application.  Wyvonnia Lora [phone]) from [facility] had to reschedule interview - says he will try to come meet w patient on Saturday approx early afternoon or will try to come Sunday morning.    Santa Genera, LCSW Clinical Social Worker

## 2015-03-29 NOTE — Progress Notes (Signed)
Patient ID: [name] [name], male   DOB: Sep 10, 1945, 69 y.o.   MRN: [id number]  D: Patient pleasant on approach today. Reports that his incontinence here has been embarrassing but says he has been having urinary issues for 3 weeks now. Reports he still feels depressed and asked what "psych medications" he is getting today. He reports that he still has some SI but it comes and goes and contracts foe safety here. Went over fall precautions with him due to the incontinence and being on a lot of medications.  A: Staff will monitor on q 15 minute checks, follow treatment plan, and give meds as ordered R: Cooperative on the unit.

## 2015-03-29 NOTE — Progress Notes (Signed)
Adult Psychoeducational Group Note  Date:  03/29/2015 Time:  10:05 PM  Group Topic/Focus:  Wrap-Up Group:   The focus of this group is to help patients review their daily goal of treatment and discuss progress on daily workbooks.  Participation Level:  Active  Participation Quality:  Appropriate and Attentive  Affect:  Appropriate  Cognitive:  Appropriate  Insight: Appropriate and Good  Engagement in Group:  Engaged  Modes of Intervention:  Education  Additional Comments:  Pt relapse prevention goal is to try new medication.   Merlinda Frederick 03/29/2015, 10:05 PM

## 2015-03-29 NOTE — Telephone Encounter (Signed)
D/C phone call .Marland Kitchen Appt is on 04/16/15 at 2pm w/ Theodore Demark at [facility] office.  Thanks

## 2015-03-29 NOTE — Progress Notes (Signed)
[facility] MD Progress Note  03/29/2015 1:25 PM [name] [name]  MRN:  [id number] Subjective:   Patient reports ongoing depression and anxiety but has been feeling better overall, and states that he feels more supported  And therefore a little more optimistic.  Denies suicidal ideations at present . In regards to urinary symptoms, he has been able to void spontaneously and states urinary caliber has improved partially. He still has some leakage particularly at night time, and feels self conscious, somewhat  Embarrassed about this . He does feel that Flomax has helped . Objective : I have discussed case with treatment team and have met with patient. Patient remains depressed, anxious, but future oriented, and focused mostly on disposition plans- wants to go to an independent or possibly an assisted living setting after discharge, if possible. He denies medication side effects. He remains depressed, anxious, but reports some partial improvement compared to admission. No psychotic symptoms. No disruptive behavior on unit. UA - not indicative of UTI, and does not endorse dysuria, urgency, or fever/chills  Has history of CAD - denies any Chest Pain or Shortness of Breath .  Principal Problem: MDD (major depressive disorder), recurrent episode, moderate Diagnosis:   Patient Active Problem List   Diagnosis Date Noted  . MDD (major depressive disorder), recurrent episode, moderate [F33.1] 03/27/2015  . Major depressive disorder, recurrent episode, moderate [F33.1] 03/25/2015  . Midsternal chest pain [R07.2] 03/23/2015  . BPH (benign prostatic hypertrophy) [N40.0] 03/23/2015  . Homelessness [Z59.0] 03/23/2015  . Elevated troponin [R79.89] 03/23/2015  . Hypokalemia [E87.6] 03/22/2015  . Hydronephrosis [N13.30] 03/22/2015  . Urinary retention due to benign prostatic hyperplasia [N28.89, R33.8] 03/22/2015  . Unstable angina [I20.0] 03/19/2015  . Chest pain [R07.9] 03/19/2015  . Abnormal nuclear cardiac imaging  test [R93.1] 03/19/2015  . OBESITY, CLASS III [E66.01] 09/04/2008  . EDEMA [R60.9] 08/10/2008  . DERMATOPHYTOSIS OF GROIN AND PERIANAL AREA [B35.6] 12/28/2007  . CAD S/P percutaneous coronary angioplasty - h/p PCI to RCA; Moderate LAD lesion with FFR 0.85 (med Rx) [I25.10, Z98.61] 10/06/2007  . Dyslipidemia [E78.5] 03/29/2007  . DEPRESSION/ANXIETY [F34.1] 03/29/2007  . Essential hypertension [I10] 03/29/2007  . GASTROESOPHAGEAL REFLUX DISEASE [K21.9] 03/29/2007  . ALLERGY [T78.40XA] 03/29/2007  . PROSTATITIS, HX OF [Z87.448] 03/29/2007  . 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 Edema   . Dermatophytosis of groin and perianal area   . CAD (coronary artery disease)     cath '06, repeat '08 non-obstructive disease  . GERD (gastroesophageal reflux disease)   . Hyperlipidemia   . Allergy   . History of prostatitis   . History of BPH   . Hypertension   . Anxiety   . Depression   . Myocardial infarction ~ 08/2014    Past Surgical History  Procedure Laterality Date  . Tonsillectomy and adenoidectomy    . Coronary angioplasty with stent placement  ~ 08/2014    "1 stent"  . Cardiac catheterization  05/07/2005   . Cardiac catheterization N/A 03/20/2015    Procedure: Left Heart Cath and Coronary Angiography;  Surgeon: Belva Crome, MD;  Location: [facility];  Service: Cardiovascular;  Laterality: N/A;   Family History:  Family History  Problem Relation Age of Onset  . Stroke Mother   . Coronary artery disease Father   . Heart attack Father   . Cancer Neg  Hx     colon or prostate cancer   Social History:  History  Alcohol Use  . Yes    Comment: ocassionally     History  Drug Use No    Social History   Social History  . Marital Status: Divorced    Spouse Name: N/A  . Number of Children: N/A  . Years of Education: N/A   Social History  Main Topics  . Smoking status: Former Smoker    Types: Cigars  . Smokeless tobacco: Never Used     Comment: quit smoking in 2010  . Alcohol Use: Yes     Comment: ocassionally  . Drug Use: No  . Sexual Activity:    Partners: Female   Other Topics Concern  . 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 acetaminophen (TYLENOL) tablet 650 mg  650 mg Oral Q6H PRN SLaverle Hobby PA-C      . ALPRAZolam (XANAX XR) 24 hr tablet 1 mg  1 mg Oral BH-q7a FJenne Campus MD   1 mg at 03/29/15 0836  . alum & mag hydroxide-simeth (MAALOX/MYLANTA) 200-200-20 MG/5ML suspension 30 mL  30 mL Oral Q4H PRN SLaverle Hobby PA-C      . aspirin EC tablet 81 mg  81 mg Oral Daily SLaverle Hobby PA-C   81 mg at 03/29/15 09528 . atorvastatin (LIPITOR) tablet 40 mg  40 mg Oral Daily SLaverle Hobby PA-C   40 mg at 03/29/15 04132 . busPIRone (BUSPAR) tablet 5 mg  5 mg Oral BID FJenne Campus MD   5 mg at 03/29/15 04401 . clopidogrel (PLAVIX) tablet 75 mg  75 mg Oral Daily SLaverle Hobby PA-C   75 mg at 03/29/15 00272 . DULoxetine (CYMBALTA) DR capsule 60 mg  60 mg Oral Daily SLaverle Hobby PA-C   60 mg at 03/29/15 05366 . feeding supplement (ENSURE ENLIVE) (ENSURE ENLIVE) liquid 237 mL  237 mL Oral BID BM FMyer PeerCobos, MD   237 mL at 03/28/15 1540  . finasteride (PROSCAR) tablet 5 mg  5 mg Oral Daily SLaverle Hobby PA-C   5 mg at 03/29/15 04403 . isosorbide mononitrate (IMDUR) 24 hr tablet 30 mg  30 mg Oral Daily SLaverle Hobby PA-C   30 mg at 03/29/15 04742 . magnesium hydroxide (MILK OF MAGNESIA) suspension 30 mL  30 mL Oral Daily PRN SLaverle Hobby PA-C       . metoprolol (LOPRESSOR) tablet 100 mg  100 mg Oral BID SLaverle Hobby PA-C   100 mg at 03/29/15 05956 . nitroGLYCERIN (NITROSTAT) SL tablet 0.4 mg  0.4 mg Sublingual Q5 min PRN SLaverle Hobby PA-C      . pantoprazole (PROTONIX) EC tablet 40 mg  40 mg Oral Q1200 SLaverle Hobby PA-C   40 mg at 03/29/15 1152  . potassium chloride SA (K-DUR,KLOR-CON) CR tablet 20 mEq  20 mEq Oral Daily SLaverle Hobby PA-C   20 mEq at 03/29/15 03875 . tamsulosin (FLOMAX) capsule 0.4 mg  0.4 mg Oral Daily SLaverle Hobby PA-C   0.4 mg at 03/29/15 06433 .  traZODone (DESYREL) tablet 25 mg  25 mg Oral QHS,MR X 1 Jenne Campus, MD   25 mg at 03/28/15 2105  . tuberculin injection 5 Units  5 Units Intradermal Once Benjamine Mola, FNP   5 Units at 03/28/15 5449    Lab Results:  Results for orders placed or performed during the hospital encounter of 03/27/15 (from the past 48 hour(s))  Urinalysis with microscopic     Status: Abnormal   Collection Time: 03/28/15  7:51 PM  Result Value Ref Range   Color, Urine YELLOW YELLOW   APPearance CLOUDY (A) CLEAR   Specific Gravity, Urine 1.012 1.005 - 1.030   pH 5.5 5.0 - 8.0   Glucose, UA NEGATIVE NEGATIVE mg/dL   Hgb urine dipstick TRACE (A) NEGATIVE   Bilirubin Urine NEGATIVE NEGATIVE   Ketones, ur NEGATIVE NEGATIVE mg/dL   Protein, ur 100 (A) NEGATIVE mg/dL   Urobilinogen, UA 0.2 0.0 - 1.0 mg/dL   Nitrite NEGATIVE NEGATIVE   Leukocytes, UA NEGATIVE NEGATIVE   WBC, UA 0-2 <3 WBC/hpf   RBC / HPF 3-6 <3 RBC/hpf    Comment: Performed at [facility]    Physical Findings: AIMS:  , ,  ,  ,    CIWA:    COWS:     Musculoskeletal: Strength & Muscle Tone: within normal limits Gait & Station: normal Patient leans: N/A  Psychiatric Specialty Exam: ROS no fever, no chills , denies hematuria at this time. No nausea or vomiting  Blood pressure 104/65, pulse 101, temperature 97.8 F (36.6 C), temperature source Oral, resp. rate 16, height 5'  5.25" (1.657 m), weight 198 lb (89.812 kg).Body mass index is 32.71 kg/(m^2).  General Appearance: Fairly Groomed  Engineer, water::  Good  Speech:  Normal Rate  Volume:  Normal  Mood:  depressed and anxious but has improved compared to admission   Affect:  Congruent  Thought Process:  Linear  Orientation:  Other:  fully alert and attentive   Thought Content:  no hallucinations, no delusions, not internally preoccupied   Suicidal Thoughts:  No- denies any current SI or any current self injurious ideations  Homicidal Thoughts:  No  Memory:  recent and remote grossly intact   Judgement:  Fair  Insight:  Fair  Psychomotor Activity:  Normal  Concentration:  Good  Recall:  Good  Fund of Knowledge:Good  Language: Good  Akathisia:  Negative  Handed:  Right  AIMS (if indicated):     Assets:  Desire for Improvement Resilience  ADL's:  Fair   Cognition: WNL  Sleep:  Number of Hours: 6.5   Treatment Plan Summary: Daily contact with patient to assess and evaluate symptoms and progress in treatment, Medication management, Plan inpatient treatment and medications as below  Continue Cymbalta 60 mgrs QDAY for depression and anxiety Continue Buspar at 5 mgrs TID for anxiety Continue Flomax for urinary symptoms/ BPH Continue Trazodone 25 mgrs QHS PRN insomnia Decrease Xanax to 0.5 mgrs QDAY - for anxiety- as discussed with patient the goal is to taper off BZD as tolerated .  Continue Plavix, Nitroglycerin PRNs to address history of CAD Continue Protonix for GERD  Continue to encourage  Milieu/ group  participation  To work on coping skills  And reduction of symptoms. CSW/Treatment team working on disposition options.      COBOS, FERNANDO 03/29/2015, 1:25 PM

## 2015-03-30 NOTE — Progress Notes (Addendum)
D: Patient denies SI/HI/AVH. Patient affect is appropriate and his mood is depressed.  Patient did attend the majority of groups this morning.  Patient remained in his room the remainder of the shift. No distress noted. A: Support and encouragement offered. Scheduled medications given to pt.  Patient chose to drink his Ensure after dinner.  Q 15 min checks continued for patient safety. R: Patient receptive. Patient remains safe on the unit.

## 2015-03-30 NOTE — Progress Notes (Signed)
Patient ID: [name] [name], male   DOB: Mar 26, 1946, 69 y.o.   MRN: [id number] [facility] MD Progress Note  03/30/2015 2:14 PM [name] [name]  MRN:  [id number] Subjective:   Patient reports  Slight improvement of mood, and less severe depression. He is hopeful an upcoming interview for  ALF will go well.  States he has been able to void spontaneously, and that although still incontinent, he feels these symptoms are improving, which he attributes to Flomax " working for me ". Of note, denies hematuria, denies dysuria , denies fever, denies chills . He continues to struggle with some sense of embarrassment , self consciousness regarding using adult diaper/ pads. Responds well to support, empathy.  Denies medication side effects. Objective : I have  Reviewed chart notes and have met with patient. Although still depressed and  Anxious he is partially improved compared to his admission , and is future oriented, wanting to go to an ALF type setting after discharge . He is not suicidal , and is less hopeless at this time. Tends to isolate in room, but has been going to some groups. No disruptive or agitated behaviors on unit.  .  Principal Problem: MDD (major depressive disorder), recurrent episode, moderate ([place]) Diagnosis:   Patient Active Problem List   Diagnosis Date Noted  . MDD (major depressive disorder), recurrent episode, moderate [F33.1] 03/27/2015  . Major depressive disorder, recurrent episode, moderate [F33.1] 03/25/2015  . Midsternal chest pain [R07.2] 03/23/2015  . BPH (benign prostatic hypertrophy) [N40.0] 03/23/2015  . Homelessness [Z59.0] 03/23/2015  . Elevated troponin [R79.89] 03/23/2015  . Hypokalemia [E87.6] 03/22/2015  . Hydronephrosis [N13.30] 03/22/2015  . Urinary retention due to benign prostatic hyperplasia [N28.89, R33.8] 03/22/2015  . Unstable angina [I20.0] 03/19/2015  . Chest pain [R07.9] 03/19/2015  . Abnormal nuclear cardiac imaging test [R93.1] 03/19/2015  . OBESITY, CLASS III  [E66.01] 09/04/2008  . EDEMA [R60.9] 08/10/2008  . DERMATOPHYTOSIS OF GROIN AND PERIANAL AREA [B35.6] 12/28/2007  . CAD S/P percutaneous coronary angioplasty - h/p PCI to RCA; Moderate LAD lesion with FFR 0.85 (med Rx) [I25.10, Z98.61] 10/06/2007  . Dyslipidemia [E78.5] 03/29/2007  . DEPRESSION/ANXIETY [F34.1] 03/29/2007  . Essential hypertension [I10] 03/29/2007  . GASTROESOPHAGEAL REFLUX DISEASE [K21.9] 03/29/2007  . ALLERGY [T78.40XA] 03/29/2007  . PROSTATITIS, HX OF [Z87.448] 03/29/2007  . 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 Edema   . Dermatophytosis of groin and perianal area   . CAD (coronary artery disease)     cath '06, repeat '08 non-obstructive disease  . GERD (gastroesophageal reflux disease)   . Hyperlipidemia   . Allergy   . History of prostatitis   . History of BPH   . Hypertension   . Anxiety   . Depression   . Myocardial infarction ~ 08/2014    Past Surgical History  Procedure Laterality Date  . Tonsillectomy and adenoidectomy    . Coronary angioplasty with stent placement  ~ 08/2014    "1 stent"  . Cardiac catheterization  05/07/2005   . Cardiac catheterization N/A 03/20/2015    Procedure: Left Heart Cath and Coronary Angiography;  Surgeon: Belva Crome, MD;  Location: [facility];  Service: Cardiovascular;  Laterality: N/A;   Family History:  Family History  Problem Relation Age of Onset  . Stroke Mother   . Coronary artery disease Father   .  Heart attack Father   . Cancer Neg Hx     colon or prostate cancer   Social History:  History  Alcohol Use  . Yes    Comment: ocassionally     History  Drug Use No    Social History   Social History  . Marital Status: Divorced    Spouse Name: N/A  . Number of Children: N/A  . Years of Education: N/A   Social History Main Topics  . Smoking status: Former Smoker     Types: Cigars  . Smokeless tobacco: Never Used     Comment: quit smoking in 2010  . Alcohol Use: Yes     Comment: ocassionally  . Drug Use: No  . Sexual Activity:    Partners: Female   Other Topics Concern  . 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 acetaminophen (TYLENOL) tablet 650 mg  650 mg Oral Q6H PRN Laverle Hobby, PA-C      . ALPRAZolam (XANAX XR) 24 hr tablet 0.5 mg  0.5 mg Oral BH-q7a Myer Peer Cobos, MD   0.5 mg at 03/30/15 3810  . alum & mag hydroxide-simeth (MAALOX/MYLANTA) 200-200-20 MG/5ML suspension 30 mL  30 mL Oral Q4H PRN Laverle Hobby, PA-C      . aspirin EC tablet 81 mg  81 mg Oral Daily Laverle Hobby, PA-C   81 mg at 03/30/15 1751  . atorvastatin (LIPITOR) tablet 40 mg  40 mg Oral Daily Laverle Hobby, PA-C   40 mg at 03/30/15 0258  . busPIRone (BUSPAR) tablet 5 mg  5 mg Oral BID Jenne Campus, MD   5 mg at 03/30/15 0813  . clopidogrel (PLAVIX) tablet 75 mg  75 mg Oral Daily Laverle Hobby, PA-C   75 mg at 03/30/15 5277  . DULoxetine (CYMBALTA) DR capsule 60 mg  60 mg Oral Daily Laverle Hobby, PA-C   60 mg at 03/30/15 8242  . feeding supplement (ENSURE ENLIVE) (ENSURE ENLIVE) liquid 237 mL  237 mL Oral BID BM Myer Peer Cobos, MD   237 mL at 03/30/15 1000  . finasteride (PROSCAR) tablet 5 mg  5 mg Oral Daily Laverle Hobby, PA-C   5 mg at 03/30/15 0813  . isosorbide mononitrate (IMDUR) 24 hr tablet 30 mg  30 mg Oral Daily Laverle Hobby, PA-C   30 mg at 03/30/15 0815  . magnesium hydroxide (MILK OF MAGNESIA) suspension 30 mL  30 mL Oral Daily PRN Laverle Hobby, PA-C      . metoprolol (LOPRESSOR) tablet  100 mg  100 mg Oral BID Laverle Hobby, PA-C   100 mg at 03/30/15 0815  . nitroGLYCERIN (NITROSTAT) SL tablet 0.4 mg  0.4 mg Sublingual Q5 min PRN Laverle Hobby, PA-C      . pantoprazole (PROTONIX) EC tablet 40 mg  40 mg Oral Q1200 Laverle Hobby, PA-C   40 mg at 03/30/15 1322  . potassium chloride SA (K-DUR,KLOR-CON) CR tablet 20 mEq  20 mEq Oral Daily Laverle Hobby, PA-C   20 mEq at 03/30/15 3536  . tamsulosin (FLOMAX) capsule 0.4 mg  0.4 mg Oral Daily Frederico Hamman  E Simon, PA-C   0.4 mg at 03/30/15 2542  . traZODone (DESYREL) tablet 25 mg  25 mg Oral QHS,MR X 1 Jenne Campus, MD   25 mg at 03/29/15 2121  . tuberculin injection 5 Units  5 Units Intradermal Once Benjamine Mola, FNP   5 Units at 03/29/15 1810    Lab Results:  Results for orders placed or performed during the hospital encounter of 03/27/15 (from the past 48 hour(s))  Urinalysis with microscopic     Status: Abnormal   Collection Time: 03/28/15  7:51 PM  Result Value Ref Range   Color, Urine YELLOW YELLOW   APPearance CLOUDY (A) CLEAR   Specific Gravity, Urine 1.012 1.005 - 1.030   pH 5.5 5.0 - 8.0   Glucose, UA NEGATIVE NEGATIVE mg/dL   Hgb urine dipstick TRACE (A) NEGATIVE   Bilirubin Urine NEGATIVE NEGATIVE   Ketones, ur NEGATIVE NEGATIVE mg/dL   Protein, ur 100 (A) NEGATIVE mg/dL   Urobilinogen, UA 0.2 0.0 - 1.0 mg/dL   Nitrite NEGATIVE NEGATIVE   Leukocytes, UA NEGATIVE NEGATIVE   WBC, UA 0-2 <3 WBC/hpf   RBC / HPF 3-6 <3 RBC/hpf    Comment: Performed at [facility]  CBC with Differential/Platelet     Status: Abnormal   Collection Time: 03/29/15  7:27 PM  Result Value Ref Range   WBC 7.5 4.0 - 10.5 K/uL   RBC 3.25 (L) 4.22 - 5.81 MIL/uL   Hemoglobin 9.5 (L) 13.0 - 17.0 g/dL   HCT 29.7 (L) 39.0 - 52.0 %   MCV 91.4 78.0 - 100.0 fL   MCH 29.2 26.0 - 34.0 pg   MCHC 32.0 30.0 - 36.0 g/dL   RDW 14.1 11.5 - 15.5 %   Platelets 300 150 - 400 K/uL   Neutrophils Relative % 74 %   Neutro Abs 5.5  1.7 - 7.7 K/uL   Lymphocytes Relative 17 %   Lymphs Abs 1.2 0.7 - 4.0 K/uL   Monocytes Relative 7 %   Monocytes Absolute 0.5 0.1 - 1.0 K/uL   Eosinophils Relative 2 %   Eosinophils Absolute 0.2 0.0 - 0.7 K/uL   Basophils Relative 0 %   Basophils Absolute 0.0 0.0 - 0.1 K/uL    Comment: Performed at [facility]    Physical Findings: AIMS:  , ,  ,  ,    CIWA:    COWS:     Musculoskeletal: Strength & Muscle Tone: within normal limits Gait & Station: normal Patient leans: N/A  Psychiatric Specialty Exam: ROS no fever, no chills , denies dysuria/  hematuria at this time. No nausea or vomiting  Blood pressure 117/69, pulse 82, temperature 97.7 F (36.5 C), temperature source Oral, resp. rate 18, height 5' 5.25" (1.657 m), weight 198 lb (89.812 kg).Body mass index is 32.71 kg/(m^2).  General Appearance: Fairly Groomed  Engineer, water::  Good  Speech:  Normal Rate  Volume:  Normal  Mood:   Partially improved, less depressed   Affect:  Constricted but more reactive   Thought Process:  Linear  Orientation:  Other:  fully alert and attentive   Thought Content:  no hallucinations, no delusions, not internally preoccupied   Suicidal Thoughts:  No- denies any current SI or any current self injurious ideations  Homicidal Thoughts:  No  Memory:  recent and remote grossly intact   Judgement:  Other:  improving  Insight:  improving   Psychomotor Activity:  Normal  Concentration:  Good  Recall:  Good  Fund of Knowledge:Good  Language: Good  Akathisia:  Negative  Handed:  Right  AIMS (if indicated):     Assets:  Desire for Improvement Resilience  ADL's:  Fair   Cognition: WNL  Sleep:  Number of Hours: 6.75  Assessment - at this time patient is improving gradually- he remains depressed , but less severely so, and seems more future oriented and optimistic. He is not currently suicidal or psychotic .  He has history of BPH, and had been struggling with urinary retention  prior to his transfer to psychiatric unit, but at this time voiding spontaneously and reporting  Slowly decreasing incontinence . Very interested in going to an ALF setting after discharge.  Treatment Plan Summary: Daily contact with patient to assess and evaluate symptoms and progress in treatment, Medication management, Plan inpatient treatment and medications as below  Continue Cymbalta 60 mgrs QDAY for depression and anxiety Continue Buspar at 5 mgrs TID for anxiety Continue Flomax for urinary symptoms/ BPH Continue Trazodone 25 mgrs QHS PRN insomnia Continue Xanax to 0.5 mgrs QDAY - for anxiety- as discussed with patient the goal is to taper off BZD as tolerated .  Continue Plavix, Nitroglycerin PRNs to address history of CAD Continue Protonix for GERD  Continue to encourage  Milieu/ group  participation  To work on coping skills  And reduction of symptoms. CSW/Treatment team working on disposition options.  Patient being assessed for ALF .      COBOS, [place] 03/30/2015, 2:14 PM

## 2015-03-30 NOTE — Progress Notes (Signed)
D: Patient alert and oriented x 4. Patient denies pain/SI/HI/AVH. Patient requested some incontinent pad for urgency incontinence. Patient reports he has been having issues since he had a cath removed.    A: Staff to monitor Q 15 mins for safety. Encouragement and support offered. Scheduled medications administered per orders. R: Patient remains safe on the unit. Patient attended group tonight. Patient visible on hte unit and interacting with peers. Patient taking administered medications.

## 2015-03-30 NOTE — [facility] Group Notes (Signed)
The focus of this group is to educate the patient on the purpose and policies of crisis stabilization and provide a format to answer questions about their admission.  The group details unit policies and expectations of patients while admitted. Self inventory group. Patient attended this group.

## 2015-03-30 NOTE — [facility] Group Notes (Signed)
[facility] Group Notes:  (Clinical Social Work)  03/30/2015     1:15-2:15PM  Summary of Progress/Problems:   The main focus of today's process group was to learn how to use a decisional balance exercise to move forward in the Stages of Change, which were described and discussed.  Patients listed needs on the whiteboard and unhealthy coping techniques often used to fill needs.  Motivational Interviewing and the whiteboard were utilized to help patients explore in depth the perceived benefits and costs of unhealthy coping techniques, as well as the  benefits and costs of replacing that with a healthy coping skills.  A handout was distributed for patients to be able to do this exercise for themselves.   The patient expressed that their own unhealthy coping involves isolation.  He listened attentively, and made insightful comments from time to time, asked questions of the group and was generally very interactive and appropriate throughout the group session.  Type of Therapy:  Group Therapy - Process   Participation Level:  Active  Participation Quality:  Appropriate, Attentive and Sharing  Affect:  Depressed  Cognitive:  Alert, Appropriate and Oriented  Insight:  Engaged  Engagement in Therapy:  Engaged  Modes of Intervention:  Education, Motivational Interviewing  Ambrose Mantle, LCSW 03/30/2015, 5:17 PM

## 2015-03-30 NOTE — [facility] Group Notes (Signed)
Adult Psychoeducational Group Note  Date:  03/30/2015 Time:  11:31 AM  Group Topic/Focus:  Making Healthy Choices:   The focus of this group is to help patients identify negative/unhealthy choices they were using prior to admission and identify positive/healthier coping strategies to replace them upon discharge.  Participation Level:    Participation Quality:    Affect:    Cognitive:    Insight:   Engagement in Group:    Modes of Intervention:    Additional Comments:  Patient did not attend the " Making Healthy Choices Group"  Barton Fanny 03/30/2015, 11:31 AM

## 2015-03-31 MED ORDER — BUSPIRONE HCL 10 MG PO TABS
10.0000 mg | ORAL_TABLET | Freq: Three times a day (TID) | ORAL | Status: DC
  Administered 2015-04-01 – 2015-04-02 (×5): 10 mg via ORAL
  Filled 2015-03-31 (×10): qty 1

## 2015-03-31 NOTE — Progress Notes (Signed)
D:  Patient's self inventory sheet, patient sleeps good, sleep medication is helpful.  Fair appetite, low energy level, poor concentration.  Rated depression 5, hopeless 4, anxiety 8.  Denied withdrawals.   Denied SI.  Pain discomfort bladder, worst pain in past 24 hours is #2.  Pain medication makes him feel horrible.  Flomax is helpful.  Feels better physically today.  Patient needs place to stay after discharge and this is his major worry while in the hospital.  Does not want to be discharged too early, feels he needs more help, needs financial asssitance to purchase most of his medications. A:  Medications administered per MD orders.  Emotional support and encouragement given patient. R:  Denied SI and HI, contracts for safety.  Denied A/V hallucinations.  Safety maintained with 15 minute checks.

## 2015-03-31 NOTE — Progress Notes (Signed)
Adult Psychoeducational Group Note  Date:  03/31/2015 Time:  9:10 PM  Group Topic/Focus:  Wrap-Up Group:   The focus of this group is to help patients review their daily goal of treatment and discuss progress on daily workbooks.  Participation Level:  Active  Participation Quality:  Appropriate and Attentive  Affect:  Appropriate  Cognitive:  Appropriate  Insight: Appropriate and Good  Engagement in Group:  Engaged  Modes of Intervention:  Education  Additional Comments:   Pt's was asked to provide whom they consider a healthy support system based on their group topic this morning. Pt consider her peers to be his healthy support system.   [name] [name] 03/31/2015, 9:10 PM

## 2015-03-31 NOTE — [facility] Group Notes (Signed)
[facility] Group Notes:  (Clinical Social Work)  03/31/2015  1:15-2:15PM  Summary of Progress/Problems:   The main focus of today's process group was to   1)  discuss the importance of adding supports  2)  define health supports versus unhealthy supports  3)  identify the patient's current unhealthy supports and plan how to handle them  4)  Identify the patient's current healthy supports and plan what to add.  An emphasis was placed on using counselor, doctor, therapy groups, 12-step groups, and problem-specific support groups to expand supports.    The patient expressed full comprehension of the concepts presented, and agreed that there is a need to add more supports.  The patient stated he has no supports, although he is hopeful that an assisted living facility will accept him and become supportive.  He talked at length about things that have happened with his children who are adults and how they will not allow him in their lives.  He was open to group suggestions and also to the suggestions of having a therapist and working through possible scenarios with a therapist in reestablishing a relationship.  Type of Therapy:  Process Group with Motivational Interviewing  Participation Level:  Active  Participation Quality:  Appropriate, Attentive, Sharing and Supportive  Affect:  Depressed  Cognitive:  Alert and Appropriate  Insight:  Engaged  Engagement in Therapy:  Engaged  Modes of Intervention:   Education, Support and Processing, Activity  Ambrose Mantle, LCSW 03/31/2015

## 2015-03-31 NOTE — [facility] Group Notes (Signed)
The focus of this group is to educate the patient on the purpose and policies of crisis stabilization and provide a format to answer questions about their admission.  The group details unit policies and expectations of patients while admitted.  Patient did not attend 0900 nurse education orientation group this morning.  Patient stayed in bed.   

## 2015-03-31 NOTE — Plan of Care (Signed)
Problem: Consults Goal: Depression Patient Education See Patient Education Module for education specifics.  Outcome: Progressing Nurse discussed depression/coping skills with patient.        

## 2015-03-31 NOTE — Progress Notes (Signed)
Patient ID: [name] [name], male   DOB: [birth year], 69 y.o.   MRN: [id number] D: Client in dayroom watching TV, reports of his day "better as the day went along" "sleeping better" depression "4" of 10, "got to know some people, good people and staff" Client reports he moved down to [facility], because he and kids weren't getting along. "it didn't work out, came back to [facility]" "Goal is some type of assisted living" A: Writer provided emotional support, reviewed medication, administered as prescribed. Client encouraged to speak to SW regarding resources for housing. R: Client is safe on the unit, attended group.

## 2015-03-31 NOTE — Progress Notes (Signed)
Nurse discussed patient's PPD with NP JW who stated PPD was negative.

## 2015-03-31 NOTE — Progress Notes (Signed)
D: Patient alert and oriented x 4. Patient denies pain/HI/AVH. Patient reports he is having some passive SI with no plan and was able to contract with this Clinical research associate.  Patient reports, "my depression seems worse in the mornings, and everyone else seems happy."  This writer reassured patient and gave support.  A: Staff to monitor Q 15 mins for safety. Encouragement and support offered. Scheduled medications administered per orders. R: Patient remains safe on the unit. Patient attended group tonight. Patient visible on hte unit and interacting with peers. Patient taking administered medications.

## 2015-03-31 NOTE — Progress Notes (Addendum)
Patient ID: [name] [name], male   DOB: 1945-11-22, 69 y.o.   MRN: [id number] [facility] MD Progress Note  03/31/2015 6:41 PM Kalven Ganim  MRN:  [id number] Subjective:  Reports some improvement in mood overall, but still feels depressed, and notices a circadian variation to depression, usually feeling worse in AM , a little better as day goes on. Denies any suicidal ideations, and reports ongoing hope that he will continue to improve, and in particular that he will be accepted into a structured setting such as an ALF after discharge . He is very apprehensive about homelessness - states " I don't think I  could survive that" Denies medication side effects. Voiding spontaneously , still incontinent - using adult diaper or pad to address  Objective : I have  Reviewed chart notes and have met with patient. Still depressed and ruminative about stressors but improving- seems more hopeful, and has been less isolative and more visible on unit. No disruptive or agitated behaviors on unit.  Denies suicidal ideations on unit . As noted, fearful of homelessness and overwhelmed about this concern.  Tolerating medications well . Principal Problem: MDD (major depressive disorder), recurrent episode, moderate ([place]) Diagnosis:   Patient Active Problem List   Diagnosis Date Noted  . MDD (major depressive disorder), recurrent episode, moderate ([place]) [F33.1] 03/27/2015  . Major depressive disorder, recurrent episode, moderate ([place]) [F33.1] 03/25/2015  . Midsternal chest pain [R07.2] 03/23/2015  . BPH (benign prostatic hypertrophy) [N40.0] 03/23/2015  . Homelessness [Z59.0] 03/23/2015  . Elevated troponin [R79.89] 03/23/2015  . Hypokalemia [E87.6] 03/22/2015  . Hydronephrosis [N13.30] 03/22/2015  . Urinary retention due to benign prostatic hyperplasia [N28.89, R33.8] 03/22/2015  . Unstable angina ([place]) [I20.0] 03/19/2015  . Chest pain [R07.9] 03/19/2015  . Abnormal nuclear cardiac imaging test [R93.1] 03/19/2015  . OBESITY,  CLASS III [E66.01] 09/04/2008  . EDEMA [R60.9] 08/10/2008  . DERMATOPHYTOSIS OF GROIN AND PERIANAL AREA [B35.6] 12/28/2007  . CAD S/P percutaneous coronary angioplasty - h/p PCI to RCA; Moderate LAD lesion with FFR 0.85 (med Rx) [I25.10, Z98.61] 10/06/2007  . Dyslipidemia [E78.5] 03/29/2007  . DEPRESSION/ANXIETY [F34.1] 03/29/2007  . Essential hypertension [I10] 03/29/2007  . GASTROESOPHAGEAL REFLUX DISEASE [K21.9] 03/29/2007  . ALLERGY [T78.40XA] 03/29/2007  . PROSTATITIS, HX OF [Z87.448] 03/29/2007  . 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 Edema   . Dermatophytosis of groin and perianal area   . CAD (coronary artery disease)     cath '06, repeat '08 non-obstructive disease  . GERD (gastroesophageal reflux disease)   . Hyperlipidemia   . Allergy   . History of prostatitis   . History of BPH   . Hypertension   . Anxiety   . Depression   . Myocardial infarction ~ 08/2014    Past Surgical History  Procedure Laterality Date  . Tonsillectomy and adenoidectomy    . Coronary angioplasty with stent placement  ~ 08/2014    "1 stent"  . Cardiac catheterization  05/07/2005   . Cardiac catheterization N/A 03/20/2015    Procedure: Left Heart Cath and Coronary Angiography;  Surgeon: Belva Crome, MD;  Location: [facility];  Service: Cardiovascular;  Laterality: N/A;   Family History:  Family History  Problem Relation Age of Onset  . Stroke Mother   . Coronary artery disease Father   . Heart attack Father   .  Cancer Neg Hx     colon or prostate cancer   Social History:  History  Alcohol Use  . Yes    Comment: ocassionally     History  Drug Use No    Social History   Social History  . Marital Status: Divorced    Spouse Name: N/A  . Number of Children: N/A  . Years of Education: N/A   Social History Main Topics  . Smoking status: Former  Smoker    Types: Cigars  . Smokeless tobacco: Never Used     Comment: quit smoking in 2010  . Alcohol Use: Yes     Comment: ocassionally  . Drug Use: No  . Sexual Activity:    Partners: Female   Other Topics Concern  . 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 acetaminophen (TYLENOL) tablet 650 mg  650 mg Oral Q6H PRN Laverle Hobby, PA-C      . ALPRAZolam (XANAX XR) 24 hr tablet 0.5 mg  0.5 mg Oral BH-q7a Myer Peer Yiselle Babich, MD   0.5 mg at 03/31/15 0643  . alum & mag hydroxide-simeth (MAALOX/MYLANTA) 200-200-20 MG/5ML suspension 30 mL  30 mL Oral Q4H PRN Laverle Hobby, PA-C      . aspirin EC tablet 81 mg  81 mg Oral Daily Laverle Hobby, PA-C   81 mg at 03/31/15 0839  . atorvastatin (LIPITOR) tablet 40 mg  40 mg Oral Daily Laverle Hobby, PA-C   40 mg at 03/31/15 0973  . busPIRone (BUSPAR) tablet 5 mg  5 mg Oral BID Jenne Campus, MD   5 mg at 03/31/15 1712  . clopidogrel (PLAVIX) tablet 75 mg  75 mg Oral Daily Laverle Hobby, PA-C   75 mg at 03/31/15 0839  . DULoxetine (CYMBALTA) DR capsule 60 mg  60 mg Oral Daily Laverle Hobby, PA-C   60 mg at 03/31/15 0839  . feeding supplement (ENSURE ENLIVE) (ENSURE ENLIVE) liquid 237 mL  237 mL Oral BID BM Myer Peer Khloi Rawl, MD   237 mL at 03/31/15 1600  . finasteride (PROSCAR) tablet 5 mg  5 mg Oral Daily Laverle Hobby, PA-C   5 mg at 03/31/15 5329  . isosorbide mononitrate (IMDUR) 24 hr tablet 30 mg  30 mg Oral Daily Laverle Hobby, PA-C   30 mg at 03/31/15 9242  . magnesium hydroxide (MILK OF MAGNESIA) suspension 30 mL  30 mL Oral Daily PRN Laverle Hobby, PA-C      . metoprolol  (LOPRESSOR) tablet 100 mg  100 mg Oral BID Laverle Hobby, PA-C   100 mg at 03/31/15 1713  . nitroGLYCERIN (NITROSTAT) SL tablet 0.4 mg  0.4 mg Sublingual Q5 min PRN Laverle Hobby, PA-C      . pantoprazole (PROTONIX) EC tablet 40 mg  40 mg Oral Q1200 Laverle Hobby, PA-C   40 mg at 03/31/15 1207  . potassium chloride SA (K-DUR,KLOR-CON) CR tablet 20 mEq  20 mEq Oral Daily Laverle Hobby, PA-C   20 mEq at 03/31/15 0840  . tamsulosin (FLOMAX) capsule 0.4 mg  0.4 mg Oral Daily Laverle Hobby, PA-C   0.4  mg at 03/31/15 0840  . traZODone (DESYREL) tablet 25 mg  25 mg Oral QHS,MR X 1 Jenne Campus, MD   25 mg at 03/30/15 2151    Lab Results:  Results for orders placed or performed during the hospital encounter of 03/27/15 (from the past 48 hour(s))  CBC with Differential/Platelet     Status: Abnormal   Collection Time: 03/29/15  7:27 PM  Result Value Ref Range   WBC 7.5 4.0 - 10.5 K/uL   RBC 3.25 (L) 4.22 - 5.81 MIL/uL   Hemoglobin 9.5 (L) 13.0 - 17.0 g/dL   HCT 29.7 (L) 39.0 - 52.0 %   MCV 91.4 78.0 - 100.0 fL   MCH 29.2 26.0 - 34.0 pg   MCHC 32.0 30.0 - 36.0 g/dL   RDW 14.1 11.5 - 15.5 %   Platelets 300 150 - 400 K/uL   Neutrophils Relative % 74 %   Neutro Abs 5.5 1.7 - 7.7 K/uL   Lymphocytes Relative 17 %   Lymphs Abs 1.2 0.7 - 4.0 K/uL   Monocytes Relative 7 %   Monocytes Absolute 0.5 0.1 - 1.0 K/uL   Eosinophils Relative 2 %   Eosinophils Absolute 0.2 0.0 - 0.7 K/uL   Basophils Relative 0 %   Basophils Absolute 0.0 0.0 - 0.1 K/uL    Comment: Performed at [facility]    Physical Findings: AIMS:  , ,  ,  ,    CIWA:  CIWA-Ar Total: 1 COWS:  COWS Total Score: 2  Musculoskeletal: Strength & Muscle Tone: within normal limits Gait & Station: normal Patient leans: N/A  Psychiatric Specialty Exam: ROS no fever, no chills , denies dysuria/  hematuria at this time. No nausea or vomiting  Blood pressure 109/56, pulse 70, temperature 97.7 F (36.5 C),  temperature source Oral, resp. rate 18, height 5' 5.25" (1.657 m), weight 198 lb (89.812 kg).Body mass index is 32.71 kg/(m^2).  General Appearance: slightly improved grooming  Eye Contact::  Good  Speech:  Normal Rate  Volume:  Normal  Mood:   Partially improved, less depressed   Affect:  Constricted but more reactive   Thought Process:  Linear  Orientation:  Other:  fully alert and attentive   Thought Content:  no hallucinations, no delusions, not internally preoccupied   Suicidal Thoughts:  No- denies any current SI or any current self injurious ideations  Homicidal Thoughts:  No  Memory:  recent and remote grossly intact   Judgement:  Other:  improving  Insight:  improving   Psychomotor Activity:  Normal  Concentration:  Good  Recall:  Good  Fund of Knowledge:Good  Language: Good  Akathisia:  Negative  Handed:  Right  AIMS (if indicated):     Assets:  Desire for Improvement Resilience  ADL's:  Fair   Cognition: WNL  Sleep:  Number of Hours: 6.5  Assessment -  Gradual improvement, less severe depression, although does remain sad, depressed, and anxious . Not suicidal or psychotic, and future oriented .   Voiding spontaneously , incontinent at night , no dysuria, no hematuria reported. Feels Flomax has helped . Very interested in going to an ALF setting after discharge.  Treatment Plan Summary: Daily contact with patient to assess and evaluate symptoms and progress in treatment, Medication management, Plan inpatient treatment and medications as below  Continue Cymbalta 60 mgrs QDAY for depression and anxiety Increase Buspar to 10  mgrs TID for  Ongoing symptoms of anxiety Continue Flomax for  urinary symptoms/ BPH Continue Trazodone 25 mgrs QHS PRN insomnia Discontinue   Xanax -as discussed with patient the goal is to taper off BZD as tolerated .  Continue Plavix, Nitroglycerin PRNs to address history of CAD Continue Protonix for GERD  Continue to encourage  Milieu/ group   participation  To work on coping skills  And reduction of symptoms. CSW/Treatment team working on disposition options.  Patient being assessed for ALF .      Benford Asch, Felicita Gage 03/31/2015, 6:41 PM

## 2015-04-01 DIAGNOSIS — R45851 Suicidal ideations: Secondary | ICD-10-CM

## 2015-04-01 MED ORDER — TAMSULOSIN HCL 0.4 MG PO CAPS
0.8000 mg | ORAL_CAPSULE | Freq: Every day | ORAL | Status: DC
  Administered 2015-04-02 – 2015-04-09 (×8): 0.8 mg via ORAL
  Filled 2015-04-01 (×10): qty 2

## 2015-04-01 MED ORDER — METOPROLOL TARTRATE 50 MG PO TABS
75.0000 mg | ORAL_TABLET | Freq: Two times a day (BID) | ORAL | Status: DC
  Administered 2015-04-02 – 2015-04-09 (×12): 75 mg via ORAL
  Filled 2015-04-01 (×21): qty 1

## 2015-04-01 MED ORDER — TAMSULOSIN HCL 0.4 MG PO CAPS
0.4000 mg | ORAL_CAPSULE | Freq: Once | ORAL | Status: AC
  Administered 2015-04-01: 0.4 mg via ORAL
  Filled 2015-04-01 (×2): qty 1

## 2015-04-01 NOTE — [facility] Group Notes (Signed)
[facility] LCSW Aftercare Discharge Planning Group Note  04/01/2015 8:45 AM  Pt did not attend, declined invitation.   Chad Cordial, LCSWA 04/01/2015 1:30 PM

## 2015-04-01 NOTE — Plan of Care (Signed)
Problem: Ineffective individual coping Goal: STG: Patient will remain free from self harm Patient has remained free from self harm and verbalizes to plan for suicidal thoughts.

## 2015-04-01 NOTE — [facility] Group Notes (Signed)
[facility] LCSW Group Therapy  04/01/2015 1:15pm  Type of Therapy:  Group Therapy vercoming Obstacles  Participation Level:  Active  Participation Quality:  Appropriate   Affect:  Appropriate  Cognitive:  Appropriate and Oriented  Insight:  Developing/Improving and Improving  Engagement in Therapy:  Improving  Modes of Intervention:  Discussion, Exploration, Problem-solving and Support  Description of Group:   In this group patients will be encouraged to explore what they see as obstacles to their own wellness and recovery. They will be guided to discuss their thoughts, feelings, and behaviors related to these obstacles. The group will process together ways to cope with barriers, with attention given to specific choices patients can make. Each patient will be challenged to identify changes they are motivated to make in order to overcome their obstacles. This group will be process-oriented, with patients participating in exploration of their own experiences as well as giving and receiving support and challenge from other group members.  Summary of Patient Progress: Pt identified fear and anxiety as his main obstacle at this point as his housing situation is uncertain at this point. He is able to relate well to peers and offers encouragement to others. He reports his immediate goal is to find stable housing. He describes this as his main stressor.    Therapeutic Modalities:   Cognitive Behavioral Therapy Solution Focused Therapy Motivational Interviewing Relapse Prevention Therapy   Chad Cordial, LCSWA 04/01/2015 3:33 PM

## 2015-04-01 NOTE — Progress Notes (Signed)
D: Patient states he slept good with the help of his scheduled sleep medication. He states his appetite is good and his energy level is low. He also states his concentration is poor. He rates his depression at a seven. He rates his hopelessness at a seven. He rates his anxiety at a nine. He states he is having suicidal thoughts but does not state a plan. He states no pain. He states his goal today is to establish an assisted living to go to and he said he'll do this by talking to his Child psychotherapist. A: Administered medications and offered encouragement. R: Patient was understanding of medications and receptive.

## 2015-04-01 NOTE — Progress Notes (Signed)
Patient ID: [name] [name], male   DOB: Jan 03, 1946, 69 y.o.   MRN: [id number] [facility] MD Progress Note  04/01/2015 10:32 AM [name] [name]  MRN:  [id number] Subjective:  Pt states: "I'm doing better, I guess. I have been feeling depressed for sure and anxious, but the meds are helping."  Objective:   Pt seen and chart reviewed. Pt is alert/oriented x4, calm, cooperative, and appropriate to situation. He reports generalized suicidal ideation without plan/intent and can contract for safety while on the unit. Denies homicidal ideation and psychosis and does not appear to be responding to internal stimuli. Pt does rate his anxiety and depression both at 7/10. Cites moderate sleep and good appetite. Denies medication side effects at this time.   Principal Problem: MDD (major depressive disorder), recurrent episode, moderate (HCC) Diagnosis:   Patient Active Problem List   Diagnosis Date Noted  . MDD (major depressive disorder), recurrent episode, moderate (HCC) [F33.1] 03/27/2015    Priority: High  . Major depressive disorder, recurrent episode, moderate (HCC) [F33.1] 03/25/2015  . Midsternal chest pain [R07.2] 03/23/2015  . BPH (benign prostatic hypertrophy) [N40.0] 03/23/2015  . Homelessness [Z59.0] 03/23/2015  . Elevated troponin [R79.89] 03/23/2015  . Hypokalemia [E87.6] 03/22/2015  . Hydronephrosis [N13.30] 03/22/2015  . Urinary retention due to benign prostatic hyperplasia [N28.89, R33.8] 03/22/2015  . Unstable angina (HCC) [I20.0] 03/19/2015  . Chest pain [R07.9] 03/19/2015  . Abnormal nuclear cardiac imaging test [R93.1] 03/19/2015  . OBESITY, CLASS III [E66.01] 09/04/2008  . EDEMA [R60.9] 08/10/2008  . DERMATOPHYTOSIS OF GROIN AND PERIANAL AREA [B35.6] 12/28/2007  . CAD S/P percutaneous coronary angioplasty - h/p PCI to RCA; Moderate LAD lesion with FFR 0.85 (med Rx) [I25.10, Z98.61] 10/06/2007  . Dyslipidemia [E78.5] 03/29/2007  . DEPRESSION/ANXIETY [F34.1] 03/29/2007  . Essential hypertension  [I10] 03/29/2007  . GASTROESOPHAGEAL REFLUX DISEASE [K21.9] 03/29/2007  . ALLERGY [T78.40XA] 03/29/2007  . PROSTATITIS, HX OF [Z87.448] 03/29/2007  . Other acquired absence of organ [Z90.89] 03/29/2007   Total Time spent with patient: 15 minutes   Past Medical History:  Past Medical History  Diagnosis Date  . Edema   . Dermatophytosis of groin and perianal area   . CAD (coronary artery disease)     cath '06, repeat '08 non-obstructive disease  . GERD (gastroesophageal reflux disease)   . Hyperlipidemia   . Allergy   . History of prostatitis   . History of BPH   . Hypertension   . Anxiety   . Depression   . Myocardial infarction ~ 08/2014    Past Surgical History  Procedure Laterality Date  . Tonsillectomy and adenoidectomy    . Coronary angioplasty with stent placement  ~ 08/2014    "1 stent"  . Cardiac catheterization  05/07/2005   . Cardiac catheterization N/A 03/20/2015    Procedure: Left Heart Cath and Coronary Angiography;  Surgeon: Lyn Records, MD;  Location: [facility] INVASIVE CV LAB;  Service: Cardiovascular;  Laterality: N/A;   Family History:  Family History  Problem Relation Age of Onset  . Stroke Mother   . Coronary artery disease Father   . Heart attack Father   . Cancer Neg Hx     colon or prostate cancer   Social History:  History  Alcohol Use  . Yes    Comment: ocassionally     History  Drug Use No    Social History   Social History  . Marital Status: Divorced    Spouse Name: N/A  . Number of Children:  N/A  . Years of Education: N/A   Social History Main Topics  . Smoking status: Former Smoker    Types: Cigars  . Smokeless tobacco: Never Used     Comment: quit smoking in 2010  . Alcohol Use: Yes     Comment: ocassionally  . Drug Use: No  . Sexual Activity:    Partners: Female   Other Topics Concern  . 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 acetaminophen (TYLENOL) tablet 650 mg  650 mg Oral Q6H PRN Kerry Hough, PA-C      . alum & mag hydroxide-simeth (MAALOX/MYLANTA) 200-200-20 MG/5ML suspension 30 mL  30 mL Oral Q4H PRN Kerry Hough, PA-C      . aspirin EC tablet 81 mg  81 mg Oral Daily Kerry Hough, PA-C   81 mg at 04/01/15 0831  . atorvastatin (LIPITOR) tablet 40 mg  40 mg Oral Daily Kerry Hough, PA-C   40 mg at 04/01/15 0831  . busPIRone (BUSPAR) tablet 10 mg  10 mg Oral TID Craige Cotta, MD   10 mg at 04/01/15 1610  . clopidogrel (PLAVIX) tablet 75 mg  75 mg Oral Daily Kerry Hough, PA-C   75 mg at 04/01/15 0831  . DULoxetine (CYMBALTA) DR capsule 60 mg  60 mg Oral Daily Kerry Hough, PA-C   60 mg at 04/01/15 9604  . feeding supplement (ENSURE ENLIVE) (ENSURE ENLIVE) liquid 237 mL  237 mL Oral BID BM Craige Cotta, MD   237 mL at 04/01/15 0832  . finasteride (PROSCAR) tablet 5 mg  5 mg Oral Daily Kerry Hough, PA-C   5 mg at 04/01/15 0831  . isosorbide mononitrate (IMDUR) 24 hr tablet 30 mg  30 mg Oral Daily Kerry Hough, PA-C   30 mg at 04/01/15 0831  . magnesium hydroxide (MILK OF MAGNESIA) suspension 30 mL  30 mL Oral Daily PRN Kerry Hough, PA-C      . metoprolol (LOPRESSOR) tablet 100 mg  100 mg Oral BID Kerry Hough, PA-C   100 mg at 04/01/15 0831  . nitroGLYCERIN (NITROSTAT) SL tablet 0.4 mg  0.4 mg Sublingual Q5 min PRN Kerry Hough, PA-C      . pantoprazole (PROTONIX) EC tablet 40 mg  40 mg Oral Q1200 Kerry Hough, PA-C   40 mg at 03/31/15 1207  . potassium chloride SA (K-DUR,KLOR-CON) CR tablet 20 mEq  20 mEq Oral Daily Kerry Hough, PA-C   20 mEq at 04/01/15 0831  . tamsulosin (FLOMAX) capsule 0.4 mg  0.4 mg Oral  Daily Kerry Hough, PA-C   0.4 mg at 04/01/15 0831  . traZODone (DESYREL) tablet 25 mg  25 mg Oral QHS,MR X 1 Craige Cotta, MD   25 mg at 03/31/15 2118    Lab Results:  No results found for this or any previous visit (from the past 48 hour(s)).  Physical Findings: AIMS:  , ,  ,  ,    CIWA:  CIWA-Ar Total: 1 COWS:  COWS Total Score: 2  Musculoskeletal: Strength & Muscle Tone: within normal limits Gait & Station: normal Patient  leans: N/A  Psychiatric Specialty Exam: Review of Systems  Psychiatric/Behavioral: Positive for depression. Negative for suicidal ideas. The patient is nervous/anxious and has insomnia.   All other systems reviewed and are negative.  no fever, no chills , denies dysuria/  hematuria at this time. No nausea or vomiting  Blood pressure 135/70, pulse 73, temperature 98.5 F (36.9 C), temperature source Oral, resp. rate 18, height 5' 5.25" (1.657 m), weight 89.812 kg (198 lb).Body mass index is 32.71 kg/(m^2).  General Appearance: slightly improved grooming  Eye Contact::  Good  Speech:  Normal Rate  Volume:  Normal  Mood:   Partially improved, less depressed   Affect:  Constricted but more reactive   Thought Process:  Linear  Orientation:  Full (Time, Place, and Person)  Thought Content:  no hallucinations, no delusions, not internally preoccupied   Suicidal Thoughts:  Yes.  without intent/plan- generalized, no plan or intent, can contract for safety on the unit at this time.   Homicidal Thoughts:  No  Memory:  recent and remote grossly intact   Judgement:  Other:  improving  Insight:  improving   Psychomotor Activity:  Normal  Concentration:  Good  Recall:  Good  Fund of Knowledge:Good  Language: Good  Akathisia:  Negative  Handed:  Right  AIMS (if indicated):     Assets:  Desire for Improvement Resilience  ADL's:  Fair   Cognition: WNL  Sleep:  Number of Hours: 6.5  Assessment -  Very interested in going to an ALF setting after  discharge.  Treatment Plan Summary: Daily contact with patient to assess and evaluate symptoms and progress in treatment, Medication management, Plan inpatient treatment and medications as below   Medications:  -Continue Cymbalta 60 mg QDAY for depression and anxiety -Continue Buspar to 10  mg TID for  Ongoing symptoms of anxiety.  -Continue Flomax for urinary symptoms/ BPH -Continue Trazodone 25 mgrs QHS PRN insomnia -Continue Plavix, Nitroglycerin PRNs to address history of CAD.  -Continue Protonix for GERD  -Continue to encourage  Milieu/group  participation  To work on coping skills  And reduction of symptoms.  -CSW/Treatment team working on disposition options.  -Patient being assessed for ALF.    [place], Partch, FNP-BC 04/01/2015, 10:32 AM

## 2015-04-01 NOTE — Progress Notes (Signed)
Adult Psychoeducational Group Note  Date:  04/01/2015 Time:  8:45 PM  Group Topic/Focus:  Wrap-Up Group:   The focus of this group is to help patients review their daily goal of treatment and discuss progress on daily workbooks.  Participation Level:  Did Not Attend  Participation Quality:  N/A  Affect:  N/A  Cognitive:  N/A  Insight: None  Engagement in Group:  N/A  Modes of Intervention:  N/A  Additional Comments:  Pt did not attend wrap-up group because pt was asleep.   Cleotilde Neer 04/01/2015, 9:55 PM

## 2015-04-02 ENCOUNTER — Inpatient Hospital Stay: Payer: Medicare Other | Admitting: Family Medicine

## 2015-04-02 DIAGNOSIS — N400 Enlarged prostate without lower urinary tract symptoms: Secondary | ICD-10-CM

## 2015-04-02 DIAGNOSIS — R32 Unspecified urinary incontinence: Secondary | ICD-10-CM

## 2015-04-02 MED ORDER — DULOXETINE HCL 60 MG PO CPEP
90.0000 mg | ORAL_CAPSULE | Freq: Every day | ORAL | Status: DC
  Administered 2015-04-03 – 2015-04-08 (×6): 90 mg via ORAL
  Filled 2015-04-02 (×9): qty 1

## 2015-04-02 MED ORDER — BUSPIRONE HCL 15 MG PO TABS
15.0000 mg | ORAL_TABLET | Freq: Three times a day (TID) | ORAL | Status: DC
  Administered 2015-04-02 – 2015-04-09 (×21): 15 mg via ORAL
  Filled 2015-04-02 (×28): qty 1

## 2015-04-02 NOTE — Progress Notes (Signed)
D: Patient alert and oriented x 4. Patient denies pain/SI/HI/AVH. Patient was in room most of shift. Patient states he is unsure of group home for living arrangements.  A: Staff to monitor Q 15 mins for safety. Encouragement and support offered. Scheduled medications administered per orders. R: Patient remains safe on the unit. Patient taking administered medications.

## 2015-04-02 NOTE — [facility] Group Notes (Signed)
[facility] LCSW Group Therapy 04/02/2015 1:15 PM  Type of Therapy: Group Therapy- Feelings about Diagnosis  Participation Level: Active   Participation Quality:  Appropriate  Affect:  Appropriate  Cognitive: Alert and Oriented   Insight:  Developing   Engagement in Therapy: Developing/Improving and Engaged   Modes of Intervention: Clarification, Confrontation, Discussion, Education, Exploration, Limit-setting, Orientation, Problem-solving, Rapport Building, Dance movement psychotherapist, Socialization and Support  Description of Group:   This group will allow patients to explore their thoughts and feelings about diagnoses they have received. Patients will be guided to explore their level of understanding and acceptance of these diagnoses. Facilitator will encourage patients to process their thoughts and feelings about the reactions of others to their diagnosis, and will guide patients in identifying ways to discuss their diagnosis with significant others in their lives. This group will be process-oriented, with patients participating in exploration of their own experiences as well as giving and receiving support and challenge from other group members.  Summary of Progress/Problems:  Pt participated appropriately in group discussion and interacted well with peers. He discussed learning the difference between happiness and contentment which alleviated feeling pressure to "be happy" all of the time. Pt reports that he likes his intellectual personality which keeps him more healthy.  Therapeutic Modalities:   Cognitive Behavioral Therapy Solution Focused Therapy Motivational Interviewing Relapse Prevention Therapy  Chad Cordial, LCSWA 04/02/2015 5:00 PM

## 2015-04-02 NOTE — Progress Notes (Signed)
Patient ID: [name] [name], male   DOB: February 23, 1946, 69 y.o.   MRN: [id number] [facility] MD Progress Note  04/02/2015 2:43 PM [name] [name]  MRN:  [id number] Subjective:  Some improvement compared to admission, but still depressed, anxious . In particular, feeling anxious about disposition plans . He is wanting to go to an ALF after discharge, and states he had an interview  For an ALF yesterday, but worries that he might be the highest functioning person in the facility, and of his ability to interact meaningfully with peers " if they have serious cognitive problems". However, he states he realizes that " going back to a hotel is not the answer, I would not be able to handle that right now".  Denies medication  Side effects.  Objective : I have  Reviewed chart notes and have met with patient. Patient remains depressed, although better than upon admission. Today he stresses anxiety, as he was visited by ALF staff recently and was told "I would likely be the highest functioning client they would have", leading to worries as above .  Patient responds well to support, encouragement and facilitation of reviewing disposition options, benefits .  He does, as noted above, feel strongly he cannot currently function independently. He is tolerating medications well and does not endorse medication side effects. He is denying any suicidal ideations and contracts for safety, although anxiety about disposition planning has led to some increased depression. No disruptive behaviors on unit, at times isolates in room. Principal Problem: MDD (major depressive disorder), recurrent episode, moderate ([place]) Diagnosis:   Patient Active Problem List   Diagnosis Date Noted  . MDD (major depressive disorder), recurrent episode, moderate ([place]) [F33.1] 03/27/2015  . Major depressive disorder, recurrent episode, moderate ([place]) [F33.1] 03/25/2015  . Midsternal chest pain [R07.2] 03/23/2015  . BPH (benign prostatic hypertrophy) [N40.0]  03/23/2015  . Homelessness [Z59.0] 03/23/2015  . Elevated troponin [R79.89] 03/23/2015  . Hypokalemia [E87.6] 03/22/2015  . Hydronephrosis [N13.30] 03/22/2015  . Urinary retention due to benign prostatic hyperplasia [N28.89, R33.8] 03/22/2015  . Unstable angina ([place]) [I20.0] 03/19/2015  . Chest pain [R07.9] 03/19/2015  . Abnormal nuclear cardiac imaging test [R93.1] 03/19/2015  . OBESITY, CLASS III [E66.01] 09/04/2008  . EDEMA [R60.9] 08/10/2008  . DERMATOPHYTOSIS OF GROIN AND PERIANAL AREA [B35.6] 12/28/2007  . CAD S/P percutaneous coronary angioplasty - h/p PCI to RCA; Moderate LAD lesion with FFR 0.85 (med Rx) [I25.10, Z98.61] 10/06/2007  . Dyslipidemia [E78.5] 03/29/2007  . DEPRESSION/ANXIETY [F34.1] 03/29/2007  . Essential hypertension [I10] 03/29/2007  . GASTROESOPHAGEAL REFLUX DISEASE [K21.9] 03/29/2007  . ALLERGY [T78.40XA] 03/29/2007  . PROSTATITIS, HX OF [Z87.448] 03/29/2007  . 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 Edema   . Dermatophytosis of groin and perianal area   . CAD (coronary artery disease)     cath '06, repeat '08 non-obstructive disease  . GERD (gastroesophageal reflux disease)   . Hyperlipidemia   . Allergy   . History of prostatitis   . History of BPH   . Hypertension   . Anxiety   . Depression   . Myocardial infarction ~ 08/2014    Past Surgical History  Procedure Laterality Date  . Tonsillectomy and adenoidectomy    . Coronary angioplasty with stent placement  ~ 08/2014    "1 stent"  . Cardiac catheterization  05/07/2005   . Cardiac  catheterization N/A 03/20/2015    Procedure: Left Heart Cath and Coronary Angiography;  Surgeon: Belva Crome, MD;  Location: [facility] CV LAB;  Service: Cardiovascular;  Laterality: N/A;   Family History:  Family History  Problem Relation Age of Onset  . Stroke Mother   . Coronary  artery disease Father   . Heart attack Father   . Cancer Neg Hx     colon or prostate cancer   Social History:  History  Alcohol Use  . Yes    Comment: ocassionally     History  Drug Use No    Social History   Social History  . Marital Status: Divorced    Spouse Name: N/A  . Number of Children: N/A  . Years of Education: N/A   Social History Main Topics  . Smoking status: Former Smoker    Types: Cigars  . Smokeless tobacco: Never Used     Comment: quit smoking in 2010  . Alcohol Use: Yes     Comment: ocassionally  . Drug Use: No  . Sexual Activity:    Partners: Female   Other Topics Concern  . 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 acetaminophen (TYLENOL) tablet 650 mg  650 mg Oral Q6H PRN SLaverle Hobby PA-C   650 mg at 04/02/15 1211  . alum & mag hydroxide-simeth (MAALOX/MYLANTA) 200-200-20 MG/5ML suspension 30 mL  30 mL Oral Q4H PRN SLaverle Hobby PA-C      . aspirin EC tablet 81 mg  81 mg Oral Daily SLaverle Hobby PA-C   81 mg at 04/02/15 03888 . atorvastatin (LIPITOR) tablet 40 mg  40 mg Oral Daily SLaverle Hobby PA-C   40 mg at 04/02/15 02800 . busPIRone (BUSPAR) tablet 10 mg  10 mg Oral TID FJenne Campus MD   10 mg at 04/02/15 1210  . clopidogrel (PLAVIX) tablet 75 mg  75 mg Oral Daily SLaverle Hobby PA-C   75 mg at 04/02/15 03491 . DULoxetine (CYMBALTA) DR capsule 60 mg  60 mg Oral Daily SLaverle Hobby PA-C   60 mg at 04/02/15 0919  . feeding supplement (ENSURE ENLIVE) (ENSURE ENLIVE) liquid 237 mL  237 mL Oral BID BM FJenne Campus MD   237 mL at 04/02/15 0916  . finasteride (PROSCAR) tablet  5 mg  5 mg Oral Daily SLaverle Hobby PA-C   5 mg at 04/02/15 07915 . isosorbide mononitrate (IMDUR) 24 hr tablet 30 mg  30 mg Oral Daily SLaverle Hobby PA-C   30 mg at 04/02/15 0919  . magnesium hydroxide (MILK OF MAGNESIA) suspension 30 mL  30 mL Oral Daily PRN SLaverle Hobby PA-C      . metoprolol tartrate (LOPRESSOR) tablet 75 mg  75 mg Oral BID JBenjamine Mola FNP   75 mg at 04/02/15 00569 . nitroGLYCERIN (NITROSTAT) SL tablet 0.4 mg  0.4 mg Sublingual Q5 min PRN SLaverle Hobby PA-C      . pantoprazole (PROTONIX) EC tablet 40 mg  40 mg Oral Q1200 SLaverle Hobby PA-C   40 mg at 04/02/15 1210  .  potassium chloride SA (K-DUR,KLOR-CON) CR tablet 20 mEq  20 mEq Oral Daily Laverle Hobby, PA-C   20 mEq at 04/02/15 3662  . tamsulosin (FLOMAX) capsule 0.8 mg  0.8 mg Oral Daily Benjamine Mola, FNP   0.8 mg at 04/02/15 0919  . traZODone (DESYREL) tablet 25 mg  25 mg Oral QHS,MR X 1 Jenne Campus, MD   25 mg at 04/01/15 2124    Lab Results:  No results found for this or any previous visit (from the past 48 hour(s)).  Physical Findings: AIMS: Facial and Oral Movements Muscles of Facial Expression: None, normal Lips and Perioral Area: None, normal Jaw: None, normal Tongue: None, normal,Extremity Movements Upper (arms, wrists, hands, fingers): None, normal Lower (legs, knees, ankles, toes): None, normal, Trunk Movements Neck, shoulders, hips: None, normal, Overall Severity Severity of abnormal movements (highest score from questions above): None, normal Incapacitation due to abnormal movements: None, normal Patient's awareness of abnormal movements (rate only patient's report): No Awareness, Dental Status Current problems with teeth and/or dentures?: No Does patient usually wear dentures?: No  CIWA:  CIWA-Ar Total: 1 COWS:  COWS Total Score: 1  Musculoskeletal: Strength & Muscle Tone: within normal limits Gait & Station: normal Patient leans: N/A  Psychiatric Specialty  Exam: ROS  Voiding spontaneously, ongoing incontinence   Blood pressure 150/72, pulse 73, temperature 98.5 F (36.9 C), temperature source Oral, resp. rate 18, height 5' 5.25" (1.657 m), weight 198 lb (89.812 kg).Body mass index is 32.71 kg/(m^2).  General Appearance: slightly improved grooming  Eye Contact::  Good  Speech:  Normal Rate  Volume:  Normal  Mood:   Depression has improved partially since admission  Affect:   More reactive, less depressed, more anxious about discharge options  Thought Process:  Linear  Orientation:  Other:  fully alert and attentive   Thought Content:  no hallucinations, no delusions, not internally preoccupied   Suicidal Thoughts:  No- denies any current SI or any current self injurious ideations  Homicidal Thoughts:  No  Memory:  recent and remote grossly intact   Judgement:  Other:  improving  Insight:  improving   Psychomotor Activity:  Normal  Concentration:  Good  Recall:  Good  Fund of Knowledge:Good  Language: Good  Akathisia:  Negative  Handed:  Right  AIMS (if indicated):     Assets:  Desire for Improvement Resilience  ADL's:  Fair   Cognition: WNL  Sleep:  Number of Hours: 6.75  Assessment -  Patient remains depressed but has improved compared to admission. He is tolerating medications well thus far. He is anxious and ruminative about disposition issues, mainly going to ALF  , but is still motivated in this option as best course of action because he feels strongly he would not do well independently at this time. Ongoing incontinence is a source of concern , embarrassment for patient- he is now voiding spontaneously but remains incontinent. He is tolerating Flomax well. He is tolerating medications ( Cymbalta, Buspar ) well thus far .   Treatment Plan Summary: Daily contact with patient to assess and evaluate symptoms and progress in treatment, Medication management, Plan inpatient treatment and medications as below  Increase  Cymbalta  To  90  mgrs QDAY for depression and anxiety Increase Buspar to 15  mgrs TID for  Ongoing symptoms of anxiety Continue Flomax for urinary symptoms/ BPH Continue Trazodone 25 mgrs QHS PRN insomnia Have requested Hospitalist Consult to evaluate further management options , if any ,  to better control, address incontinence  .  Continue Plavix, Nitroglycerin PRNs to address history of CAD Continue Protonix for GERD  Continue to encourage  Milieu/ group  participation  To work on coping skills  And reduction of symptoms. CSW/Treatment team working on disposition options.  Patient concerned about ALF disposition, but states he is motivated in this option as it is the best for him at present      [place], [facility] 04/02/2015, 2:43 PM

## 2015-04-02 NOTE — Consult Note (Signed)
[facility] Medical Consultation  [name] [name] ZOX:[id number] DOB: [birth year] DOA: 03/27/2015 PCP: [name], MD   Requesting physician: [name]  Date of consultation: 04/02/15 Reason for consultation: BPH   Impression/Recommendations Principal Problem:   MDD (major depressive disorder), recurrent episode, moderate (HCC)  69 y/o male with PMH of HTN, HPL, CAD s/p DES, BPH is admitted to [facility] for suicidal ideation, depression. Patient was recently hospitalized for chest pain. He also found to have urinary retention and placed foley catheter which was removed prior to discharge.  1. BPH. With urinary symptoms-intermittent incontinence. discontinued foley several days ago. Recent UA: unremarkable, no s/s of urinary tract infection. No s/s of urinary retention.  -He was restarted on flomax, finesteride recently. I would recommended these medications and f/u with urology as outpatient in 2-3 weeks. i will not recommend oxybutinin at this time    Thanks you for your consultation. I will sign off.  Please contact me if I can be of assistance in the meanwhile. Thank you for this consultation.  Chief Complaint: urinary incontinence   HPI: 69 y/o male with PMH of HTN, HPL, CAD s/p DES, BPH is admitted to [facility] for suicidal ideation, depression. Patient was recently hospitalized for chest pain. He also found to have urinary retention and placed foley catheter which was removed prior to discharge. Patient was supposed to take flomax+finasteride at home. He was not taking his medications for a while. He was seen by urology who recommended to resume medication. Today he complains of episodes of urinary incontinence. He denies dysuria, no fever, chills, no urinary drainage. He also denies cardiopulmonary symptoms, no nausea, vomiting or diarrhea    Review of Systems:  Review of Systems  Constitutional: Negative for fever, chills, weight loss, malaise/fatigue and diaphoresis.  HENT: Negative for  congestion, ear discharge, hearing loss, nosebleeds, sore throat and tinnitus.   Eyes: Negative for blurred vision, double vision, photophobia, pain and discharge.  Respiratory: Negative for cough, hemoptysis, sputum production, shortness of breath and stridor.   Cardiovascular: Negative for chest pain, palpitations, orthopnea and claudication.  Gastrointestinal: Negative for heartburn, nausea, vomiting, abdominal pain and constipation.  Genitourinary: Positive for urgency and frequency. Negative for dysuria, hematuria and flank pain.  Musculoskeletal: Negative for myalgias, back pain, joint pain and neck pain.  Skin: Negative for itching and rash.  Neurological: Negative for dizziness, tingling, speech change, focal weakness, seizures, weakness and headaches.  Psychiatric/Behavioral: Negative for depression, suicidal ideas, hallucinations and substance abuse. The patient is not nervous/anxious.      Past Medical History  Diagnosis Date  . Edema   . Dermatophytosis of groin and perianal area   . CAD (coronary artery disease)     cath '06, repeat '08 non-obstructive disease  . GERD (gastroesophageal reflux disease)   . Hyperlipidemia   . Allergy   . History of prostatitis   . History of BPH   . Hypertension   . Anxiety   . Depression   . Myocardial infarction ~ 08/2014   Past Surgical History  Procedure Laterality Date  . Tonsillectomy and adenoidectomy    . Coronary angioplasty with stent placement  ~ 08/2014    "1 stent"  . Cardiac catheterization  05/07/2005   . Cardiac catheterization N/A 03/20/2015    Procedure: Left Heart Cath and Coronary Angiography;  Surgeon: Lyn Records, MD;  Location: [facility] INVASIVE CV LAB;  Service: Cardiovascular;  Laterality: N/A;   Social History:  reports that he has quit smoking. His smoking  use included Cigars. He has never used smokeless tobacco. He reports that he drinks alcohol. He reports that he does not use illicit drugs.  Allergies   Allergen Reactions  . Aspirin     Patient states can take low dose aspirin    Family History  Problem Relation Age of Onset  . Stroke Mother   . Coronary artery disease Father   . Heart attack Father   . Cancer Neg Hx     colon or prostate cancer    Prior to Admission medications   Medication Sig Start Date End Date Taking? Authorizing Provider  ALPRAZolam (ALPRAZOLAM XR) 2 MG 24 hr tablet Take 1 tablet (2 mg total) by mouth every morning. 07/24/13   Jacques Navy, MD  ALPRAZolam Prudy Feeler) 0.5 MG tablet Take 1 tablet (0.5 mg total) by mouth at bedtime as needed for sleep. 07/24/13   Jacques Navy, MD  aspirin EC 81 MG EC tablet Take 1 tablet (81 mg total) by mouth daily. 03/24/15   Azalee Course, PA  atorvastatin (LIPITOR) 40 MG tablet Take 40 mg by mouth daily.    Historical Provider, MD  clopidogrel (PLAVIX) 75 MG tablet Take 1 tablet (75 mg total) by mouth daily. 03/25/15   Azalee Course, PA  DULoxetine (CYMBALTA) 60 MG capsule TAKE 1 CAPSULE (60 MG TOTAL) BY MOUTH DAILY. 07/24/13   Jacques Navy, MD  finasteride (PROSCAR) 5 MG tablet Take 1 tablet (5 mg total) by mouth daily. 07/24/13   Jacques Navy, MD  isosorbide mononitrate (IMDUR) 30 MG 24 hr tablet Take 1 tablet (30 mg total) by mouth daily. 03/24/15   Azalee Course, PA  LEVITRA 20 MG tablet TAKE 1 TABLET (20 MG TOTAL) BY MOUTH DAILY AS NEEDED. 11/22/12   Jacques Navy, MD  metoprolol (LOPRESSOR) 100 MG tablet Take 1 tablet (100 mg total) by mouth 2 (two) times daily. 03/25/15   Azalee Course, PA  nitroGLYCERIN (NITROSTAT) 0.4 MG SL tablet Place 1 tablet (0.4 mg total) under the tongue every 5 (five) minutes as needed for chest pain. 03/24/15   Azalee Course, PA  pantoprazole (PROTONIX) 40 MG tablet Take 1 tablet (40 mg total) by mouth daily at 12 noon. 03/24/15   Azalee Course, PA  potassium chloride SA (K-DUR,KLOR-CON) 20 MEQ tablet Take 1 tablet (20 mEq total) by mouth daily. 03/24/15   Azalee Course, PA  tamsulosin (FLOMAX) 0.4 MG CAPS capsule Take 1  capsule (0.4 mg total) by mouth daily. 07/24/13   Jacques Navy, MD   Physical Exam: Blood pressure 150/72, pulse 73, temperature 98.5 F (36.9 C), temperature source Oral, resp. rate 18, height 5' 5.25" (1.657 m), weight 89.812 kg (198 lb). Filed Vitals:   04/02/15 1028  BP: 150/72  Pulse: 73  Temp:   Resp:      General:  Alert. No distress   Eyes: eom-i, perrla   ENT: no oral ulcers   Neck: supple, no jvd   Cardiovascular: s1,s2 rrr  Respiratory: CTA BL  Abdomen: soft, nt,n d  Skin: no rash   Musculoskeletal: no leg edema   Psychiatric: no hallucinations   Neurologic: CN 2-12 intact. Motor 5/5   Labs on Admission:  Basic Metabolic Panel: No results for input(s): NA, K, CL, CO2, GLUCOSE, BUN, CREATININE, CALCIUM, MG, PHOS in the last 168 hours. Liver Function Tests: No results for input(s): AST, ALT, ALKPHOS, BILITOT, PROT, ALBUMIN in the last 168 hours. No results for input(s): LIPASE, AMYLASE in the  last 168 hours. No results for input(s): AMMONIA in the last 168 hours. CBC:  Recent Labs Lab 03/29/15 1927  WBC 7.5  NEUTROABS 5.5  HGB 9.5*  HCT 29.7*  MCV 91.4  PLT 300   Cardiac Enzymes: No results for input(s): CKTOTAL, CKMB, CKMBINDEX, TROPONINI in the last 168 hours. BNP: Invalid input(s): POCBNP CBG: No results for input(s): GLUCAP in the last 168 hours.  Radiological Exams on Admission: No results found.  EKG: Independently reviewed.   Time spent: >35 minutes   Esperanza Sheets [facility] Pager [phone]  If 7PM-7AM, please contact night-coverage [email or web address] Password [email or web address] 04/02/2015, 4:18 PM

## 2015-04-02 NOTE — Tx Team (Signed)
Interdisciplinary Treatment Plan Update (Adult) Date: 04/02/2015   Date: 04/02/2015 9:21 AM  Progress in Treatment:  Attending groups: Yes  Participating in groups: Yes  Taking medication as prescribed: Yes  Tolerating medication: Yes  Family/Significant othe contact made: No, CSW attempting to contact daughter; Pt trying to get number Patient understands diagnosis: Yes Discussing patient identified problems/goals with staff: Yes  Medical problems stabilized or resolved: Yes  Denies suicidal/homicidal ideation: Yes Patient has not harmed self or Others: Yes   New problem(s) identified: None identified at this time.   Discharge Plan or Barriers: CSW will assess for appropriate discharge plan and relevant barriers.   04/02/15: Pt will discharge to [facility].  Additional comments: n/a   Reason for Continuation of Hospitalization:  Depression Medication stabilization Suicidal ideation  Estimated length of stay: 3-5 days  Review of initial/current patient goals per problem list:   1.  Goal(s): Patient will participate in aftercare plan  Met:  Yes  Target date: 3-5 days from date of admission   As evidenced by: Patient will participate within aftercare plan AEB aftercare provider and housing plan at discharge being identified.  03/28/15: CSW to work with Pt to assess for appropriate discharge plan and faciliate appointments and referrals as needed prior to d/c. 04/02/15: Pt will discharge to [facility] and Follow-up with [facility].  2.  Goal (s): Patient will exhibit decreased depressive symptoms and suicidal ideations.  Met:  No  Target date: 3-5 days from date of admission   As evidenced by: Patient will utilize self rating of depression at 3 or below and demonstrate decreased signs of depression or be deemed stable for discharge by MD. 03/28/15: Pt was admitted with symptoms of depression, rating 10/10. Pt continues to present with flat affect and depressive symptoms.  Pt  will demonstrate decreased symptoms of depression and rate depression at 3/10 or lower prior to discharge. 04/02/15: Pt rates depression at 7/10; denies SI  Attendees:  Patient:    Family:    Physician: Dr. Parke Poisson, MD  04/02/2015 9:21 AM  Nursing: Lars Pinks, RN Case manager  04/02/2015 9:21 AM  Clinical Social Worker Norman Clay, MSW 04/02/2015 9:21 AM  Other: Jake Bathe Liasion 04/02/2015 9:21 AM  Clinical: Gaylan Gerold, RN 04/02/2015 9:21 AM  Other: , RN Charge Nurse 04/02/2015 9:21 AM  Other:     Peri Maris, Latanya Presser MSW

## 2015-04-02 NOTE — Plan of Care (Signed)
Problem: Alteration in mood Goal: STG-Patient reports thoughts of self-harm to staff Outcome: Progressing Patient reports SI and contracts for safety.

## 2015-04-02 NOTE — Progress Notes (Signed)
Recreation Therapy Notes  Animal-Assisted Activity (AAA) Program Checklist/Progress Notes Patient Eligibility Criteria Checklist & Daily Group note for Rec Tx Intervention  Date: 10.04.2016 Time: 2:45pm Location: [address] Dayroom    AAA/T Program Assumption of Risk Form signed by Patient/ or Parent Legal Guardian yes  Patient is free of allergies or sever asthma yes  Patient reports no fear of animals yes  Patient reports no history of cruelty to animals yes  Patient understands his/her participation is voluntary yes  Patient washes hands before animal contact yes  Patient washes hands after animal contact yes  Behavioral Response: Attentive  Education: Hand Washing, Appropriate Animal Interaction   Education Outcome: Acknowledges education.   Clinical Observations/Feedback: Patient actively engaged in session, petting therapy dog appropriately and interacting with peers appropriately.   [name] [name], [name] [name]  [name] [name] 04/02/2015 3:13 PM 

## 2015-04-02 NOTE — Progress Notes (Signed)
Did not attend group 

## 2015-04-02 NOTE — Progress Notes (Signed)
Patient ID: [name] [name], male   DOB: 1946/03/07, 69 y.o.   MRN: [id number]  Adult Psychoeducational Group Note  Date:  04/02/2015 Time:  09:00am  Group Topic/Focus:  Goals Group:   The focus of this group is to help patients establish daily goals to achieve during treatment and discuss how the patient can incorporate goal setting into their daily lives to aide in recovery.  Participation Level:  Did Not Attend  Participation Quality: n/a  Affect: n/a  Cognitive: n/a  Insight: n/a  Engagement in Group: n/a  Modes of Intervention:  Discussion, Education, Orientation and Support  Additional Comments:  Pt did not attend group. Pt in bed asleep.   Aurora Mask 04/02/2015, 9:35 AM

## 2015-04-02 NOTE — Progress Notes (Signed)
Patient ID: [name] [name], male   DOB: Nov 09, 1945, 69 y.o.   MRN: [id number]  DAR: Patient was initially met when he was lying in bed, patient appeared depressed and reported high anxiety related to his discharge plans and if he is going to stay at a group home/assisted living facility.Patient reports, "it scares the hell out of me." With encouragement patient got out of bed and is seen in the milieu now in brighter spirits and is interacting with his peers. He reports his sleep last night was fair, appetite is fair, energy level is low, and concentration level is poor. He rates his depression 8/10, hopelessness 7/10, and anxiety 9/10. Pt. Denies HI and A/V Hallucinations. He continues to endorse passive SI and is able to contract for safety. Patient reported a headache earlier today and received PRN pain medication that was effective. Support and encouragement provided to the patient. Scheduled medications administered to patient per physician's orders. Patient is receptive and cooperative. Q15 minute checks are maintained for safety.

## 2015-04-03 NOTE — Progress Notes (Signed)
D: Patient alert and oriented x 4. Patient denies pain/SI/HI/AVH. Stayed in bed most of shift. Even with Clinical research associate encouragement to come to day room. Patient states he feels more depressed in the mornings than in the evening. Patient is currently resting in bed with eyes closed. Will continue to monitor.  A: Staff to monitor Q 15 mins for safety. Encouragement and support offered. Scheduled medications administered per orders. R: Patient remains safe on the unit.Patient visible on hte unit. Patient taking administered medications.

## 2015-04-03 NOTE — Plan of Care (Signed)
Problem: Alteration in mood Goal: LTG-Patient reports reduction in suicidal thoughts (Patient reports reduction in suicidal thoughts and is able to verbalize a safety plan for whenever patient is feeling suicidal)  Outcome: Progressing Pt denies SI.  He verbally contracts for safety.

## 2015-04-03 NOTE — Progress Notes (Signed)
Pt not seen today, I will FU tomorrow Zannie Cove MD [phone]

## 2015-04-03 NOTE — Progress Notes (Signed)
Recreation Therapy Notes   Date: 10.05.2016 Time: 9:30am Location: [address] Hall Group Room   Group Topic: Stress Management  Goal Area(s) Addresses:  Patient will actively participate in stress management techniques presented during session.   Behavioral Response: Did not attend.   Marykay Lex Jaysiah Marchetta, LRT/CTRS        Deniece Rankin L 04/03/2015 1:14 PM

## 2015-04-03 NOTE — [facility] Group Notes (Signed)
[facility] LCSW Group Therapy 04/03/2015 1:15 PM  Type of Therapy: Group Therapy- Emotion Regulation  Pt did not attend, declined invitation.    Chad Cordial, LCSWA 04/03/2015 4:12 PM

## 2015-04-03 NOTE — Progress Notes (Signed)
Patient ID: [name] [name], male   DOB: [birth year], 69 y.o.   MRN: [id number] [facility] MD Progress Note  04/03/2015 12:40 PM Lola Lofaro  MRN:  [id number] Subjective:  Patient states he remains depressed, but does state he feels better than upon admission. He describes significant anxiety , particularly as he focuses more on discharge planning. States " I feel safe here, and I don't know what the Group Home is going to be like. I guess I fear the unknown". He does state that he realizes that this disposition option is better than " going back to a hotel with no one to help me".  He feels medications are helping partially, denies side effects.   No dysuria, no urgency, urinating without difficulty, ongoing incontinence, but states improved compared to admission. Objective : I have  Reviewed chart notes and have met with patient. Reports some ongoing depression and anxiety, worse in AM. As noted, currently ruminative about transitioning from inpatient unit to ALF . He does respond well to support, reassurance, validation . His affect tends to improve with support. Group participation has been limited, patient tends to spend a lot of time in room. 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 Of note, patient's son has obtained a 55 B against patient. We discussed - he states that he had made " some stupid threatening statement on an e mail three years ago", which he regretted having done soon after .  He states " I thought my son would have forgiven me , but apparently he hasn't yet.". Denies having made any recent threats , and denies any thoughts of violence towards son or anyone else .  He is tolerating medications well and does not endorse medication side effects. He continues to deny any current  suicidal ideations .  No disruptive behaviors on unit, still isolative .  Appreciate  hospitalist  Consultation.   Principal Problem: MDD (major depressive disorder), recurrent episode, moderate ([place]) Diagnosis:   Patient Active Problem List   Diagnosis Date Noted  . MDD (major depressive disorder), recurrent episode, moderate ([place]) [F33.1] 03/27/2015  . Major depressive disorder, recurrent episode, moderate ([place]) [F33.1] 03/25/2015  . Midsternal chest pain [R07.2] 03/23/2015  . BPH (benign prostatic hypertrophy) [N40.0] 03/23/2015  . Homelessness [Z59.0] 03/23/2015  . Elevated troponin [R79.89] 03/23/2015  . Hypokalemia [E87.6] 03/22/2015  . Hydronephrosis [N13.30] 03/22/2015  . Urinary retention due to benign prostatic hyperplasia [N28.89, R33.8] 03/22/2015  . Unstable angina ([place]) [I20.0] 03/19/2015  . Chest pain [R07.9] 03/19/2015  . Abnormal nuclear cardiac imaging test [R93.1] 03/19/2015  . OBESITY, CLASS III [E66.01] 09/04/2008  . EDEMA [R60.9] 08/10/2008  . DERMATOPHYTOSIS OF GROIN AND PERIANAL AREA [B35.6] 12/28/2007  . CAD S/P percutaneous coronary angioplasty - h/p PCI to RCA; Moderate LAD lesion with FFR 0.85 (med Rx) [I25.10, Z98.61] 10/06/2007  . Dyslipidemia [E78.5] 03/29/2007  . DEPRESSION/ANXIETY [F34.1] 03/29/2007  . Essential hypertension [I10] 03/29/2007  . GASTROESOPHAGEAL REFLUX DISEASE [K21.9] 03/29/2007  . ALLERGY [T78.40XA] 03/29/2007  . PROSTATITIS, HX OF [Z87.448] 03/29/2007  . 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 Edema   . Dermatophytosis of groin and perianal area   . CAD (coronary artery disease)  cath '06, repeat '08 non-obstructive disease  . GERD (gastroesophageal reflux disease)   . Hyperlipidemia   . Allergy   . History of prostatitis   . History of BPH   . Hypertension   . Anxiety   . Depression   . Myocardial infarction ~ 08/2014    Past Surgical History  Procedure Laterality Date  .  Tonsillectomy and adenoidectomy    . Coronary angioplasty with stent placement  ~ 08/2014    "1 stent"  . Cardiac catheterization  05/07/2005   . Cardiac catheterization N/A 03/20/2015    Procedure: Left Heart Cath and Coronary Angiography;  Surgeon: Belva Crome, MD;  Location: [facility];  Service: Cardiovascular;  Laterality: N/A;   Family History:  Family History  Problem Relation Age of Onset  . Stroke Mother   . Coronary artery disease Father   . Heart attack Father   . Cancer Neg Hx     colon or prostate cancer   Social History:  History  Alcohol Use  . Yes    Comment: ocassionally     History  Drug Use No    Social History   Social History  . Marital Status: Divorced    Spouse Name: N/A  . Number of Children: N/A  . Years of Education: N/A   Social History Main Topics  . Smoking status: Former Smoker    Types: Cigars  . Smokeless tobacco: Never Used     Comment: quit smoking in 2010  . Alcohol Use: Yes     Comment: ocassionally  . Drug Use: No  . Sexual Activity:    Partners: Female   Other Topics Concern  . 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 acetaminophen (TYLENOL) tablet 650 mg  650 mg Oral Q6H PRN Laverle Hobby, PA-C   650 mg at 04/02/15 1211  . alum & mag hydroxide-simeth (MAALOX/MYLANTA) 200-200-20 MG/5ML suspension 30 mL  30 mL Oral Q4H PRN Laverle Hobby, PA-C      . aspirin EC tablet 81 mg  81 mg Oral Daily Laverle Hobby, PA-C   81 mg at 04/03/15 0849  . atorvastatin (LIPITOR) tablet 40 mg  40 mg Oral Daily Laverle Hobby, PA-C   40 mg at 04/03/15 0850  . busPIRone (BUSPAR) tablet  15 mg  15 mg Oral TID Jenne Campus, MD   15 mg at 04/03/15 1226  . clopidogrel (PLAVIX) tablet 75 mg  75 mg Oral Daily Laverle Hobby, PA-C   75 mg at 04/03/15 0849  . DULoxetine (CYMBALTA) DR capsule 90 mg  90 mg Oral Daily Jenne Campus, MD   90 mg at 04/03/15 0851  . feeding supplement (ENSURE ENLIVE) (ENSURE ENLIVE) liquid 237 mL  237 mL Oral BID BM Myer Peer Kaleel Schmieder, MD   237 mL at 04/03/15 1039  . finasteride (PROSCAR) tablet 5 mg  5 mg Oral Daily Laverle Hobby, PA-C   5 mg at 04/03/15 0850  . isosorbide mononitrate (IMDUR) 24 hr tablet 30 mg  30 mg Oral Daily Laverle Hobby, PA-C   30 mg at 04/03/15 0850  . magnesium  hydroxide (MILK OF MAGNESIA) suspension 30 mL  30 mL Oral Daily PRN Laverle Hobby, PA-C      . metoprolol tartrate (LOPRESSOR) tablet 75 mg  75 mg Oral BID Benjamine Mola, FNP   75 mg at 04/03/15 0850  . nitroGLYCERIN (NITROSTAT) SL tablet 0.4 mg  0.4 mg Sublingual Q5 min PRN Laverle Hobby, PA-C      . pantoprazole (PROTONIX) EC tablet 40 mg  40 mg Oral Q1200 Laverle Hobby, PA-C   40 mg at 04/03/15 1226  . potassium chloride SA (K-DUR,KLOR-CON) CR tablet 20 mEq  20 mEq Oral Daily Laverle Hobby, PA-C   20 mEq at 04/03/15 0849  . tamsulosin (FLOMAX) capsule 0.8 mg  0.8 mg Oral Daily Benjamine Mola, FNP   0.8 mg at 04/03/15 0849  . traZODone (DESYREL) tablet 25 mg  25 mg Oral QHS,MR X 1 Jenne Campus, MD   25 mg at 04/02/15 2110    Lab Results:  No results found for this or any previous visit (from the past 48 hour(s)).  Physical Findings: AIMS: Facial and Oral Movements Muscles of Facial Expression: None, normal Lips and Perioral Area: None, normal Jaw: None, normal Tongue: None, normal,Extremity Movements Upper (arms, wrists, hands, fingers): None, normal Lower (legs, knees, ankles, toes): None, normal, Trunk Movements Neck, shoulders, hips: None, normal, Overall Severity Severity of abnormal movements (highest score from questions above): None,  normal Incapacitation due to abnormal movements: None, normal Patient's awareness of abnormal movements (rate only patient's report): No Awareness, Dental Status Current problems with teeth and/or dentures?: No Does patient usually wear dentures?: No  CIWA:  CIWA-Ar Total: 1 COWS:  COWS Total Score: 1  Musculoskeletal: Strength & Muscle Tone: within normal limits Gait & Station: normal Patient leans: N/A  Psychiatric Specialty Exam: ROS  Voiding spontaneously, ongoing incontinence   Blood pressure 169/86, pulse 90, temperature 97.6 F (36.4 C), temperature source Oral, resp. rate 18, height 5' 5.25" (1.657 m), weight 198 lb (89.812 kg).Body mass index is 32.71 kg/(m^2).  General Appearance: slightly improved grooming  Eye Contact::  Good  Speech:  Normal Rate  Volume:  Normal  Mood:    Still depressed but some improvement compared to admission  Affect:    Anxious, ruminative, particularly about disposition planning and going to a Group Home/ALF setting  Thought Process:  Linear  Orientation:  Other:  fully alert and attentive   Thought Content:  no hallucinations, no delusions, not internally preoccupied   Suicidal Thoughts:  No- denies any current SI or any current self injurious ideations- contracts for safety  Homicidal Thoughts:  No  Memory:  recent and remote grossly intact   Judgement:  Other:  improving  Insight:  improving   Psychomotor Activity:   Decreased- still tends to isolate in room  Concentration:  Good  Recall:  Good  Fund of Knowledge:Good  Language: Good  Akathisia:  Negative  Handed:  Right  AIMS (if indicated):     Assets:  Desire for Improvement Resilience  ADL's:  Fair   Cognition: WNL  Sleep:  Number of Hours: 6.75  Assessment -  Patient   Still presents depressed, sad , but affect partially improved compared to admission. He is not suicidal. He is anxious and ruminative about going to an ALF/Group home, but responds well to support, encouragement ,  and states he is aware that this may be an optimal disposition plan for him at this time. Although depressed, he  is becoming a little more future oriented, and spoke about plans to go to work on a part time basis in the near future . Tolerating medications well .  Treatment Plan Summary: Daily contact with patient to assess and evaluate symptoms and progress in treatment, Medication management, Plan inpatient treatment and medications as below  Continue  Cymbalta   90  mgrs QDAY for depression and anxiety Continue Buspar   15  mgrs TID for  Ongoing symptoms of anxiety Continue Flomax for urinary symptoms/ BPH Continue Trazodone 25 mgrs QHS PRN insomnia Continue Plavix, Nitroglycerin PRNs to address history of CAD Continue Protonix for GERD  Continue to encourage  Milieu/ group  participation  To work on coping skills  And reduction of symptoms. CSW/Treatment team working on disposition options.  Patient concerned about ALF disposition, but states he is motivated in this option as it is the best for him at present      [place], [place] 04/03/2015, 12:40 PM

## 2015-04-03 NOTE — Progress Notes (Signed)
Pt did not attend wrap up group this evening.  

## 2015-04-03 NOTE — Progress Notes (Signed)
D: Pt was in his room upon initial approach.  Pt has anxious affect and depressed mood.  He reports his day was "so-so."  Pt reports he is "leaving tomorrow to an assisted living place."  Pt reports his goal today was to "resolve the next step."  Pt was referring to finding placement after [facility].  Pt denies SI/HI, denies hallucinations, denies pain.  Pt has been in his room for the majority of the evening.  He did not attend evening group.   A: Introduced self to pt.  Met with pt 1:1 and provided support and encouragement.  Actively listened to pt.  Medications administered per order.   R: Pt is compliant with medication.  Pt verbally contracts for safety.  Will continue to monitor and assess.

## 2015-04-03 NOTE — BHH Group Notes (Signed)
[facility] LCSW Aftercare Discharge Planning Group Note  04/03/2015 8:45 AM   Pt did not attend, declined invitation.   Chad Cordial, LCSWA 04/03/2015 9:45 AM

## 2015-04-03 NOTE — Progress Notes (Signed)
DAR Note: [name] [name] has been in his room most of the shift.  He has not been attending groups.  No interaction with peers.  He reports some passive suicidal thoughts but no plan or intent.  He is able to contract for safety.  He denies A/V hallucinations.  Self inventory completed was not completed because he stated the he doesn't fell well.  "I have been having some panic attacks.  Encouraged him to shower due to poor hygiene.  He states that he is worried about where he is going to go when he is discharged.  Urged him to discuss this with MD and Child psychotherapist.  He denies any pain and appears to be in no physical distress.  Q 15 minute checks maintained for safety.  Encouraged participation in group and unit activities.

## 2015-04-03 NOTE — Telephone Encounter (Signed)
Patient is still in the hospital as of today, 04/03/15.

## 2015-04-04 NOTE — Tx Team (Signed)
Interdisciplinary Treatment Plan Update (Adult) Date: 04/04/2015   Date: 04/04/2015 2:18 PM  Progress in Treatment:  Attending groups: Yes  Participating in groups: Yes  Taking medication as prescribed: Yes  Tolerating medication: Yes  Family/Significant othe contact made: No, CSW attempting to contact daughter; Pt trying to get number Patient understands diagnosis: Yes Discussing patient identified problems/goals with staff: Yes  Medical problems stabilized or resolved: Yes  Denies suicidal/homicidal ideation: Yes Patient has not harmed self or Others: Yes   New problem(s) identified: None identified at this time.   Discharge Plan or Barriers: CSW will assess for appropriate discharge plan and relevant barriers.   04/02/15: Pt will discharge to [facility]F.  Additional comments: n/a   Reason for Continuation of Hospitalization:  Depression Medication stabilization Suicidal ideation  Estimated length of stay: 3-5 days  Review of initial/current patient goals per problem list:   1.  Goal(s): Patient will participate in aftercare plan  Met:  Yes  Target date: 3-5 days from date of admission   As evidenced by: Patient will participate within aftercare plan AEB aftercare provider and housing plan at discharge being identified.  03/28/15: CSW to work with Pt to assess for appropriate discharge plan and faciliate appointments and referrals as needed prior to d/c. 04/02/15: Pt will discharge to [facility] and Follow-up with [facility].  2.  Goal (s): Patient will exhibit decreased depressive symptoms and suicidal ideations.  Met:  Progressing  Target date: 3-5 days from date of admission   As evidenced by: Patient will utilize self rating of depression at 3 or below and demonstrate decreased signs of depression or be deemed stable for discharge by MD. 03/28/15: Pt was admitted with symptoms of depression, rating 10/10. Pt continues to present with flat affect and depressive  symptoms.  Pt will demonstrate decreased symptoms of depression and rate depression at 3/10 or lower prior to discharge. 04/02/15: Pt rates depression at 7/10; denies SI 04/04/15: Pt rates depression lower today; states he feels some better- is feeling more anxious.  Attendees:  Patient:    Family:    Physician: Dr. Parke Poisson, MD  04/04/2015 2:18 PM  Nursing: Lars Pinks, RN Case manager  04/04/2015 2:18 PM  Clinical Social Worker Norman Clay, MSW 04/04/2015 2:18 PM  Other: Lucinda Dell, Beverly Sessions Liasion 04/04/2015 2:18 PM  Clinical: Manuella Ghazi, RN 04/04/2015 2:18 PM  Other: , RN Charge Nurse 04/04/2015 2:18 PM  Other:     Peri Maris, [place] MSW

## 2015-04-04 NOTE — Progress Notes (Signed)
Checked on [name] [name] in his room.  He states that he is feeling some better.  "I still have a little discomfort in my chest but it isn't like it was."  He continues to deny the need for nitroglycerin but again agrees to call if his symptoms worsen.  We will continue to monitor.  He appears to be in no physical distress.  He is laying calmly in his bed.  He is not clammy, no shortness of breath and pulse was regular at 72.  BP 146/83.  We will continue to monitor his condition.  Reminded him to seek out nursing staff if his condition worsens.

## 2015-04-04 NOTE — Progress Notes (Addendum)
DAR Note: [name] [name] reports that he has been having some slight chest pain.  "More like discomfort."  Pain does not radiate towards jaw or arm.  Retook BP 160/88.  He just took his morning medications.  He feels that it may be from anxiety.  Offered PRN Nitroglycerin but he declined stating "It's not that bad."  He was up walking around the room.  He was not clammy, no shortness of breath, and pulse was regular and 76.  He states that he is going to lay for a while and call the nursing staff if his chest pain gets worse.

## 2015-04-04 NOTE — Clinical Social Work Note (Addendum)
Hanley Ben from [facility] willing to assist patient w access to housing options - asks that he contact her to discuss choices and possible housing options.  Asks that patient sign release so she can be informed of patient's discharge destination - she will follow up w him early next week. Contact Moran at [phone] or [email or web address].  Santa Genera, LCSW Clinical Social Worker

## 2015-04-04 NOTE — Progress Notes (Signed)
[name] [name] BP was low prior to his 5pm dose of Metoprolol.  Held dose until after supper.  Recheck of his blood pressure manually was 120/70.  Per his history, this is a low blood pressure for him and he is concerned about taking his nightly dose.  We will continue to hold per patient request and pass on to next shift.

## 2015-04-04 NOTE — Progress Notes (Signed)
D: Pt presents anxious in affect and mood. Pt reports that his lightheadedness is no longer a current issue at this time. Pt's current BP is 134/81 HR 80 sitting and 123/59 HR 82 standing. Pt denies any SI/HI/AVH. Pt did not attend karaoke this evening. Pt joined the other patient's in the dayroom upon their return from [place].   A: Writer administered scheduled medications to pt, per MD orders. Continued support and availability as needed was extended to this pt. Staff continue to monitor pt with q97min checks.  R: No adverse drug reactions noted. Pt receptive to treatment. Pt remains safe at this time.

## 2015-04-04 NOTE — BHH Group Notes (Signed)
[facility] Group Therapy 04/04/2015 1:15pm  Type of Therapy: Mental [facility] Presentation  Pt did not attend, declined invitation.   Chad Cordial, LCSWA 04/04/2015 2:18 PM

## 2015-04-04 NOTE — Progress Notes (Signed)
Patient ID: [name] [name], male   DOB: 1945-12-26, 69 y.o.   MRN: [id number]  Adult Psychoeducational Group Note  Date:  04/04/2015 Time:  09:190am  Group Topic/Focus:  Orientation:   The focus of this group is to educate the patient on the purpose and policies of crisis stabilization and provide a format to answer questions about their admission.  The group details unit policies and expectations of patients while admitted.  Participation Level:  Did Not Attend  Participation Quality: n/a  Affect:  n/a  Cognitive: n/a  Insight: n/a  Engagement in Group:  n/a  Modes of Intervention:  Education, Orientation and Support  Additional Comments:  Pt did not attend group. Pt in bed asleep.   Aurora Mask 04/04/2015, 10:26 AM

## 2015-04-04 NOTE — Progress Notes (Addendum)
DAR Note: [name] [name] has spent much of the day in his room.  He complained of headache 4/10 and tylenol was given prn.  He reports that it finally helped.  He denies any SI/HI or A/V hallucinations.  He denies any chest pain and reports that he feels that it was related to anxiety.  He did not attend groups.  He did get up to eat lunch in the day room.  He was encouraged to get up and move around since he states that he has been feeling really sleepy.  He is planning on trying to eat in the cafeteria for supper.  He has taken his medications without difficulty.  He appears to be in no physical distress.  Q 15 minute checks maintained for safety.  He completed his self inventory and reports that his depression is 8/10, hopelessness 7/10 and anxiety is 9/10.  He states that his goal for today was to work on getting into assisted living facility.  We will continue to encourage participation in group and unit activities.

## 2015-04-04 NOTE — Progress Notes (Signed)
Patient ID: [name] [name], male   DOB: December 21, 1945, 69 y.o.   MRN: [id number] [facility] MD Progress Note  04/04/2015 12:01 PM [name]  MRN:  [id number] Subjective:  Patient  Reports increased depression and in particular anxiety today. He states he had symptoms of a panic attack earlier today, with increased , severe anxiety, and temporary chest discomfort . Patient has a history of CAD , but states that this discomfort was different and related to anxiety, and subsided after a brief period of time. At this time denies any chest pain, and does not present with diaphoresis, pallor. No SOB at room air . Anxiety is related to upcoming discharge from the unit, where patient states he has " felt better". He acknowledges he has a fear of the unknown, and does not know how Group Home will work out for him. However, he states " I realize it is a good option for me at this time, so I am going to go. It is preferable to being homeless. " Patient's incontinence , urinary symptoms have abated to some degree and he feels he is now better able to manage pads, adult diapers better than he had been . He seems less focused and concerned about this issue at present. He does state he plans to follow up with his PCP at [facility], and with his Urologist , Dr. Westley Hummer, after discharge .  Objective : I have discussed case with treatment team  and have met with patient. Patient remains depressed, although better than upon admission, and remains anxious about his discharge, with what he describes as a panic attack earlier today- now subsided and no current chest pain, chest discomfort or any SOB. Although apprehensive, he is amenable to support, reassurance, feedback, and states he has decided to proceed with this discharge option ( going to group home ) .  He denies medication side effects. No disruptive or agitated behaviors on unit, cooperative upon approach.   Principal Problem: MDD (major depressive disorder), recurrent  episode, moderate ([place]) Diagnosis:   Patient Active Problem List   Diagnosis Date Noted  . MDD (major depressive disorder), recurrent episode, moderate ([place]) [F33.1] 03/27/2015  . Major depressive disorder, recurrent episode, moderate ([place]) [F33.1] 03/25/2015  . Midsternal chest pain [R07.2] 03/23/2015  . BPH (benign prostatic hypertrophy) [N40.0] 03/23/2015  . Homelessness [Z59.0] 03/23/2015  . Elevated troponin [R79.89] 03/23/2015  . Hypokalemia [E87.6] 03/22/2015  . Hydronephrosis [N13.30] 03/22/2015  . Urinary retention due to benign prostatic hyperplasia [N28.89, R33.8] 03/22/2015  . Unstable angina ([place]) [I20.0] 03/19/2015  . Chest pain [R07.9] 03/19/2015  . Abnormal nuclear cardiac imaging test [R93.1] 03/19/2015  . OBESITY, CLASS III [E66.01] 09/04/2008  . EDEMA [R60.9] 08/10/2008  . DERMATOPHYTOSIS OF GROIN AND PERIANAL AREA [B35.6] 12/28/2007  . CAD S/P percutaneous coronary angioplasty - h/p PCI to RCA; Moderate LAD lesion with FFR 0.85 (med Rx) [I25.10, Z98.61] 10/06/2007  . Dyslipidemia [E78.5] 03/29/2007  . DEPRESSION/ANXIETY [F34.1] 03/29/2007  . Essential hypertension [I10] 03/29/2007  . GASTROESOPHAGEAL REFLUX DISEASE [K21.9] 03/29/2007  . ALLERGY [T78.40XA] 03/29/2007  . PROSTATITIS, HX OF [Z87.448] 03/29/2007  . 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 Edema   . Dermatophytosis of groin and perianal area   . CAD (coronary artery disease)     cath '06, repeat '08 non-obstructive disease  . GERD (gastroesophageal reflux  disease)   . Hyperlipidemia   . Allergy   . History of prostatitis   . History of BPH   . Hypertension   . Anxiety   . Depression   . Myocardial infarction ~ 08/2014    Past Surgical History  Procedure Laterality Date  . Tonsillectomy and adenoidectomy    . Coronary angioplasty with stent placement  ~ 08/2014     "1 stent"  . Cardiac catheterization  05/07/2005   . Cardiac catheterization N/A 03/20/2015    Procedure: Left Heart Cath and Coronary Angiography;  Surgeon: Belva Crome, MD;  Location: [facility];  Service: Cardiovascular;  Laterality: N/A;   Family History:  Family History  Problem Relation Age of Onset  . Stroke Mother   . Coronary artery disease Father   . Heart attack Father   . Cancer Neg Hx     colon or prostate cancer   Social History:  History  Alcohol Use  . Yes    Comment: ocassionally     History  Drug Use No    Social History   Social History  . Marital Status: Divorced    Spouse Name: N/A  . Number of Children: N/A  . Years of Education: N/A   Social History Main Topics  . Smoking status: Former Smoker    Types: Cigars  . Smokeless tobacco: Never Used     Comment: quit smoking in 2010  . Alcohol Use: Yes     Comment: ocassionally  . Drug Use: No  . Sexual Activity:    Partners: Female   Other Topics Concern  . 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 acetaminophen (TYLENOL) tablet 650 mg  650 mg Oral Q6H PRN SLaverle Hobby PA-C   650 mg at 04/04/15 0932  . alum & mag hydroxide-simeth (MAALOX/MYLANTA) 200-200-20 MG/5ML suspension 30 mL  30 mL Oral Q4H PRN SLaverle Hobby PA-C      . aspirin EC tablet 81 mg  81 mg Oral Daily SLaverle Hobby PA-C   81 mg at 04/04/15 0746  . atorvastatin (LIPITOR) tablet 40 mg  40 mg Oral Daily SLaverle Hobby PA-C   40 mg at 04/04/15 0746  . busPIRone (BUSPAR) tablet 15 mg  15 mg Oral TID FJenne Campus MD   15 mg at 04/04/15 0744  . clopidogrel  (PLAVIX) tablet 75 mg  75 mg Oral Daily SLaverle Hobby PA-C   75 mg at 04/04/15 0746  . DULoxetine (CYMBALTA) DR capsule 90 mg  90 mg Oral Daily FJenne Campus MD   90 mg at 04/04/15 0744  . feeding supplement (ENSURE ENLIVE) (ENSURE ENLIVE) liquid 237 mL  237 mL Oral BID BM FMyer PeerCobos, MD   237 mL at 04/04/15 0934  . finasteride (PROSCAR) tablet 5 mg  5 mg Oral Daily SLaverle Hobby PA-C   5 mg at 04/04/15 0746  . isosorbide mononitrate (IMDUR) 24 hr tablet 30 mg  30 mg Oral Daily SLaverle Hobby PA-C   30 mg at 04/04/15 0746  . magnesium hydroxide (MILK OF MAGNESIA) suspension 30 mL  30 mL Oral  Daily PRN Laverle Hobby, PA-C      . metoprolol tartrate (LOPRESSOR) tablet 75 mg  75 mg Oral BID Benjamine Mola, FNP   75 mg at 04/04/15 0746  . nitroGLYCERIN (NITROSTAT) SL tablet 0.4 mg  0.4 mg Sublingual Q5 min PRN Laverle Hobby, PA-C      . pantoprazole (PROTONIX) EC tablet 40 mg  40 mg Oral Q1200 Laverle Hobby, PA-C   40 mg at 04/03/15 1226  . potassium chloride SA (K-DUR,KLOR-CON) CR tablet 20 mEq  20 mEq Oral Daily Laverle Hobby, PA-C   20 mEq at 04/04/15 0745  . tamsulosin (FLOMAX) capsule 0.8 mg  0.8 mg Oral Daily Benjamine Mola, FNP   0.8 mg at 04/04/15 0745  . traZODone (DESYREL) tablet 25 mg  25 mg Oral QHS,MR X 1 Jenne Campus, MD   25 mg at 04/03/15 2106    Lab Results:  No results found for this or any previous visit (from the past 48 hour(s)).  Physical Findings: AIMS: Facial and Oral Movements Muscles of Facial Expression: None, normal Lips and Perioral Area: None, normal Jaw: None, normal Tongue: None, normal,Extremity Movements Upper (arms, wrists, hands, fingers): None, normal Lower (legs, knees, ankles, toes): None, normal, Trunk Movements Neck, shoulders, hips: None, normal, Overall Severity Severity of abnormal movements (highest score from questions above): None, normal Incapacitation due to abnormal movements: None, normal Patient's awareness of  abnormal movements (rate only patient's report): No Awareness, Dental Status Current problems with teeth and/or dentures?: No Does patient usually wear dentures?: No  CIWA:  CIWA-Ar Total: 1 COWS:  COWS Total Score: 1  Musculoskeletal: Strength & Muscle Tone: within normal limits Gait & Station: normal Patient leans: N/A  Psychiatric Specialty Exam: ROS  Voiding spontaneously, ongoing incontinence   Blood pressure 143/83, pulse 79, temperature 98 F (36.7 C), temperature source Oral, resp. rate 20, height 5' 5.25" (1.657 m), weight 198 lb (89.812 kg).Body mass index is 32.71 kg/(m^2).  General Appearance:  Fairly groomed   Engineer, water::  Good  Speech:  Normal Rate  Volume:  Normal  Mood:    Depressed, although better than upon admission  Affect:     Less constricted but more anxious , particularly ruminative about how  Group Home will work out for him.  Thought Process:  Linear  Orientation:  Other:  fully alert and attentive   Thought Content:  no hallucinations, no delusions, not internally preoccupied   Suicidal Thoughts:  No- denies any current SI or any current self injurious ideations- contracts for safety  Homicidal Thoughts:  No  Memory:  recent and remote grossly intact   Judgement:  Other:  improving  Insight:  improving   Psychomotor Activity:   Decreased- still tends to isolate in room  Concentration:  Good  Recall:  Good  Fund of Knowledge:Good  Language: Good  Akathisia:  Negative  Handed:  Right  AIMS (if indicated):     Assets:  Desire for Improvement Resilience  ADL's:  Fair   Cognition: WNL  Sleep:  Number of Hours: 6.75  Assessment -   Patient is improving gradually, partially. Mood does seem to be improving gradually. Anxiety is ongoing , and at this time relates mostly to feeling apprehensive about  Discharge and about what Group Home living experience will be . He is not suicidal. His urinary symptoms persist, but has been able to void spontaneously.   Tolerating medications well .  Treatment Plan Summary: Daily  contact with patient to assess and evaluate symptoms and progress in treatment, Medication management, Plan inpatient treatment and medications as below  Continue  Cymbalta   90  mgrs QDAY for depression and anxiety Continue Buspar   15  mgrs TID for  Ongoing symptoms of anxiety Continue Flomax for urinary symptoms/ BPH Continue Trazodone 25 mgrs QHS PRN insomnia Continue Plavix, Nitroglycerin PRNs to address history of CAD Continue Protonix for GERD  Patient /CSW working on disposition options- likely group home bed available today or tomorrow- and patient agreeing with this disposition option. Patient/ team working to set up new appointments at [facility], and with Urologist , for ongoing outpatient treatment , monitoring . Continue to encourage  Milieu/ group  participation  To work on coping skills  And reduction of symptoms. Likely discharge tomorrow if patient continues to improve. Patient agrees to immediately contact staff if any further episodes of chest pain should ensue .     COBOS, [place] 04/04/2015, 12:01 PM

## 2015-04-05 MED ORDER — PANTOPRAZOLE SODIUM 40 MG PO TBEC: 40.0000 mg | DELAYED_RELEASE_TABLET | Freq: Every day | ORAL | Status: AC

## 2015-04-05 MED ORDER — ASPIRIN 81 MG PO TBEC: 81.0000 mg | DELAYED_RELEASE_TABLET | Freq: Every day | ORAL | Status: AC

## 2015-04-05 MED ORDER — CLOPIDOGREL BISULFATE 75 MG PO TABS: 75.0000 mg | ORAL_TABLET | Freq: Every day | ORAL | Status: DC

## 2015-04-05 MED ORDER — ATORVASTATIN CALCIUM 40 MG PO TABS
40.0000 mg | ORAL_TABLET | Freq: Every day | ORAL | Status: DC
Start: 2015-04-05 — End: 2015-04-16

## 2015-04-05 MED ORDER — POTASSIUM CHLORIDE CRYS ER 20 MEQ PO TBCR: 20.0000 meq | EXTENDED_RELEASE_TABLET | Freq: Every day | ORAL | Status: DC

## 2015-04-05 MED ORDER — BUSPIRONE HCL 15 MG PO TABS: 15.0000 mg | ORAL_TABLET | Freq: Three times a day (TID) | ORAL | Status: DC

## 2015-04-05 MED ORDER — NITROGLYCERIN 0.4 MG SL SUBL: 0.4000 mg | SUBLINGUAL_TABLET | SUBLINGUAL | Status: AC | PRN

## 2015-04-05 MED ORDER — ISOSORBIDE MONONITRATE ER 30 MG PO TB24: 30.0000 mg | ORAL_TABLET | Freq: Every day | ORAL | Status: DC

## 2015-04-05 MED ORDER — TRAZODONE HCL 50 MG PO TABS: 25.0000 mg | ORAL_TABLET | Freq: Every evening | ORAL | Status: DC | PRN

## 2015-04-05 MED ORDER — METOPROLOL TARTRATE 75 MG PO TABS: 75.0000 mg | ORAL_TABLET | Freq: Two times a day (BID) | ORAL | Status: DC

## 2015-04-05 MED ORDER — TAMSULOSIN HCL 0.4 MG PO CAPS: 0.8000 mg | ORAL_CAPSULE | Freq: Every day | ORAL | Status: DC

## 2015-04-05 MED ORDER — DULOXETINE HCL 30 MG PO CPEP: 90.0000 mg | ORAL_CAPSULE | Freq: Every day | ORAL | Status: DC

## 2015-04-05 MED ORDER — FINASTERIDE 5 MG PO TABS: 5.0000 mg | ORAL_TABLET | Freq: Every day | ORAL | Status: DC

## 2015-04-05 NOTE — BHH Suicide Risk Assessment (Signed)
[facility] Discharge Suicide Risk Assessment   Demographic Factors:  69 year old male, retired, divorced, currently homeless   Total Time spent with patient: 30 minutes  Musculoskeletal: Strength & Muscle Tone: within normal limits Gait & Station: normal Patient leans: N/A  Psychiatric Specialty Exam: Physical Exam  ROS  Blood pressure 148/82, pulse 95, temperature 98 F (36.7 C), temperature source Oral, resp. rate 16, height 5' 5.25" (1.657 m), weight 198 lb (89.812 kg).Body mass index is 32.71 kg/(m^2).  General Appearance: Fairly Groomed  Patent attorney::  Good  Speech:  Normal Rate409  Volume:  Normal  Mood:  less depressed , but still anxious   Affect:  remains anxious, less severely constricted   Thought Process:  Linear  Orientation:  Full (Time, Place, and Person)  Thought Content:  denies hallucinations, no delusions, not internally preoccupied, anxious and ruminative about  transitioning to Group Home setting  Suicidal Thoughts:  No- at this time denies any suicidal ideations, denies any self injurious ideations, denies any thoughts of violence   Homicidal Thoughts:  No  Memory:  recent and remote grossly intact   Judgement:  Other:  improved   Insight:  improved   Psychomotor Activity:  Normal  Concentration:  Good  Recall:  Good  Fund of Knowledge:Good  Language: Negative  Akathisia:  Negative  Handed:  Right  AIMS (if indicated):     Assets:  Communication Skills Desire for Improvement Resilience  Sleep:  Number of Hours: 6.75  Cognition: WNL  ADL's:  Improving    Have you used any form of tobacco in the last 30 days? (Cigarettes, Smokeless Tobacco, Cigars, and/or Pipes): Yes  Has this patient used any form of tobacco in the last 30 days? (Cigarettes, Smokeless Tobacco, Cigars, and/or Pipes) No  Mental Status Per Nursing Assessment::   On Admission:  Self-harm thoughts  Current Mental Status by Physician: At this time patient is alert, attentive, improved  grooming compared to admission, affect less constricted, but anxious as he approaches discharge and goes to Group Home living environment. Mood less depressed . No thought disorder, no SI , no self injurious ideations,no HI, also specifically denies any thoughts of violence towards family, son, no  psychotic symptoms, oriented x 3.  Although patient ruminative about going to Group Home, he states " I know it is the best option for me now ", and expresses desire to continue with this treatment disposition plan.  Loss Factors: Poor support system, homeless, urinary symptoms - incontinence .   Historical Factors: history of depression  Risk Reduction Factors:   Living with another person, especially a relative and Positive coping skills or problem solving skills  Continued Clinical Symptoms:  Patient still depressed, but improved compared to admission , affect anxious as he approaches discharge, and admits to apprehension about new environment and what it may be like when he gets there ( going to Group Home setting). He does respond to support,  Empathy, and anxiety decreased partially during session.  Voiding spontaneously and does not report dysuria, chills, fever. Ongoing incontinence of urine, partially improved with flomax, which he states he has tolerated well thus far .  Cognitive Features That Contribute To Risk:  No gross cognitive deficits noted upon discharge. Is alert , attentive, and oriented x 3   Suicide Risk:  Mild:  Suicidal ideation of limited frequency, intensity, duration, and specificity.  There are no identifiable plans, no associated intent, mild dysphoria and related symptoms, good self-control (both objective and  subjective assessment), few other risk factors, and identifiable protective factors, including available and accessible social support.  Principal Problem: MDD (major depressive disorder), recurrent episode, moderate ([facility]) Discharge Diagnoses:  Patient Active  Problem List   Diagnosis Date Noted  . MDD (major depressive disorder), recurrent episode, moderate ([facility]) [F33.1] 03/27/2015  . Major depressive disorder, recurrent episode, moderate ([facility]) [F33.1] 03/25/2015  . Midsternal chest pain [R07.2] 03/23/2015  . BPH (benign prostatic hypertrophy) [N40.0] 03/23/2015  . Homelessness [Z59.0] 03/23/2015  . Elevated troponin [R79.89] 03/23/2015  . Hypokalemia [E87.6] 03/22/2015  . Hydronephrosis [N13.30] 03/22/2015  . Urinary retention due to benign prostatic hyperplasia [N28.89, R33.8] 03/22/2015  . Unstable angina ([facility]) [I20.0] 03/19/2015  . Chest pain [R07.9] 03/19/2015  . Abnormal nuclear cardiac imaging test [R93.1] 03/19/2015  . OBESITY, CLASS III [E66.01] 09/04/2008  . EDEMA [R60.9] 08/10/2008  . DERMATOPHYTOSIS OF GROIN AND PERIANAL AREA [B35.6] 12/28/2007  . CAD S/P percutaneous coronary angioplasty - h/p PCI to RCA; Moderate LAD lesion with FFR 0.85 (med Rx) [I25.10, Z98.61] 10/06/2007  . Dyslipidemia [E78.5] 03/29/2007  . DEPRESSION/ANXIETY [F34.1] 03/29/2007  . Essential hypertension [I10] 03/29/2007  . GASTROESOPHAGEAL REFLUX DISEASE [K21.9] 03/29/2007  . ALLERGY [T78.40XA] 03/29/2007  . PROSTATITIS, HX OF [Z87.448] 03/29/2007  . 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   Specialty:  Internal Medicine   Why:  at 9:00am with Marcos Eke. Please arrive 15 minutes early to complete paperwork. This appointment will also serve as a medication management appointment for your psychiatric meds until your appointment with Dr. Lolly Mustache at  information:   [address] [place] [address] [phone]      Follow up with [facility] On 05/14/2015.   Specialty:  Behavioral Health   Why:  at 10:15am with Dr. Lolly Mustache for medication management.    Contact information:   [address] [place] [address] [phone]      Call [facility].   Why:  Call Hanley Ben at [phone] ext [phone]. She was given your contact information and will be calling you next week to discuss options   Contact information:   [address], [place], [place] [address] Phone: [phone]      Plan Of Care/Follow-up recommendations:  Activity:  as tolerated  Diet:  Heart Healthy Tests:  NA Other:  See below  Is patient on multiple antipsychotic therapies at discharge:  No   Has Patient had three or more failed trials of antipsychotic monotherapy by history:  No  Recommended Plan for Multiple Antipsychotic Therapies: NA  Patient going to  Group Home Setting after discharge. Plans to follow up as above . Encouraged compliance with PCP appointment as above , also, patient has seen outpatient urologist in the past, for BPH issues, and states he plans to make a follow up appointment soon.   Janica Eldred 04/05/2015, 2:04 PM

## 2015-04-05 NOTE — B[facility] Suicide Risk Assessment (Signed)
B[facility] INPATIENT:  Family/Significant Other Suicide Prevention Education  Suicide Prevention Education:  Contact Attempts: [name] [name], Pt's son has been identified by the patient as the family member/significant other with whom the patient will be residing, and identified as the person(s) who will aid the patient in the event of a mental health crisis.  With written consent from the patient, two attempts were made to provide suicide prevention education, prior to and/or following the patient's discharge.  We were unsuccessful in providing suicide prevention education.  Only number that Pt provided was a work number and when contacted on day of discharge, place of employment informed CSW that Pt's son was not at work today. No personal contact information could be given. SPE reviewed with patient and brochure provided. Patient encouraged to return to hospital if having suicidal thoughts, patient verbalized his/her understanding and has no further questions at this time.  Elaina Hoops 04/05/2015, 10:44 AM

## 2015-04-05 NOTE — BHH Group Notes (Signed)
[facility] LCSW Aftercare Discharge Planning Group Note  04/05/2015 8:45 AM  Pt did not attend, declined invitation.   Chad Cordial, LCSWA 04/05/2015 9:42 AM

## 2015-04-05 NOTE — Discharge Summary (Signed)
Physician Discharge Summary Note  Patient:  [name] [name] is an 69 y.o., male MRN:  [id number] DOB:  [birth year] Patient phone:  [phone] (home)  Patient address:   [address] [place] [address],  Total Time spent with patient: 30 minutes  Date of Admission:  03/27/2015 Date of Discharge: 04/05/2015  Reason for Admission:  Depression  Principal Problem: MDD (major depressive disorder), recurrent episode, moderate (HCC) Discharge Diagnoses: Patient Active Problem List   Diagnosis Date Noted  . MDD (major depressive disorder), recurrent episode, moderate (HCC) [F33.1] 03/27/2015  . Major depressive disorder, recurrent episode, moderate (HCC) [F33.1] 03/25/2015  . Midsternal chest pain [R07.2] 03/23/2015  . BPH (benign prostatic hypertrophy) [N40.0] 03/23/2015  . Homelessness [Z59.0] 03/23/2015  . Elevated troponin [R79.89] 03/23/2015  . Hypokalemia [E87.6] 03/22/2015  . Hydronephrosis [N13.30] 03/22/2015  . Urinary retention due to benign prostatic hyperplasia [N28.89, R33.8] 03/22/2015  . Unstable angina (HCC) [I20.0] 03/19/2015  . Chest pain [R07.9] 03/19/2015  . Abnormal nuclear cardiac imaging test [R93.1] 03/19/2015  . OBESITY, CLASS III [E66.01] 09/04/2008  . EDEMA [R60.9] 08/10/2008  . DERMATOPHYTOSIS OF GROIN AND PERIANAL AREA [B35.6] 12/28/2007  . CAD S/P percutaneous coronary angioplasty - h/p PCI to RCA; Moderate LAD lesion with FFR 0.85 (med Rx) [I25.10, Z98.61] 10/06/2007  . Dyslipidemia [E78.5] 03/29/2007  . DEPRESSION/ANXIETY [F34.1] 03/29/2007  . Essential hypertension [I10] 03/29/2007  . GASTROESOPHAGEAL REFLUX DISEASE [K21.9] 03/29/2007  . ALLERGY [T78.40XA] 03/29/2007  . PROSTATITIS, HX OF [Z87.448] 03/29/2007  . 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   Review of Systems  All other systems reviewed and are negative.   Blood pressure 148/82, pulse 95, temperature 98 F (36.7 C), temperature source Oral, resp. rate 16, height 5' 5.25" (1.657 m), weight 89.812 kg (198 lb).Body mass index is 32.71 kg/(m^2).  Have you used any form of tobacco in the last 30 days? (Cigarettes, Smokeless Tobacco, Cigars, and/or Pipes): Yes  Has this patient used any form of tobacco in the last 30 days? (Cigarettes, Smokeless Tobacco, Cigars, and/or Pipes) N/A  Past Medical History:  Past Medical History  Diagnosis Date  . Edema   . Dermatophytosis of groin and perianal area   . CAD (coronary artery disease)     cath '06, repeat '08 non-obstructive disease  . GERD (gastroesophageal reflux disease)   . Hyperlipidemia   . Allergy   . History of prostatitis   . History of BPH   . Hypertension   . Anxiety   . Depression   . Myocardial infarction ~ 08/2014    Past Surgical History  Procedure Laterality Date  . Tonsillectomy and adenoidectomy    . Coronary angioplasty with stent placement  ~ 08/2014    "1 stent"  . Cardiac catheterization  05/07/2005   . Cardiac catheterization N/A 03/20/2015    Procedure: Left Heart Cath and Coronary Angiography;  Surgeon: Lyn Records, MD;  Location: [facility] INVASIVE CV LAB;  Service: Cardiovascular;  Laterality: N/A;   Family History:  Family History  Problem Relation Age of Onset  . Stroke Mother   . Coronary artery disease Father   . Heart attack Father   . Cancer Neg Hx     colon or prostate cancer   Social History:  History  Alcohol Use  . Yes  Comment: ocassionally     History  Drug Use No    Social History   Social History  . Marital Status: Divorced    Spouse Name: N/A  . Number of Children: N/A  . Years of Education: N/A   Social History Main Topics  . Smoking status: Former Smoker    Types: Cigars  . Smokeless tobacco: Never Used     Comment: quit smoking in 2010  . Alcohol  Use: Yes     Comment: ocassionally  . Drug Use: No  . Sexual Activity:    Partners: Female   Other Topics Concern  . None   Social History Narrative   Retired Publishing copy- Aeronautical engineer; remains professionally active   Married 56yrs- divorce- in process (3/10), lives alone   Children-Son   Risk to Self: Is patient at risk for suicide?: Yes What has been your use of drugs/alcohol within the last 12 months?: was a social drinker, "2 glasses of red wine/day" Risk to Others:   Prior Inpatient Therapy:   Prior Outpatient Therapy:    Level of Care:  OP  Hospital Course:  [name] [name] was admitted for MDD (major depressive disorder), recurrent episode, moderate (HCC) and crisis management.  He was treated discharged with the medications listed below under Medication List.  Medical problems were identified and treated as needed.  Home medications were restarted as appropriate.  Improvement was monitored by observation and [name] [name] daily report of symptom reduction.  Emotional and mental status was monitored by daily self-inventory reports completed by [name] [name] and clinical staff.         [name] [name] was evaluated by the treatment team for stability and plans for continued recovery upon discharge.  [name] [name] motivation was an integral factor for scheduling further treatment.  Employment, transportation, bed availability, health status, family support, and any pending legal issues were also considered during his hospital stay.  He was offered further treatment options upon discharge including but not limited to Residential, Intensive Outpatient, and Outpatient treatment.  [name] [name] will follow up with the services as listed below under Follow Up Information.     Upon completion of this admission the patient was both mentally and medically stable for discharge denying suicidal/homicidal ideation, auditory/visual/tactile hallucinations, delusional thoughts and paranoia.      Consults:   psychiatry  Significant Diagnostic Studies:  labs: per ED  Discharge Vitals:   Blood pressure 148/82, pulse 95, temperature 98 F (36.7 C), temperature source Oral, resp. rate 16, height 5' 5.25" (1.657 m), weight 89.812 kg (198 lb). Body mass index is 32.71 kg/(m^2). Lab Results:   No results found for this or any previous visit (from the past 72 hour(s)).  Physical Findings: AIMS: Facial and Oral Movements Muscles of Facial Expression: None, normal Lips and Perioral Area: None, normal Jaw: None, normal Tongue: None, normal,Extremity Movements Upper (arms, wrists, hands, fingers): None, normal Lower (legs, knees, ankles, toes): None, normal, Trunk Movements Neck, shoulders, hips: None, normal, Overall Severity Severity of abnormal movements (highest score from questions above): None, normal Incapacitation due to abnormal movements: None, normal Patient's awareness of abnormal movements (rate only patient's report): No Awareness, Dental Status Current problems with teeth and/or dentures?: No Does patient usually wear dentures?: No  CIWA:  CIWA-Ar Total: 1 COWS:  COWS Total Score: 1   See Psychiatric Specialty Exam and Suicide Risk Assessment completed by Attending Physician prior to discharge.  Discharge destination:  Home  Is patient on multiple antipsychotic  therapies at discharge:  No   Has Patient had three or more failed trials of antipsychotic monotherapy by history:  No    Recommended Plan for Multiple Antipsychotic Therapies: NA     Medication List    STOP taking these medications        ALPRAZolam 0.5 MG tablet  Commonly known as:  XANAX     ALPRAZolam 2 MG 24 hr tablet  Commonly known as:  ALPRAZOLAM XR     LEVITRA 20 MG tablet  Generic drug:  vardenafil      TAKE these medications      Indication   aspirin 81 MG EC tablet  Take 1 tablet (81 mg total) by mouth daily.   Indication:  cardiac health     atorvastatin 40 MG tablet  Commonly known  as:  LIPITOR  Take 1 tablet (40 mg total) by mouth daily.   Indication:  high cholesterol     busPIRone 15 MG tablet  Commonly known as:  BUSPAR  Take 1 tablet (15 mg total) by mouth 3 (three) times daily.   Indication:  Anxiety Disorder     clopidogrel 75 MG tablet  Commonly known as:  PLAVIX  Take 1 tablet (75 mg total) by mouth daily.   Indication:  Heart Attack     DULoxetine 30 MG capsule  Commonly known as:  CYMBALTA  Take 3 capsules (90 mg total) by mouth daily.   Indication:  Major Depressive Disorder     finasteride 5 MG tablet  Commonly known as:  PROSCAR  Take 1 tablet (5 mg total) by mouth daily.   Indication:  Enlarged Prostate     isosorbide mononitrate 30 MG 24 hr tablet  Commonly known as:  IMDUR  Take 1 tablet (30 mg total) by mouth daily.   Indication:  Hypertension     Metoprolol Tartrate 75 MG Tabs  Take 75 mg by mouth 2 (two) times daily.   Indication:  High Blood Pressure     nitroGLYCERIN 0.4 MG SL tablet  Commonly known as:  NITROSTAT  Place 1 tablet (0.4 mg total) under the tongue every 5 (five) minutes as needed for chest pain.   Indication:  Acute Angina Pectoris     pantoprazole 40 MG tablet  Commonly known as:  PROTONIX  Take 1 tablet (40 mg total) by mouth daily at 12 noon.   Indication:  Gastroesophageal Reflux Disease     potassium chloride SA 20 MEQ tablet  Commonly known as:  K-DUR,KLOR-CON  Take 1 tablet (20 mEq total) by mouth daily.   Indication:  Low Amount of Potassium in the Blood     tamsulosin 0.4 MG Caps capsule  Commonly known as:  FLOMAX  Take 2 capsules (0.8 mg total) by mouth daily.   Indication:  Enlarged Prostate     traZODone 50 MG tablet  Commonly known as:  DESYREL  Take 0.5 tablets (25 mg total) by mouth at bedtime and may repeat dose one time if needed.   Indication:  Trouble Sleeping       Follow-up Information    Follow up with [facility] On 04/17/2015.   Specialty:   Internal Medicine   Why:  at 9:00am with Marcos Eke. Please arrive 15 minutes early to complete paperwork. This appointment will also serve as a medication management appointment for your psychiatric meds until your appointment with Dr. Lolly Mustache at [facility] information:   [address]  [place] [address] [phone]      Follow up with [facility] On 05/14/2015.   Specialty:  Behavioral Health   Why:  at 10:15am with Dr. Lolly Mustache for medication management.   Contact information:   [address] [place] [address] [phone]      Follow-up recommendations:  Activity:  as tol Diet:  as tol  Comments:  1.  Take all your medications as prescribed.              2.  Report any adverse side effects to outpatient provider.                       3.  Patient instructed to not use alcohol or illegal drugs while on prescription medicines.            4.  In the event of worsening symptoms, instructed patient to call 911, the crisis hotline or go to nearest emergency room for evaluation of symptoms.  Total Discharge Time: 30 min  Signed: Velna Hatchet May Agustin AGNP-BC 04/05/2015, 10:25 AM   Patient seen, Suicide Assessment Completed.  Disposition Plan Reviewed

## 2015-04-05 NOTE — Progress Notes (Signed)
Patient ID: [name] [name], male   DOB: 1945-08-20, 69 y.o.   MRN: [id number] D: Client reports anxiety "5" of 10, notes that some of the anxiety is r/t discharge "we agreed that's where I should go" "the kids not helping, and you worry about it when you care"  Client is pleasant and notes he will be leaving on Monday. A: Writer is supportive, encouraged client to see if maybe he can get in touch with children. "I'll keep trying, I mean someone has to get the communication going" Staff will monitor q61min for safety. R: Client is safe on the unit, attended group.

## 2015-04-05 NOTE — Progress Notes (Signed)
Pt stated that he was suppose to leave today, but will be staying her until Monday.

## 2015-04-05 NOTE — Tx Team (Addendum)
Interdisciplinary Treatment Plan Update (Adult) Date: 04/05/2015   Date: 04/05/2015 1:03 PM  Progress in Treatment:  Attending groups: Yes  Participating in groups: Yes  Taking medication as prescribed: Yes  Tolerating medication: Yes  Family/Significant othe contact made: No, contact attempts unsuccessful Patient understands diagnosis: Yes Discussing patient identified problems/goals with staff: Yes  Medical problems stabilized or resolved: Yes  Denies suicidal/homicidal ideation: Yes Patient has not harmed self or Others: Yes   New problem(s) identified: None identified at this time.   Discharge Plan or Barriers: CSW will assess for appropriate discharge plan and relevant barriers.   04/02/15: Pt will discharge to [facility]  Additional comments: n/a   Reason for Continuation of Hospitalization:  Depression Medication stabilization Suicidal ideation  Estimated length of stay: 0 days; Pt stable for DC  Review of initial/current patient goals per problem list:   1.  Goal(s): Patient will participate in aftercare plan  Met:  Yes  Target date: 3-5 days from date of admission   As evidenced by: Patient will participate within aftercare plan AEB aftercare provider and housing plan at discharge being identified.  03/28/15: CSW to work with Pt to assess for appropriate discharge plan and faciliate appointments and referrals as needed prior to d/c. 04/02/15: Pt will discharge to [facility] and Follow-up with [facility]. 10/11: Patient will discharge to group home to follow up with outpatient services.  2.  Goal (s): Patient will exhibit decreased depressive symptoms and suicidal ideations.  Met:  Adequate for DC  Target date: 3-5 days from date of admission   As evidenced by: Patient will utilize self rating of depression at 3 or below and demonstrate decreased signs of depression or be deemed stable for discharge by MD. 03/28/15: Pt was admitted with symptoms of  depression, rating 10/10. Pt continues to present with flat affect and depressive symptoms.  Pt will demonstrate decreased symptoms of depression and rate depression at 3/10 or lower prior to discharge. 04/02/15: Pt rates depression at 7/10; denies SI 04/04/15: Pt rates depression lower today; states he feels some better- is feeling more anxious. 04/05/15: Although Pt continues to rate depression at 7/10, MD feels that his symptoms can be managed effectively in the outpatient setting. Denies SI 10/11: Adequate for discharge per MD. Denies SI.  Attendees:  Patient:    Family:    Physician: Dr. Parke Poisson, MD  04/05/2015 1:03 PM  Nursing: Lars Pinks, RN Case manager  04/05/2015 1:03 PM  Clinical Social Worker Norman Clay, MSW 04/05/2015 1:03 PM  Other: Lucinda Dell, Beverly Sessions Liasion 04/05/2015 1:03 PM  Clinical: Vira Browns, RN 04/05/2015 1:03 PM  Other: , RN Charge Nurse 04/05/2015 1:03 PM  Other:     Peri Maris, Latanya Presser MSW

## 2015-04-05 NOTE — Progress Notes (Signed)
[name] [name] has had a difficult day today...spending most of the day being anxious and worried about beng discharged today ( ie " what time will they be here to pick me up" " DO  You think I'll be ok"  ) . He  Is convinced that his BP is " too low" despite staff taking it manually and explaining that his BP's were therapeutic.(At 1700 138/78 83 HRR).    A He contracts for safety but remains sad, depressed and flat. He is convinced " something's wrong" with him and this nurse notified Dr. Parke Poisson via pc and in lieu of pt's age, sever anxiety, and fragile state, DC will be dc'[name]  ... Until pt can normalize.   R RN notified both pt and Education officer, museum. DC cancelled.

## 2015-04-05 NOTE — Progress Notes (Signed)
Recreation Therapy Notes  Date: 10.07.2016 Time: 9:30am Location: [address] Group room   Group Topic: Stress Management  Goal Area(s) Addresses:  Patient will actively participate in stress management techniques presented during session.   Behavioral Response: Did not attend.   Marykay Lex Ahlana Slaydon, LRT/CTRS        Samaad Hashem L 04/05/2015 10:09 AM

## 2015-04-05 NOTE — [facility] Group Notes (Signed)
[facility] LCSW Group Therapy 04/05/2015 1:15pm  Pt did not attend, declined invitation.   Chad Cordial, Theresia Majors [phone] 04/05/2015 5:14 PM

## 2015-04-05 NOTE — Progress Notes (Signed)
  [facility] :  Will you be returning to the same living situation after discharge:  No. Pt is discharging to [facility] family care home. At discharge, do you have transportation home?: Yes,  Joselyn Glassman to provide transportation Do you have the ability to pay for your medications: Yes,  Pt provided with prescriptions  Release of information consent forms completed and in the chart;  Patient's signature needed at discharge.  Patient to Follow up at: Follow-up Information    Follow up with [facility] On 04/17/2015.   Specialty:  Internal Medicine   Why:  at 9:00am with Marcos Eke. Please arrive 15 minutes early to complete paperwork. This appointment will also serve as a medication management appointment for your psychiatric meds until your appointment with Dr. Lolly Mustache at [facility] information:   [address] [place] [address] [phone]      Follow up with [facility] On 05/14/2015.   Specialty:  Behavioral Health   Why:  at 10:15am with Dr. Lolly Mustache for medication management.   Contact information:   [address] [place] [address] [phone]      Call [facility].   Why:  Call Hanley Ben at [phone] ext [phone]. She was given your contact information and will be calling you next week to discuss options   Contact information:   [address], [place], [place] [address] Phone: [phone]      Patient denies SI/HI: Yes,  Pt denies    Safety Planning and Suicide Prevention discussed: Yes,  with Pt; family contact unsuccessful due to limited contact information  Have you used any form of tobacco in the last 30 days? (Cigarettes, Smokeless Tobacco, Cigars, and/or Pipes): Yes  Has patient been referred to the Quitline?: Patient refused referral  Elaina Hoops 04/05/2015, 12:52 PM

## 2015-04-06 NOTE — Progress Notes (Signed)
Pt stated that he had a good day overall, even though it started off slow. One positive coping skill that he has is to learn from others. What he wants to learn from this hospitalization is different coping mechanisms.

## 2015-04-06 NOTE — [facility] Group Notes (Signed)
[facility] Group Notes:  (Clinical Social Work)  04/06/2015     1:15-2:15PM  Summary of Progress/Problems:   The main focus of today's process group was to learn how to use a decisional balance exercise to move forward in the Stages of Change.  Patients listed needs on the whiteboard and unhealthy coping techniques often used to fill needs.  Motivational Interviewing and the whiteboard were utilized to help patients explore in depth the perceived benefits and costs of unhealthy coping techniques, as well as the  benefits and costs of replacing that with a healthy coping skills.   The patient expressed that he needs help with learning coping techniques to "live in the moment" and so CSW initiated talk about mindfulness, gave examples and homework.  Type of Therapy:  Group Therapy - Process   Participation Level:  Active  Participation Quality:  Attentive, Sharing and Supportive  Affect:  Blunted  Cognitive:  Alert, Appropriate and Oriented  Insight:  Developing/Improving  Engagement in Therapy:  Engaged  Modes of Intervention:  Education, Motivational Interviewing  Ambrose Mantle, LCSW 04/06/2015, 4:02 PM

## 2015-04-06 NOTE — Progress Notes (Signed)
[name] is in  his bed...in his  room. Each time this nurse goes  Into  his room to check on him, he says  " I just want to  Sleep a little longer..."    A HE remains anxious, worried and ruminates about ' whats going to happen to me...'    R Safety is in place and poc cont.

## 2015-04-06 NOTE — Progress Notes (Signed)
.  Psychoeducational Group Note    Date: 04/06/2015 Time:  0930    Goal Setting Purpose of Group: To be able to set a goal that is measurable and that can be accomplished in one day   Participation Level: did not attend    [name] [name]

## 2015-04-06 NOTE — Progress Notes (Signed)
Patient ID: [name] [name], male   DOB: 12/16/1945, 69 y.o.   MRN: [id number] D: Client visible on the unit, sitting in dayroom watching TV, reports depression "7" of 10. Client says he notice "depression more in the morning" "afternoon always better" Client thinks socializing helps. A: Writer encouraged client to continue taking a part in day activities. Staff will monitor q5min for safety. R: Client is safe on the unit, attended group.

## 2015-04-06 NOTE — Progress Notes (Signed)
Patient ID: [name] [name], male   DOB: [birth year], 69 y.o.   MRN: [id number] [facility] MD Progress Note  04/06/2015 11:25 AM [name] [name]  MRN:  [id number] Subjective:  Patient  Reports improvement in his mood today and "not to bad." . He states no panic attacks today, but rates his anxiety at 9/10, depression 7/10, and hopelessness 9/10. He states when he woke up this morning he was ok, I took my meds and now I cant face the group.  At this time denies any chest pain, and does not present with diaphoresis, pallor. No SOB at room air . Anxiety yesterday was r/t upcoming discharge from the unit. He acknowledges he has a fear of the unknown, and does not know how Group Home will work out for him. However, he states " I realize it is a good option for me at this time, so I am going to go. It is preferable to being homeless. " Patient's incontinence , urinary symptoms have abated to some degree and he feels he is now better able to manage pads, adult diapers better than he had been . He seems less focused and concerned about this issue at present. He does state he plans to follow up with his PCP at [facility], and with his Urologist , Dr. Westley Hummer, after discharge .  Objective : I have discussed case with treatment team  and have met with patient. Patient remains depressed, although better than upon admission, and remains anxious about his discharge. Although apprehensive, he is amenable to support, reassurance, feedback, and states he has decided to proceed with this discharge option ( going to group home ) .  He denies medication side effects. No disruptive or agitated behaviors on unit, cooperative upon approach.   Principal Problem: MDD (major depressive disorder), recurrent episode, moderate ([place]) Diagnosis:   Patient Active Problem List   Diagnosis Date Noted  . MDD (major depressive disorder), recurrent episode, moderate ([place]) [F33.1] 03/27/2015  . Major depressive disorder, recurrent episode, moderate  ([place]) [F33.1] 03/25/2015  . Midsternal chest pain [R07.2] 03/23/2015  . BPH (benign prostatic hypertrophy) [N40.0] 03/23/2015  . Homelessness [Z59.0] 03/23/2015  . Elevated troponin [R79.89] 03/23/2015  . Hypokalemia [E87.6] 03/22/2015  . Hydronephrosis [N13.30] 03/22/2015  . Urinary retention due to benign prostatic hyperplasia [N28.89, R33.8] 03/22/2015  . Unstable angina ([place]) [I20.0] 03/19/2015  . Chest pain [R07.9] 03/19/2015  . Abnormal nuclear cardiac imaging test [R93.1] 03/19/2015  . OBESITY, CLASS III [E66.01] 09/04/2008  . EDEMA [R60.9] 08/10/2008  . DERMATOPHYTOSIS OF GROIN AND PERIANAL AREA [B35.6] 12/28/2007  . CAD S/P percutaneous coronary angioplasty - h/p PCI to RCA; Moderate LAD lesion with FFR 0.85 (med Rx) [I25.10, Z98.61] 10/06/2007  . Dyslipidemia [E78.5] 03/29/2007  . DEPRESSION/ANXIETY [F34.1] 03/29/2007  . Essential hypertension [I10] 03/29/2007  . GASTROESOPHAGEAL REFLUX DISEASE [K21.9] 03/29/2007  . ALLERGY [T78.40XA] 03/29/2007  . PROSTATITIS, HX OF [Z87.448] 03/29/2007  . 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 Edema   . Dermatophytosis of groin and perianal area   . CAD (coronary artery disease)     cath '06, repeat '08 non-obstructive disease  . GERD (gastroesophageal reflux disease)   . Hyperlipidemia   . Allergy   . History of prostatitis   . History of BPH   . Hypertension   . Anxiety   . Depression   .  Myocardial infarction ~ 08/2014    Past Surgical History  Procedure Laterality Date  . Tonsillectomy and adenoidectomy    . Coronary angioplasty with stent placement  ~ 08/2014    "1 stent"  . Cardiac catheterization  05/07/2005   . Cardiac catheterization N/A 03/20/2015    Procedure: Left Heart Cath and Coronary Angiography;  Surgeon: Belva Crome, MD;  Location: [facility];  Service: Cardiovascular;   Laterality: N/A;   Family History:  Family History  Problem Relation Age of Onset  . Stroke Mother   . Coronary artery disease Father   . Heart attack Father   . Cancer Neg Hx     colon or prostate cancer   Social History:  History  Alcohol Use  . Yes    Comment: ocassionally     History  Drug Use No    Social History   Social History  . Marital Status: Divorced    Spouse Name: N/A  . Number of Children: N/A  . Years of Education: N/A   Social History Main Topics  . Smoking status: Former Smoker    Types: Cigars  . Smokeless tobacco: Never Used     Comment: quit smoking in 2010  . Alcohol Use: Yes     Comment: ocassionally  . Drug Use: No  . Sexual Activity:    Partners: Female   Other Topics Concern  . 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 acetaminophen (TYLENOL) tablet 650 mg  650 mg Oral Q6H PRN Laverle Hobby, PA-C   650 mg at 04/05/15 1851  . alum & mag hydroxide-simeth (MAALOX/MYLANTA) 200-200-20 MG/5ML suspension 30 mL  30 mL Oral Q4H PRN Laverle Hobby, PA-C      . aspirin EC tablet 81 mg  81 mg Oral Daily Laverle Hobby, PA-C   81 mg at 04/06/15 0847  . atorvastatin (LIPITOR) tablet 40 mg  40 mg Oral Daily Laverle Hobby, PA-C   40 mg at 04/06/15 0847  . busPIRone (BUSPAR) tablet 15 mg  15 mg Oral TID Jenne Campus, MD   15 mg at 04/06/15 0848  . clopidogrel (PLAVIX) tablet 75 mg  75 mg Oral Daily Laverle Hobby, PA-C   75 mg at 04/06/15 0847  . DULoxetine (CYMBALTA) DR capsule 90 mg  90 mg Oral Daily Jenne Campus, MD   90 mg at 04/06/15 0847  . feeding supplement (ENSURE ENLIVE) (ENSURE  ENLIVE) liquid 237 mL  237 mL Oral BID BM Jenne Campus, MD   237 mL at 04/05/15 1852  . finasteride (PROSCAR) tablet 5 mg  5 mg Oral Daily Laverle Hobby, PA-C   5 mg at 04/06/15 0847  . isosorbide mononitrate (IMDUR) 24 hr tablet 30 mg  30 mg Oral Daily Laverle Hobby, PA-C   30 mg at 04/06/15 0847  . magnesium hydroxide (MILK OF MAGNESIA) suspension 30 mL  30 mL Oral Daily PRN Laverle Hobby, PA-C      . metoprolol tartrate (LOPRESSOR) tablet 75 mg  75 mg Oral BID Benjamine Mola, FNP   75 mg at 04/06/15 0847  .  nitroGLYCERIN (NITROSTAT) SL tablet 0.4 mg  0.4 mg Sublingual Q5 min PRN Laverle Hobby, PA-C      . pantoprazole (PROTONIX) EC tablet 40 mg  40 mg Oral Q1200 Laverle Hobby, PA-C   40 mg at 04/05/15 1208  . potassium chloride SA (K-DUR,KLOR-CON) CR tablet 20 mEq  20 mEq Oral Daily Laverle Hobby, PA-C   20 mEq at 04/06/15 0848  . tamsulosin (FLOMAX) capsule 0.8 mg  0.8 mg Oral Daily Benjamine Mola, FNP   0.8 mg at 04/06/15 0848  . traZODone (DESYREL) tablet 25 mg  25 mg Oral QHS,MR X 1 Jenne Campus, MD   25 mg at 04/05/15 2232    Lab Results:  No results found for this or any previous visit (from the past 48 hour(s)).  Physical Findings: AIMS: Facial and Oral Movements Muscles of Facial Expression: None, normal Lips and Perioral Area: None, normal Jaw: None, normal Tongue: None, normal,Extremity Movements Upper (arms, wrists, hands, fingers): None, normal Lower (legs, knees, ankles, toes): None, normal, Trunk Movements Neck, shoulders, hips: None, normal, Overall Severity Severity of abnormal movements (highest score from questions above): None, normal Incapacitation due to abnormal movements: None, normal Patient's awareness of abnormal movements (rate only patient's report): No Awareness, Dental Status Current problems with teeth and/or dentures?: No Does patient usually wear dentures?: No  CIWA:  CIWA-Ar Total: 1 COWS:  COWS Total Score:  1  Musculoskeletal: Strength & Muscle Tone: within normal limits Gait & Station: normal Patient leans: N/A  Psychiatric Specialty Exam: ROS   Voiding spontaneously, ongoing incontinence   Blood pressure 163/78, pulse 83, temperature 98.4 F (36.9 C), temperature source Oral, resp. rate 18, height 5' 5.25" (1.657 m), weight 89.812 kg (198 lb).Body mass index is 32.71 kg/(m^2).  General Appearance:  Fairly groomed   Engineer, water::  Good  Speech:  Normal Rate  Volume:  Normal  Mood:    Depressed, although better than upon admission  Affect:     Less constricted but more anxious , particularly ruminative about how  Group Home will work out for him.  Thought Process:  Linear  Orientation:  Other:  fully alert and attentive   Thought Content:  no hallucinations, no delusions, not internally preoccupied   Suicidal Thoughts:  No- denies any current SI or any current self injurious ideations- contracts for safety  Homicidal Thoughts:  No  Memory:  recent and remote grossly intact   Judgement:  Other:  improving  Insight:  improving   Psychomotor Activity:   Decreased- still tends to isolate in room  Concentration:  Good  Recall:  Good  Fund of Knowledge:Good  Language: Good  Akathisia:  Negative  Handed:  Right  AIMS (if indicated):     Assets:  Desire for Improvement Resilience  ADL's:  Fair   Cognition: WNL  Sleep:  Number of Hours: 6.75  Assessment -   Patient is improving gradually, partially. Mood does seem to be improving gradually. Anxiety is ongoing , and at this time relates mostly to feeling apprehensive about  Discharge and about what Group Home living experience will be . He is not suicidal. His urinary symptoms persist, but has been able to void spontaneously.  Tolerating medications well .  Treatment Plan Summary: Daily contact with patient to assess and evaluate symptoms and progress in treatment, Medication management, Plan inpatient treatment and medications as  below  Continue  Cymbalta   90  mgrs QDAY for depression and anxiety  Continue Buspar   15  mgrs TID for  Ongoing symptoms of anxiety Continue Flomax for urinary symptoms/ BPH Continue Trazodone 25 mgrs QHS PRN insomnia Continue Plavix, Nitroglycerin PRNs to address history of CAD Continue Protonix for GERD  Patient /CSW working on disposition options- likely group home bed available today or tomorrow- and patient agreeing with this disposition option. Patient/ team working to set up new appointments at [facility], and with Urologist , for ongoing outpatient treatment , monitoring . Continue to encourage  Milieu/ group  participation  To work on coping skills  And reduction of symptoms. Likely discharge [place] patient continues to improve. Patient agrees to immediately contact staff if any further episodes of chest pain should ensue .   Priscille Loveless S FNP-BC 04/06/2015, 11:25 AM

## 2015-04-07 NOTE — Progress Notes (Signed)
[facility] Group Notes:  (Nursing/MHT/Case Management/Adjunct)  Date:  04/07/2015  Time:  2100  Type of Therapy:  wrap up group  Participation Level:  Active  Participation Quality:  Appropriate, Attentive, Sharing and Supportive  Affect:  Appropriate  Cognitive:  Appropriate  Insight:  Improving  Engagement in Group:  Engaged  Modes of Intervention:  Clarification, Education and Support  Summary of Progress/Problems:  Pt reports that he tends to have a hard and slow start in the mornings due to that his when his depression is worse. Pt feels better as the day goes along.  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   Shelah Lewandowsky 04/07/2015, 10:59 PM

## 2015-04-07 NOTE — Progress Notes (Signed)
Psychoeducational Group Note  Date: 04/07/2015 Time:  0930 Group Topic/Focus:  Gratefulness:  The focus of this group is to help patients identify what two things they are most grateful for in their lives. What helps ground them and to center them on their work to their recovery.  Participation Level:  Did not attend    Dione Housekeeper

## 2015-04-07 NOTE — [facility] Group Notes (Signed)
.  [facility] Group Notes: (Clinical Social Work)   04/07/2015      Type of Therapy:  Group Therapy   Participation Level:  Did Not Attend despite MHT prompting   Richell Corker Grossman-Orr, LCSW 04/07/2015, 4:18 PM     

## 2015-04-07 NOTE — Progress Notes (Signed)
[name] [name] cont to have a diff time getting [name] [name] out of his bed in the mornings. H e says " I just can't do it now... I just can't get myself up and moving in the mornings..." He avoids making eye contact with [name] [name] and he looks down at the floor mostly, instead of holding his head up and looking ahead. He says "  I ddnt pee the bed last night...hows about that??" [name]  [name] asked him if he knew why not and he is unable to identify how or why [name] happened.   A He is encouraged to get up, bathe and come to groups. Per his request, [name] [name] woke him up at 1030...( to tell him to come to group at 1045) and he did not feel like he could come. He takes his scheduled medications as planned and he has been quite forthcoming to [name] Clinical research associate today..in   that he has shared personal feelings and stories... related to how he views his mental illness has affected him and how he  Realizes ( now) that he he has contributed to his emotional stuckness.   R POC cont.

## 2015-04-07 NOTE — Progress Notes (Signed)
Patient ID: [name] [name], male   DOB: Nov 26, 1945, 69 y.o.   MRN: [id number] [facility] MD Progress Note  04/07/2015 1:55 PM Erick Murin  MRN:  [id number] Subjective:  Patientreports improvement in his mood today and he is observed walking down the hall and interacting with peers and staff.  He rates his anxiety at 0/10, depression 0/10, and hopelessness 0/10. He states he went to group yesterday and learned so much about himself. "It takes a hell of an effort to do things, I cant do anything about it until tomorrow. See the problem is I worry so much. I have learned about mindful thinking, and future vs present concepts. I am trying to stop dwelling on the future and focus on the present.   Objective : I have discussed case with treatment team  and have met with patient. Patient remains depressed, although better than upon admission, and remains anxious about his discharge. Although apprehensive, he is amenable to support, reassurance, feedback, and states he has decided to proceed with this discharge option ( going to group home ) .  He denies medication side effects. No disruptive or agitated behaviors on unit, cooperative upon approach.   Principal Problem: MDD (major depressive disorder), recurrent episode, moderate ([place]) Diagnosis:   Patient Active Problem List   Diagnosis Date Noted  . MDD (major depressive disorder), recurrent episode, moderate ([place]) [F33.1] 03/27/2015  . Major depressive disorder, recurrent episode, moderate ([place]) [F33.1] 03/25/2015  . Midsternal chest pain [R07.2] 03/23/2015  . BPH (benign prostatic hypertrophy) [N40.0] 03/23/2015  . Homelessness [Z59.0] 03/23/2015  . Elevated troponin [R79.89] 03/23/2015  . Hypokalemia [E87.6] 03/22/2015  . Hydronephrosis [N13.30] 03/22/2015  . Urinary retention due to benign prostatic hyperplasia [N28.89, R33.8] 03/22/2015  . Unstable angina ([place]) [I20.0] 03/19/2015  . Chest pain [R07.9] 03/19/2015  . Abnormal nuclear cardiac imaging test  [R93.1] 03/19/2015  . OBESITY, CLASS III [E66.01] 09/04/2008  . EDEMA [R60.9] 08/10/2008  . DERMATOPHYTOSIS OF GROIN AND PERIANAL AREA [B35.6] 12/28/2007  . CAD S/P percutaneous coronary angioplasty - h/p PCI to RCA; Moderate LAD lesion with FFR 0.85 (med Rx) [I25.10, Z98.61] 10/06/2007  . Dyslipidemia [E78.5] 03/29/2007  . DEPRESSION/ANXIETY [F34.1] 03/29/2007  . Essential hypertension [I10] 03/29/2007  . GASTROESOPHAGEAL REFLUX DISEASE [K21.9] 03/29/2007  . ALLERGY [T78.40XA] 03/29/2007  . PROSTATITIS, HX OF [Z87.448] 03/29/2007  . 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 Edema   . Dermatophytosis of groin and perianal area   . CAD (coronary artery disease)     cath '06, repeat '08 non-obstructive disease  . GERD (gastroesophageal reflux disease)   . Hyperlipidemia   . Allergy   . History of prostatitis   . History of BPH   . Hypertension   . Anxiety   . Depression   . Myocardial infarction ~ 08/2014    Past Surgical History  Procedure Laterality Date  . Tonsillectomy and adenoidectomy    . Coronary angioplasty with stent placement  ~ 08/2014    "1 stent"  . Cardiac catheterization  05/07/2005   . Cardiac catheterization N/A 03/20/2015    Procedure: Left Heart Cath and Coronary Angiography;  Surgeon: Belva Crome, MD;  Location: [facility];  Service: Cardiovascular;  Laterality: N/A;   Family History:  Family History  Problem Relation Age of Onset  . Stroke Mother   .  Coronary artery disease Father   . Heart attack Father   . Cancer Neg Hx     colon or prostate cancer   Social History:  History  Alcohol Use  . Yes    Comment: ocassionally     History  Drug Use No    Social History   Social History  . Marital Status: Divorced    Spouse Name: N/A  . Number of Children: N/A  . Years of Education: N/A   Social History Main  Topics  . Smoking status: Former Smoker    Types: Cigars  . Smokeless tobacco: Never Used     Comment: quit smoking in 2010  . Alcohol Use: Yes     Comment: ocassionally  . Drug Use: No  . Sexual Activity:    Partners: Female   Other Topics Concern  . 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 acetaminophen (TYLENOL) tablet 650 mg  650 mg Oral Q6H PRN Laverle Hobby, PA-C   650 mg at 04/06/15 1209  . alum & mag hydroxide-simeth (MAALOX/MYLANTA) 200-200-20 MG/5ML suspension 30 mL  30 mL Oral Q4H PRN Laverle Hobby, PA-C      . aspirin EC tablet 81 mg  81 mg Oral Daily Laverle Hobby, PA-C   81 mg at 04/07/15 0848  . atorvastatin (LIPITOR) tablet 40 mg  40 mg Oral Daily Laverle Hobby, PA-C   40 mg at 04/07/15 0848  . busPIRone (BUSPAR) tablet 15 mg  15 mg Oral TID Jenne Campus, MD   15 mg at 04/07/15 1150  . clopidogrel (PLAVIX) tablet 75 mg  75 mg Oral Daily Laverle Hobby, PA-C   75 mg at 04/07/15 0847  . DULoxetine (CYMBALTA) DR capsule 90 mg  90 mg Oral Daily Jenne Campus, MD   90 mg at 04/07/15 0848  . feeding supplement (ENSURE ENLIVE) (ENSURE ENLIVE) liquid 237 mL  237 mL Oral BID BM Myer Peer Cobos, MD   237 mL at 04/07/15 1000  . finasteride (PROSCAR) tablet 5 mg  5 mg Oral Daily Laverle Hobby, PA-C   5 mg at 04/07/15 0848  . isosorbide mononitrate (IMDUR) 24 hr tablet 30 mg  30 mg Oral Daily Laverle Hobby, PA-C   30 mg at 04/07/15 0848  . magnesium hydroxide (MILK OF MAGNESIA) suspension 30 mL  30 mL Oral Daily PRN Laverle Hobby, PA-C      . metoprolol tartrate (LOPRESSOR) tablet 75 mg  75 mg Oral BID Benjamine Mola, FNP   75 mg at 04/07/15 0849  . nitroGLYCERIN (NITROSTAT) SL tablet 0.4 mg  0.4 mg Sublingual Q5 min PRN Laverle Hobby, PA-C      . pantoprazole (PROTONIX) EC tablet 40 mg  40 mg Oral Q1200 Laverle Hobby, PA-C   40 mg at 04/07/15 1150  . potassium chloride SA (K-DUR,KLOR-CON) CR tablet 20 mEq  20 mEq Oral Daily Laverle Hobby, PA-C   20 mEq at 04/07/15 0800  . tamsulosin (FLOMAX) capsule 0.8 mg  0.8 mg Oral Daily Benjamine Mola, FNP   0.8 mg at 04/07/15 0849  . traZODone (  DESYREL) tablet 25 mg  25 mg Oral QHS,MR X 1 Jenne Campus, MD   25 mg at 04/06/15 2145    Lab Results:  No results found for this or any previous visit (from the past 48 hour(s)).  Physical Findings: AIMS: Facial and Oral Movements Muscles of Facial Expression: None, normal Lips and Perioral Area: None, normal Jaw: None, normal Tongue: None, normal,Extremity Movements Upper (arms, wrists, hands, fingers): None, normal Lower (legs, knees, ankles, toes): None, normal, Trunk Movements Neck, shoulders, hips: None, normal, Overall Severity Severity of abnormal movements (highest score from questions above): None, normal Incapacitation due to abnormal movements: None, normal Patient's awareness of abnormal movements (rate only patient's report): No Awareness, Dental Status Current problems with teeth and/or dentures?: No Does patient usually wear dentures?: No  CIWA:  CIWA-Ar Total: 1 COWS:  COWS Total Score: 1  Musculoskeletal: Strength & Muscle Tone: within normal limits Gait & Station: normal Patient leans: N/A  Psychiatric Specialty Exam: ROS   Voiding spontaneously, ongoing incontinence   Blood pressure 169/96, pulse 91, temperature 97.8 F (36.6 C), temperature source Oral, resp. rate 18, height 5' 5.25" (1.657 m), weight 89.812 kg (198 lb).Body mass index is 32.71 kg/(m^2).  General Appearance:  Fairly groomed   Engineer, water::  Good  Speech:  Normal Rate  Volume:  Normal  Mood:     Depressed, although better than upon admission  Affect:     Less constricted and more reactive, particularly ruminative about future vs present concepts.   Thought Process:  Linear  Orientation:  Other:  fully alert and attentive   Thought Content:  no hallucinations, no delusions, not internally preoccupied   Suicidal Thoughts:  No- denies any current SI or any current self injurious ideations- contracts for safety  Homicidal Thoughts:  No  Memory:  recent and remote grossly intact   Judgement:  Other:  improving  Insight:  improving   Psychomotor Activity:   Normal  Concentration:  Good  Recall:  Good  Fund of Knowledge:Good  Language: Good  Akathisia:  Negative  Handed:  Right  AIMS (if indicated):     Assets:  Desire for Improvement Resilience  ADL's:  Fair   Cognition: WNL  Sleep:  Number of Hours: 5.75  Assessment -   Patient is improving gradually, partially. Mood does seem to be improving gradually. Anxiety is ongoing , and at this time relates mostly to feeling apprehensive about  Discharge and about what Group Home living experience will be . He is not suicidal. His urinary symptoms persist, but has been able to void spontaneously.  Tolerating medications well .  Treatment Plan Summary: Daily contact with patient to assess and evaluate symptoms and progress in treatment, Medication management, Plan inpatient treatment and medications as below  Continue  Cymbalta   90  mgrs QDAY for depression and anxiety Continue Buspar   15  mgrs TID for  Ongoing symptoms of anxiety Continue Flomax for urinary symptoms/ BPH Continue Trazodone 25 mgrs QHS PRN insomnia Continue Plavix, Nitroglycerin PRNs to address history of CAD Continue Protonix for GERD  Patient /CSW working on disposition options- likely group home bed available today or tomorrow- and patient agreeing with this disposition option. Patient/ team working to set up new appointments at [facility], and  with Urologist , for ongoing outpatient treatment , monitoring . Continue to encourage  Milieu/ group  participation  To work on coping skills  And reduction of symptoms. Likely discharge [place]  patient continues to improve. Patient agrees to immediately contact staff if any further episodes of chest pain should ensue .   Priscille Loveless S FNP-BC 04/07/2015, 1:55 PM

## 2015-04-08 MED ORDER — DULOXETINE HCL 60 MG PO CPEP
60.0000 mg | ORAL_CAPSULE | Freq: Two times a day (BID) | ORAL | Status: DC
  Administered 2015-04-08 – 2015-04-09 (×2): 60 mg via ORAL
  Filled 2015-04-08 (×6): qty 1

## 2015-04-08 NOTE — [facility] Group Notes (Signed)
[facility] LCSW Aftercare Discharge Planning Group Note  04/08/2015 8:45 AM  Pt did not attend, declined invitation.   Prentiss Polio Carter, LCSWA 04/08/2015 9:39 AM  

## 2015-04-08 NOTE — Progress Notes (Signed)
Patient ID: [name] [name], male   DOB: 03/20/1946, 70 y.o.   MRN: [id number] [facility] MD Progress Note  04/08/2015 4:22 PM [name] [name]  MRN:  [id number] Subjective:  Patient had been scheduled for discharge last Friday, with plan of going to [place]. Discharge was cancelled after patient refused to leave and presented with severe anxiety/ panic type symptoms.  Today he remains anxious , ruminative, but to less severe degree than on Friday. He denies medication side effect . He worries about his BP, which has been slightly elevated at times- we have reviewed likelihood that anxiety is contributing to BP elevation as well.   Objective : I have discussed case with treatment team  and have met with patient. Have met patient with CSW. Patient's mood is less depressed than it had been upon admission, but anxiety has been severe , and as noted, he became acutely, severely anxious recently on day of planned discharge and refused to leave unit due to severity of anxiety symptoms. Patient remains anxious, but to a lesser degree and continues to express desire to go to [place] on discharge . To the best of our ability, we have provided information, feedback to patient to minimize his anxiety, which he states is in part related to " not knowing what a group home is like ". Prior to admission he had been living in a hotel , with minimal social support, and became severely depressed . He states " I know I need to go to place where I will have support, I can't live alone right now ". Patient also having increased somatic preoccupations, particularly regarding BP, but denies any chest pain , any shortness of breath, any headache and does not appear to be in any acute distress . BP today 159/77.  He is tolerating medications well and denies side effects. No disruptive behaviors on unit. At this time denies any suicidal or self injurious ideations .     Principal Problem: MDD (major depressive disorder), recurrent  episode, moderate () Diagnosis:   Patient Active Problem List   Diagnosis Date Noted  . MDD (major depressive disorder), recurrent episode, moderate ([place]) [F33.1] 03/27/2015  . Major depressive disorder, recurrent episode, moderate ([place]) [F33.1] 03/25/2015  . Midsternal chest pain [R07.2] 03/23/2015  . BPH (benign prostatic hypertrophy) [N40.0] 03/23/2015  . Homelessness [Z59.0] 03/23/2015  . Elevated troponin [R79.89] 03/23/2015  . Hypokalemia [E87.6] 03/22/2015  . Hydronephrosis [N13.30] 03/22/2015  . Urinary retention due to benign prostatic hyperplasia [N28.89, R33.8] 03/22/2015  . Unstable angina ([place]) [I20.0] 03/19/2015  . Chest pain [R07.9] 03/19/2015  . Abnormal nuclear cardiac imaging test [R93.1] 03/19/2015  . OBESITY, CLASS III [E66.01] 09/04/2008  . EDEMA [R60.9] 08/10/2008  . DERMATOPHYTOSIS OF GROIN AND PERIANAL AREA [B35.6] 12/28/2007  . CAD S/P percutaneous coronary angioplasty - h/p PCI to RCA; Moderate LAD lesion with FFR 0.85 (med Rx) [I25.10, Z98.61] 10/06/2007  . Dyslipidemia [E78.5] 03/29/2007  . DEPRESSION/ANXIETY [F34.1] 03/29/2007  . Essential hypertension [I10] 03/29/2007  . GASTROESOPHAGEAL REFLUX DISEASE [K21.9] 03/29/2007  . ALLERGY [T78.40XA] 03/29/2007  . PROSTATITIS, HX OF [Z87.448] 03/29/2007  . 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 Edema   . Dermatophytosis of groin and perianal area   . CAD (coronary artery disease)     cath '06, repeat '08 non-obstructive disease  . GERD (gastroesophageal reflux  disease)   . Hyperlipidemia   . Allergy   . History of prostatitis   . History of BPH   . Hypertension   . Anxiety   . Depression   . Myocardial infarction ~ 08/2014    Past Surgical History  Procedure Laterality Date  . Tonsillectomy and adenoidectomy    . Coronary angioplasty with stent placement  ~ 08/2014     "1 stent"  . Cardiac catheterization  05/07/2005   . Cardiac catheterization N/A 03/20/2015    Procedure: Left Heart Cath and Coronary Angiography;  Surgeon: Belva Crome, MD;  Location: [facility];  Service: Cardiovascular;  Laterality: N/A;   Family History:  Family History  Problem Relation Age of Onset  . Stroke Mother   . Coronary artery disease Father   . Heart attack Father   . Cancer Neg Hx     colon or prostate cancer   Social History:  History  Alcohol Use  . Yes    Comment: ocassionally     History  Drug Use No    Social History   Social History  . Marital Status: Divorced    Spouse Name: N/A  . Number of Children: N/A  . Years of Education: N/A   Social History Main Topics  . Smoking status: Former Smoker    Types: Cigars  . Smokeless tobacco: Never Used     Comment: quit smoking in 2010  . Alcohol Use: Yes     Comment: ocassionally  . Drug Use: No  . Sexual Activity:    Partners: Female   Other Topics Concern  . 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 acetaminophen (TYLENOL) tablet 650 mg  650 mg Oral Q6H PRN SLaverle Hobby PA-C   650 mg at 04/06/15 1209  . alum & mag hydroxide-simeth (MAALOX/MYLANTA) 200-200-20 MG/5ML suspension 30 mL  30 mL Oral Q4H PRN SLaverle Hobby PA-C      . aspirin EC tablet 81 mg  81 mg Oral Daily SLaverle Hobby PA-C   81 mg at 04/08/15 04158 . atorvastatin (LIPITOR) tablet 40 mg  40 mg Oral Daily SLaverle Hobby PA-C   40 mg at 04/08/15 0851  . busPIRone (BUSPAR) tablet 15 mg  15 mg Oral TID FJenne Campus MD   15 mg at 04/08/15 1202  . clopidogrel (PLAVIX)  tablet 75 mg  75 mg Oral Daily SLaverle Hobby PA-C   75 mg at 04/08/15 0851  . DULoxetine (CYMBALTA) DR capsule 60 mg  60 mg Oral BID FMyer PeerCobos, MD      . feeding supplement (ENSURE ENLIVE) (ENSURE ENLIVE) liquid 237 mL  237 mL Oral BID BM FMyer PeerCobos, MD   237 mL at 04/08/15 1447  . finasteride (PROSCAR) tablet 5 mg  5 mg Oral Daily SLaverle Hobby PA-C   5 mg at 04/08/15 0851  . isosorbide mononitrate (IMDUR) 24 hr tablet 30 mg  30 mg Oral Daily SLaverle Hobby PA-C   30 mg at 04/08/15 03094 . magnesium hydroxide (MILK OF MAGNESIA) suspension 30 mL  30 mL Oral Daily PRN SMaurine Minister  Simon, PA-C      . metoprolol tartrate (LOPRESSOR) tablet 75 mg  75 mg Oral BID Benjamine Mola, FNP   75 mg at 04/08/15 0851  . nitroGLYCERIN (NITROSTAT) SL tablet 0.4 mg  0.4 mg Sublingual Q5 min PRN Laverle Hobby, PA-C      . pantoprazole (PROTONIX) EC tablet 40 mg  40 mg Oral Q1200 Laverle Hobby, PA-C   40 mg at 04/08/15 1202  . potassium chloride SA (K-DUR,KLOR-CON) CR tablet 20 mEq  20 mEq Oral Daily Laverle Hobby, PA-C   20 mEq at 04/08/15 3888  . tamsulosin (FLOMAX) capsule 0.8 mg  0.8 mg Oral Daily Benjamine Mola, FNP   0.8 mg at 04/08/15 2800  . traZODone (DESYREL) tablet 25 mg  25 mg Oral QHS,MR X 1 Jenne Campus, MD   25 mg at 04/07/15 2219    Lab Results:  No results found for this or any previous visit (from the past 48 hour(s)).  Physical Findings: AIMS: Facial and Oral Movements Muscles of Facial Expression: None, normal Lips and Perioral Area: None, normal Jaw: None, normal Tongue: None, normal,Extremity Movements Upper (arms, wrists, hands, fingers): None, normal Lower (legs, knees, ankles, toes): None, normal, Trunk Movements Neck, shoulders, hips: None, normal, Overall Severity Severity of abnormal movements (highest score from questions above): None, normal Incapacitation due to abnormal movements: None, normal Patient's awareness of abnormal movements (rate only  patient's report): No Awareness, Dental Status Current problems with teeth and/or dentures?: No Does patient usually wear dentures?: No  CIWA:  CIWA-Ar Total: 1 COWS:  COWS Total Score: 1  Musculoskeletal: Strength & Muscle Tone: within normal limits Gait & Station: normal Patient leans: N/A  Psychiatric Specialty Exam: ROS  Voiding spontaneously, ongoing incontinence   Blood pressure 159/77, pulse 90, temperature 98.2 F (36.8 C), temperature source Oral, resp. rate 18, height 5' 5.25" (1.657 m), weight 198 lb (89.812 kg).Body mass index is 32.71 kg/(m^2).  General Appearance:  Improved grooming  Eye Contact::  Good  Speech:  Normal Rate  Volume:  Normal  Mood:   Less depressed, but having significant anxiety   Affect:    Anxious   Thought Process:  Linear  Orientation:  Other:  fully alert and attentive   Thought Content:  no hallucinations, no delusions, not internally preoccupied , ruminative   Suicidal Thoughts:  No- denies any current SI or any current self injurious ideations- contracts for safety  Homicidal Thoughts:  No  Memory:  recent and remote grossly intact   Judgement:  Other:  improving  Insight:  improving   Psychomotor Activity:   Normal  Concentration:  Good  Recall:  Good  Fund of Knowledge:Good  Language: Good  Akathisia:  Negative  Handed:  Right  AIMS (if indicated):     Assets:  Desire for Improvement Resilience  ADL's:  Fair   Cognition: WNL  Sleep:  Number of Hours: 5.25  Assessment -    Patient less depressed , but has developed severe anxiety as he approaches planned discharge to a group home setting, which he is agreeing is a good disposition plan for him. He refused to discharge on Friday as planned due to severe anxiety, panic symptoms which occurred in the context of planned discharge. Today less anxious, but still quite ruminative, and anxious. Denies any suicidal ideations. No psychotic symptoms. Denies medication side effects.    Treatment Plan Summary: Daily contact with patient to assess and evaluate symptoms and progress  in treatment, Medication management, Plan inpatient treatment and medications as below  Increase Cymbalta   To 60  mgrs BID  for depression and anxiety Continue Buspar   15  mgrs TID for   anxiety Continue Flomax for urinary symptoms/ BPH Continue Trazodone 25 mgrs QHS PRN insomnia Continue Plavix, Nitroglycerin PRNs to address history of CAD- of note, patient denies any chest pain or discomfort at this time Continue Protonix for GERD  He is now on Imdur and Metoprolol for management of BP  Start Ativan 0.5 mgrs Q 8 hours PRN for severe anxiety .  D/C planning continues to be to go to [place], as planned- patient in agreement  .     Neita Garnet  MD  04/08/2015, 4:22 PM

## 2015-04-08 NOTE — [facility] Group Notes (Signed)
[facility] LCSW Group Therapy  04/08/2015 1:15pm  Type of Therapy:  Group Therapy vercoming Obstacles  Participation Level:  Active  Participation Quality:  Appropriate   Affect:  Appropriate  Cognitive:  Appropriate and Oriented  Insight:  Developing/Improving and Improving  Engagement in Therapy:  Improving  Modes of Intervention:  Discussion, Exploration, Problem-solving and Support  Description of Group:   In this group patients will be encouraged to explore what they see as obstacles to their own wellness and recovery. They will be guided to discuss their thoughts, feelings, and behaviors related to these obstacles. The group will process together ways to cope with barriers, with attention given to specific choices patients can make. Each patient will be challenged to identify changes they are motivated to make in order to overcome their obstacles. This group will be process-oriented, with patients participating in exploration of their own experiences as well as giving and receiving support and challenge from other group members.  Summary of Patient Progress: Pt participated voluntarily in group discussion and identified his anxiety and inability to effectively cope with it as his primary obstacle of becoming independent at discharge. He expresses that society often pushes one to feel invaluable if one is not successful and making a lot of money.   Therapeutic Modalities:   Cognitive Behavioral Therapy Solution Focused Therapy Motivational Interviewing Relapse Prevention Therapy   Chad Cordial, LCSWA 04/08/2015 5:16 PM

## 2015-04-08 NOTE — Progress Notes (Signed)
D:Patient in his room on approach.  Patient states he spent most he was worried about a courth date in [place] county that his is unsure of the date.  Patient states he thought about it today.  Patient states he still does have some depression.  Patient has been minimal on the unit tonight.  Patient denies SI/HI and denies AVH. A: Staff to monitor Q 15 mins for safety.  Encouragement and support offered.  Scheduled medications administered per orders. R: Patient remains safe on the unit.  Patient attended group tonight.  Patient visible on the unit and taking administered medications.

## 2015-04-08 NOTE — Progress Notes (Signed)
Adult Psychoeducational Group Note  Date:  04/08/2015 Time:  8:25 PM  Group Topic/Focus:  Wrap-Up Group:   The focus of this group is to help patients review their daily goal of treatment and discuss progress on daily workbooks.  Participation Level:  Active  Participation Quality:  Appropriate  Affect:  Appropriate  Cognitive:  Appropriate  Insight: Good  Engagement in Group:  Engaged  Modes of Intervention:  Discussion  Additional Comments:  Pt rated overall day a 6 out of 10 because he is preparing for discharge and he realized that he forgot about a court date that he had and was supposed to attend. Pt noted that a positive part of his day was attending the groups and the positive fellowship with other patients along with the positive advice and comments that he received during groups. His goal for the day was to prepare for discharge, which he feels that he did not achieve because he was distracted by the court situation.   Cleotilde Neer 04/08/2015, 9:16 PM

## 2015-04-08 NOTE — Progress Notes (Signed)
D: Pt reports an overall improvement in his mood. Pt states that he has made an effort to increase his time outside of his room. Pt remained in the dayroom for the majority of the evening. Pt is currently denying any SI/HI/AVH. Pt was appropriate in affect and pleasant in mood. Pt reports some anxiety related to his upcoming discharge to the group home. However, pt is not opposed to going to the group home at this time. Pt had preferred to tour the group home prior to placement, but was aware of his current circumstances. A: Writer administered scheduled medications to pt, per MD orders. Continued support and availability as needed was extended to this pt. Staff continue to monitor pt with q52min checks.  R: No adverse drug reactions noted. Pt receptive to treatment. Pt remains safe at this time.

## 2015-04-08 NOTE — Progress Notes (Signed)
Patient ID: [name] [name], male   DOB: 02-Oct-1945, 69 y.o.   MRN: [id number]  DAR: Pt. 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 He reports he woke up at 1 am last night but went back to bed so his overall sleep was good last night. He reports his appetite is good, energy level is low, and concentration is good. He rates his depression 7/10, hopelessness 8/10, and anxiety is 8/10. Patient does not report any pain or discomfort at this time. Support and encouragement provided to the patient. Patient is receptive and cooperative. He reports depressing thoughts throughout the day but he reports mornings are the hardest and after he starts moving he feel better. Q15 minute checks are maintained for safety.

## 2015-04-09 MED ORDER — LORAZEPAM 1 MG PO TABS
1.0000 mg | ORAL_TABLET | Freq: Once | ORAL | Status: AC
  Administered 2015-04-09: 1 mg via ORAL
  Filled 2015-04-09: qty 1

## 2015-04-09 MED ORDER — DULOXETINE HCL 60 MG PO CPEP: 60.0000 mg | ORAL_CAPSULE | Freq: Two times a day (BID) | ORAL | Status: DC

## 2015-04-09 NOTE — Progress Notes (Signed)
Discharge Note:  Patient was picked up by driver going to [facility] group home.  Patient denied SI and HI.  Denied A/V hallucinations.  Denied pain.  Suicide prevention information given and discussed with patient who stated he understood and had no questions.  Patient stated he received all his belongings, clothing, wallet, phone/charger, comb, pen, watch, shoes, belt, change, prescriptions, med, etc.  Patient stated he appreciated all assistance received from [facility] staff.

## 2015-04-09 NOTE — Progress Notes (Signed)
Recreation Therapy Notes  Animal-Assisted Activity (AAA) Program Checklist/Progress Notes Patient Eligibility Criteria Checklist & Daily Group note for Rec Tx Intervention  Date: 10.11.2016 Time: 2:45pm Location: [address] Dayroom    AAA/T Program Assumption of Risk Form signed by Patient/ or Parent Legal Guardian yes  Patient is free of allergies or sever asthma yes  Patient reports no fear of animals yes  Patient reports no history of cruelty to animals yes  Patient understands his/her participation is voluntary yes  Patient washes hands before animal contact yes  Patient washes hands after animal contact yes  Behavioral Response: Appropriate   Education: Hand Washing, Appropriate Animal Interaction   Education Outcome: Acknowledges education.   Clinical Observations/Feedback: Patient engaged appropriately in session, petting therapy appropriately and interacting with peers appropriately.   Mikaella Escalona L Aaminah Forrester, LRT/CTRS        Zan Orlick L 04/09/2015 3:12 PM 

## 2015-04-09 NOTE — Progress Notes (Signed)
D:  Patient denied SI and HI, contracts for safety.  Denied A/V hallucinations.  Denied pain. A:  Medications administered per MD orders.  Emotional support and encouragement given patient. R:  Safety maintained with 15 minute checks.  

## 2015-04-09 NOTE — [facility] Group Notes (Signed)
The focus of this group is to educate the patient on the purpose and policies of crisis stabilization and provide a format to answer questions about their admission.  The group details unit policies and expectations of patients while admitted.  Patient did not attend 0900 nurse education orientation group this morning.  Patient stayed in bed.   

## 2015-04-09 NOTE — Discharge Summary (Signed)
Physician Discharge Summary Note  Patient:  [name] [name] is an 69 y.o., male MRN:  [id number] DOB:  1945/09/09 Patient phone:  [phone] (home)  Patient address:   [address] [place] [address],  Total Time spent with patient: 30 minutes  Date of Admission:  03/27/2015 Date of Discharge: 04/09/2015  Reason for Admission:  depression  Principal Problem: MDD (major depressive disorder), recurrent episode, moderate (HCC) Discharge Diagnoses: Patient Active Problem List   Diagnosis Date Noted  . MDD (major depressive disorder), recurrent episode, moderate (HCC) [F33.1] 03/27/2015  . Major depressive disorder, recurrent episode, moderate (HCC) [F33.1] 03/25/2015  . Midsternal chest pain [R07.2] 03/23/2015  . BPH (benign prostatic hypertrophy) [N40.0] 03/23/2015  . Homelessness [Z59.0] 03/23/2015  . Elevated troponin [R79.89] 03/23/2015  . Hypokalemia [E87.6] 03/22/2015  . Hydronephrosis [N13.30] 03/22/2015  . Urinary retention due to benign prostatic hyperplasia [N28.89, R33.8] 03/22/2015  . Unstable angina (HCC) [I20.0] 03/19/2015  . Chest pain [R07.9] 03/19/2015  . Abnormal nuclear cardiac imaging test [R93.1] 03/19/2015  . OBESITY, CLASS III [E66.01] 09/04/2008  . EDEMA [R60.9] 08/10/2008  . DERMATOPHYTOSIS OF GROIN AND PERIANAL AREA [B35.6] 12/28/2007  . CAD S/P percutaneous coronary angioplasty - h/p PCI to RCA; Moderate LAD lesion with FFR 0.85 (med Rx) [I25.10, Z98.61] 10/06/2007  . Dyslipidemia [E78.5] 03/29/2007  . DEPRESSION/ANXIETY [F34.1] 03/29/2007  . Essential hypertension [I10] 03/29/2007  . GASTROESOPHAGEAL REFLUX DISEASE [K21.9] 03/29/2007  . ALLERGY [T78.40XA] 03/29/2007  . PROSTATITIS, HX OF [Z87.448] 03/29/2007  . 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 Psychiatric: His mood appears anxious. His affect is not labile and not inappropriate. Thought content is not paranoid. He expresses no homicidal and no suicidal ideation.    Review of Systems  All other systems reviewed and are negative.   Blood pressure 113/51, pulse 87, temperature 98.6 F (37 C), temperature source Oral, resp. rate 16, height 5' 5.25" (1.657 m), weight 89.812 kg (198 lb).Body mass index is 32.71 kg/(m^2).  Have you used any form of tobacco in the last 30 days? (Cigarettes, Smokeless Tobacco, Cigars, and/or Pipes): Yes  Has this patient used any form of tobacco in the last 30 days? (Cigarettes, Smokeless Tobacco, Cigars, and/or Pipes) N/A  Past Medical History:  Past Medical History  Diagnosis Date  . Edema   . Dermatophytosis of groin and perianal area   . CAD (coronary artery disease)     cath '06, repeat '08 non-obstructive disease  . GERD (gastroesophageal reflux disease)   . Hyperlipidemia   . Allergy   . History of prostatitis   . History of BPH   . Hypertension   . Anxiety   . Depression   . Myocardial infarction ~ 08/2014    Past Surgical History  Procedure Laterality Date  . Tonsillectomy and adenoidectomy    . Coronary angioplasty with stent placement  ~ 08/2014    "1 stent"  . Cardiac catheterization  05/07/2005   . Cardiac catheterization N/A 03/20/2015    Procedure: Left Heart Cath and Coronary Angiography;  Surgeon: Lyn Records, MD;  Location: [facility] INVASIVE CV LAB;  Service: Cardiovascular;  Laterality: N/A;   Family History:  Family History  Problem Relation Age of Onset  . Stroke Mother   . Coronary artery disease Father   . Heart attack  Father   . Cancer Neg Hx     colon or prostate cancer   Social History:  History  Alcohol Use  . Yes    Comment: ocassionally     History  Drug Use No    Social History   Social History  . Marital Status: Divorced    Spouse Name: N/A  . Number of Children: N/A  . Years of  Education: N/A   Social History Main Topics  . Smoking status: Former Smoker    Types: Cigars  . Smokeless tobacco: Never Used     Comment: quit smoking in 2010  . Alcohol Use: Yes     Comment: ocassionally  . Drug Use: No  . Sexual Activity:    Partners: Female   Other Topics Concern  . None   Social History Narrative   Retired Publishing copy- Aeronautical engineer; remains professionally active   Married 1yrs- divorce- in process (3/10), lives alone   Children-Son   Risk to Self: Is patient at risk for suicide?: Yes What has been your use of drugs/alcohol within the last 12 months?: was a social drinker, "2 glasses of red wine/day" Risk to Others:   Prior Inpatient Therapy:   Prior Outpatient Therapy:    Level of Care:  OP  Hospital Course:  [name] [name], 69 y.o. male, presented to [facility] due tochest pain and angina. During admission, he reported feeling depressed, anxious,and havingsuicidal ideations.  Patient reported long history of depression and reported that due to chronic differences with his adult children, he had decided to move away to [facility].  He was feeling more depressed recently and decided to return, but states his expectation that his son would " forgive the past " did not go as planned, leading to even worse depression.  He reported that he had been non-complaint with his psych meds several weeks ago due to his worsening depression.    [name] [name] was admitted for MDD (major depressive disorder), recurrent episode, moderate (HCC) and crisis management.  He was treated discharged with the medications listed below under Medication List.  Medical problems were identified and treated as needed.  Home medications were restarted as appropriate.  Improvement was monitored by observation and [name] [name] daily report of symptom reduction.  Emotional and mental status was monitored by daily self-inventory reports completed by [name] [name] and clinical staff.          [name] [name] was evaluated by the treatment team for stability and plans for continued recovery upon discharge.  [name] [name] motivation was an integral factor for scheduling further treatment.  Employment, transportation, bed availability, health status, family support, and any pending legal issues were also considered during his hospital stay.  He was offered further treatment options upon discharge including but not limited to Residential, Intensive Outpatient, and Outpatient treatment.  [name] [name] will follow up with the services as listed below under Follow Up Information.     Upon completion of this admission the patient was both mentally and medically stable for discharge denying suicidal/homicidal ideation, auditory/visual/tactile hallucinations, delusional thoughts and paranoia.     Consults:  psychiatry  Significant Diagnostic Studies:  Labs per ED  Discharge Vitals:   Blood pressure 113/51, pulse 87, temperature 98.6 F (37 C), temperature source Oral, resp. rate 16, height 5' 5.25" (1.657 m), weight 89.812 kg (198 lb). Body mass index is 32.71 kg/(m^2). Lab Results:   No results found for this or any previous visit (from the past 72  hour(s)).  Physical Findings: AIMS: Facial and Oral Movements Muscles of Facial Expression: None, normal Lips and Perioral Area: None, normal Jaw: None, normal Tongue: None, normal,Extremity Movements Upper (arms, wrists, hands, fingers): None, normal Lower (legs, knees, ankles, toes): None, normal, Trunk Movements Neck, shoulders, hips: None, normal, Overall Severity Severity of abnormal movements (highest score from questions above): None, normal Incapacitation due to abnormal movements: None, normal Patient's awareness of abnormal movements (rate only patient's report): No Awareness, Dental Status Current problems with teeth and/or dentures?: No Does patient usually wear dentures?: No  CIWA:  CIWA-Ar Total: 1 COWS:  COWS Total Score: 2   See  Psychiatric Specialty Exam and Suicide Risk Assessment completed by Attending Physician prior to discharge.  Discharge destination:  Home  Is patient on multiple antipsychotic therapies at discharge:  No   Has Patient had three or more failed trials of antipsychotic monotherapy by history:  No  Recommended Plan for Multiple Antipsychotic Therapies: NA    Medication List    STOP taking these medications        ALPRAZolam 0.5 MG tablet  Commonly known as:  XANAX     ALPRAZolam 2 MG 24 hr tablet  Commonly known as:  ALPRAZOLAM XR     LEVITRA 20 MG tablet  Generic drug:  vardenafil      TAKE these medications      Indication   aspirin 81 MG EC tablet  Take 1 tablet (81 mg total) by mouth daily.   Indication:  cardiac health     atorvastatin 40 MG tablet  Commonly known as:  LIPITOR  Take 1 tablet (40 mg total) by mouth daily.   Indication:  high cholesterol     busPIRone 15 MG tablet  Commonly known as:  BUSPAR  Take 1 tablet (15 mg total) by mouth 3 (three) times daily.   Indication:  Anxiety Disorder     clopidogrel 75 MG tablet  Commonly known as:  PLAVIX  Take 1 tablet (75 mg total) by mouth daily.   Indication:  Heart Attack     DULoxetine 30 MG capsule  Commonly known as:  CYMBALTA  Take 3 capsules (90 mg total) by mouth daily.   Indication:  Major Depressive Disorder     DULoxetine 60 MG capsule  Commonly known as:  CYMBALTA  Take 1 capsule (60 mg total) by mouth 2 (two) times daily.   Indication:  Major Depressive Disorder     finasteride 5 MG tablet  Commonly known as:  PROSCAR  Take 1 tablet (5 mg total) by mouth daily.   Indication:  Enlarged Prostate     isosorbide mononitrate 30 MG 24 hr tablet  Commonly known as:  IMDUR  Take 1 tablet (30 mg total) by mouth daily.   Indication:  Hypertension     Metoprolol Tartrate 75 MG Tabs  Take 75 mg by mouth 2 (two) times daily.   Indication:  High Blood Pressure     nitroGLYCERIN 0.4 MG SL tablet   Commonly known as:  NITROSTAT  Place 1 tablet (0.4 mg total) under the tongue every 5 (five) minutes as needed for chest pain.   Indication:  Acute Angina Pectoris     pantoprazole 40 MG tablet  Commonly known as:  PROTONIX  Take 1 tablet (40 mg total) by mouth daily at 12 noon.   Indication:  Gastroesophageal Reflux Disease     potassium chloride SA 20 MEQ tablet  Commonly known as:  K-DUR,KLOR-CON  Take 1 tablet (20 mEq total) by mouth daily.   Indication:  Low Amount of Potassium in the Blood     tamsulosin 0.4 MG Caps capsule  Commonly known as:  FLOMAX  Take 2 capsules (0.8 mg total) by mouth daily.   Indication:  Enlarged Prostate     traZODone 50 MG tablet  Commonly known as:  DESYREL  Take 0.5 tablets (25 mg total) by mouth at bedtime and may repeat dose one time if needed.   Indication:  Trouble Sleeping           Follow-up Information    Follow up with [facility] On 04/17/2015.   Specialty:  Internal Medicine   Why:  at 9:00am with Marcos Eke. Please arrive 15 minutes early to complete paperwork. This appointment will also serve as a medication management appointment for your psychiatric meds until your appointment with Dr. Lolly Mustache at [facility] information:   [address] [place] [address] [phone]      Follow up with [facility] On 05/14/2015.   Specialty:  Behavioral Health   Why:  at 10:15am with Dr. Lolly Mustache for medication management.   Contact information:   [address] [place] [address] [phone]      Call [facility].   Why:  Call Hanley Ben at [phone] ext [phone]. She was given your contact information and will be calling you next week to discuss options   Contact information:   [address], [place], [place] [address] Phone: [phone]      Follow-up recommendations:  Activity:  as tol Diet:   as tol  Comments:  1.  Take all your medications as prescribed.              2.  Report any adverse side effects to outpatient provider.                       3.  Patient instructed to not use alcohol or illegal drugs while on prescription medicines.            4.  In the event of worsening symptoms, instructed patient to call 911, the crisis hotline or go to nearest emergency room for evaluation of symptoms.  Total Discharge Time:  30 min  Signed: Velna Hatchet May Agustin AGNP-BC 04/09/2015, 1:37 PM   Patient seen, Suicide Assessment Completed.  Disposition Plan Reviewed

## 2015-04-09 NOTE — Progress Notes (Signed)
  [facility] Adult Case Management Discharge Plan :  Will you be returning to the same living situation after discharge:  No. Patient plans to discharge to [facility] home [phone]) At discharge, do you have transportation home?: Yes,  group home to provide transport Do you have the ability to pay for your medications: Yes,  patient will be provided with any necessary prescriptions  Release of information consent forms completed and in the chart;  Patient's signature needed at discharge.  Patient to Follow up at: Follow-up Information    Follow up with [facility] On 04/17/2015.   Specialty:  Internal Medicine   Why:  at 9:00am with Marcos Eke. Please arrive 15 minutes early to complete paperwork. This appointment will also serve as a medication management appointment for your psychiatric meds until your appointment with Dr. Lolly Mustache at [facility] information:   [address] [place] [address] [phone]      Follow up with [facility] On 05/14/2015.   Specialty:  Behavioral Health   Why:  at 10:15am with Dr. Lolly Mustache for medication management.   Contact information:   [address] [place] [address] [phone]      Call [facility].   Why:  Call Hanley Ben at [phone] ext [phone]. She was given your contact information and will be calling you next week to discuss options   Contact information:   [address], [place], [place] [address] Phone: [phone]      Patient denies SI/HI: Yes,  denies    Safety Planning and Suicide Prevention discussed: Yes,  with patient   Have you used any form of tobacco in the last 30 days? (Cigarettes, Smokeless Tobacco, Cigars, and/or Pipes): Yes  Has patient been referred to the Quitline?: Patient refused referral  Brennan Karam, [place] 04/09/2015, 10:25 AM

## 2015-04-09 NOTE — [facility] Suicide Risk Assessment (Signed)
[facility] Discharge Suicide Risk Assessment   Demographic Factors:  69 year old male , currently homeless, unemployed   Total Time spent with patient: 30 minutes  Musculoskeletal: Strength & Muscle Tone: within normal limits Gait & Station: normal Patient leans: N/A  Psychiatric Specialty Exam: Physical Exam  ROS  Blood pressure 113/51, pulse 87, temperature 98.6 F (37 C), temperature source Oral, resp. rate 16, height 5' 5.25" (1.657 m), weight 198 lb (89.812 kg).Body mass index is 32.71 kg/(m^2).  General Appearance: Fairly Groomed  Patent attorney::  Good  Speech:  Normal Rate409  Volume:  Normal  Mood:  less depressed   Affect:  more reactive, still anxious, but to lesser degree   Thought Process:  Linear  Orientation:  Full (Time, Place, and Person)  Thought Content:  no hallucinations, no delusions  Suicidal Thoughts:  No- denies any thoughts of hurting self or of SI  Homicidal Thoughts:  No- denies any thoughts of hurting anyone else or any HI, specifically also denies any thoughts of violence towards son  Memory:  recent and remote grossly intact   Judgement:  Other:  improved   Insight:  improved   Psychomotor Activity:  Normal  Concentration:  Good  Recall:  Good  Fund of Knowledge:Good  Language: Good  Akathisia:  Negative  Handed:  Right  AIMS (if indicated):     Assets:  Communication Skills Desire for Improvement Resilience  Sleep:  Number of Hours: 5.25  Cognition: WNL  ADL's:  Fair    Have you used any form of tobacco in the last 30 days? (Cigarettes, Smokeless Tobacco, Cigars, and/or Pipes): Yes  Has this patient used any form of tobacco in the last 30 days? (Cigarettes, Smokeless Tobacco, Cigars, and/or Pipes) No  Mental Status Per Nursing Assessment::   On Admission:  Self-harm thoughts  Current Mental Status by Physician: At this time patient is improved compared to admission- mood is less depressed, affect is still anxious, but improved compared to  recent days, no thought disorder, no suicidal or homicidal ideations, more future oriented than he has been , and  Less anxious about transitioning to a Group Home setting, denies any hallucinations, no delusions.  Loss Factors:  homelessness, unemployment , poor relationship with adult son. Upcoming court date .  Of note, patient had been quite anxious and ruminative about court date, as he felt it was  Scheduled for tomorrow- with patient's express consent CSW contacted court system and found out that court date is in late October. Patient was quite reassured after this information obtained.   Historical Factors: Depression.   Risk Reduction Factors:   Positive coping skills or problem solving skills  Continued Clinical Symptoms:   Patient improved compared to admission- less  depressed , more reactive affect, less severely anxious, less ruminative, particularly after finding out his upcoming court date is not scheduled for tomorrow but for later this month. No SI or HI, no psychotic symptoms. Over recent days he had been very anxious , fearful of transitioning to group home setting , discharging from hospital, but this gradually improved with feedback , support, reassurance, and today felt more optimistic and ready for discharge .  Cognitive Features That Contribute To Risk:  No gross cognitive deficits noted upon discharge. Is alert , attentive, and oriented x 3   Suicide Risk:  Mild:  Suicidal ideation of limited frequency, intensity, duration, and specificity.  There are no identifiable plans, no associated intent, mild dysphoria and related symptoms, good  self-control (both objective and subjective assessment), few other risk factors, and identifiable protective factors, including available and accessible social support.  Principal Problem: MDD (major depressive disorder), recurrent episode, moderate (HCC) Discharge Diagnoses:  Patient Active Problem List   Diagnosis Date Noted  . MDD  (major depressive disorder), recurrent episode, moderate (HCC) [F33.1] 03/27/2015  . Major depressive disorder, recurrent episode, moderate (HCC) [F33.1] 03/25/2015  . Midsternal chest pain [R07.2] 03/23/2015  . BPH (benign prostatic hypertrophy) [N40.0] 03/23/2015  . Homelessness [Z59.0] 03/23/2015  . Elevated troponin [R79.89] 03/23/2015  . Hypokalemia [E87.6] 03/22/2015  . Hydronephrosis [N13.30] 03/22/2015  . Urinary retention due to benign prostatic hyperplasia [N28.89, R33.8] 03/22/2015  . Unstable angina (HCC) [I20.0] 03/19/2015  . Chest pain [R07.9] 03/19/2015  . Abnormal nuclear cardiac imaging test [R93.1] 03/19/2015  . OBESITY, CLASS III [E66.01] 09/04/2008  . EDEMA [R60.9] 08/10/2008  . DERMATOPHYTOSIS OF GROIN AND PERIANAL AREA [B35.6] 12/28/2007  . CAD S/P percutaneous coronary angioplasty - h/p PCI to RCA; Moderate LAD lesion with FFR 0.85 (med Rx) [I25.10, Z98.61] 10/06/2007  . Dyslipidemia [E78.5] 03/29/2007  . DEPRESSION/ANXIETY [F34.1] 03/29/2007  . Essential hypertension [I10] 03/29/2007  . GASTROESOPHAGEAL REFLUX DISEASE [K21.9] 03/29/2007  . ALLERGY [T78.40XA] 03/29/2007  . PROSTATITIS, HX OF [Z87.448] 03/29/2007  . 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   Specialty:  Internal Medicine   Why:  at 9:00am with Marcos Eke. Please arrive 15 minutes early to complete paperwork. This appointment will also serve as a medication management appointment for your psychiatric meds until your appointment with Dr. Lolly Mustache at [facility] information:   [address] [place] [address] [phone]      Follow up with [facility] On 05/14/2015.   Specialty:  Behavioral Health   Why:  at 10:15am with Dr. Lolly Mustache for medication management.   Contact information:   [address] [place] [address] [phone]      Call [facility].   Why:  Call Hanley Ben at [phone] ext [phone]. She was given your contact information and will be calling you next week to discuss options   Contact information:   [address], [place], [place] [address] Phone: [phone]      Plan Of Care/Follow-up recommendations:  Activity:  as tolerated Diet:  heart healthy Tests:  NA Other:  see below  Is patient on multiple antipsychotic therapies at discharge:  No   Has Patient had three or more failed trials of antipsychotic monotherapy by history:  No  Recommended Plan for Multiple Antipsychotic Therapies: NA  Patient going to live in a Group  Home. Patient plans to follow up as above . He is planning to see his PCP soon, and plans to follow up with Urologist for management of his prostate disorder /urinary symptoms  Oronde Hallenbeck 04/09/2015, 4:59 PM

## 2015-04-16 ENCOUNTER — Encounter: Payer: Self-pay | Admitting: Physician Assistant

## 2015-04-16 ENCOUNTER — Ambulatory Visit (INDEPENDENT_AMBULATORY_CARE_PROVIDER_SITE_OTHER): Payer: Medicare PPO | Admitting: Physician Assistant

## 2015-04-16 VITALS — BP 132/80 | HR 80 | Ht 66.0 in | Wt 201.9 lb

## 2015-04-16 DIAGNOSIS — Z79899 Other long term (current) drug therapy: Secondary | ICD-10-CM | POA: Diagnosis not present

## 2015-04-16 DIAGNOSIS — Z09 Encounter for follow-up examination after completed treatment for conditions other than malignant neoplasm: Secondary | ICD-10-CM | POA: Diagnosis not present

## 2015-04-16 DIAGNOSIS — R072 Precordial pain: Secondary | ICD-10-CM | POA: Diagnosis not present

## 2015-04-16 LAB — BASIC METABOLIC PANEL
BUN: 15 mg/dL (ref 7–25)
CALCIUM: 9.1 mg/dL (ref 8.6–10.3)
CO2: 27 mmol/L (ref 20–31)
Chloride: 102 mmol/L (ref 98–110)
Creat: 0.94 mg/dL (ref 0.70–1.25)
GLUCOSE: 94 mg/dL (ref 65–99)
POTASSIUM: 4.2 mmol/L (ref 3.5–5.3)
SODIUM: 140 mmol/L (ref 135–146)

## 2015-04-16 MED ORDER — CYANOCOBALAMIN 1000 MCG PO TABS: 500.0000 ug | ORAL_TABLET | Freq: Two times a day (BID) | ORAL | Status: AC

## 2015-04-16 MED ORDER — ATORVASTATIN CALCIUM 40 MG PO TABS: 40.0000 mg | ORAL_TABLET | Freq: Every day | ORAL | Status: AC

## 2015-04-16 MED ORDER — METOPROLOL TARTRATE 50 MG PO TABS: 50.0000 mg | ORAL_TABLET | Freq: Two times a day (BID) | ORAL | Status: AC

## 2015-04-16 NOTE — Progress Notes (Signed)
Cardiology Office Note   Date:  04/16/2015   ID:  [name] [name], DOB 1946-05-27, MRN [id number]  PCP:  [name], MD  Cardiologist:  Dr. Jens Som ( did the H&P 09/20)  Theodore Demark, PA-C   Chief Complaint  Patient presents with  . Follow-up    2-4 week post hosp//pt states he had some dizziness in psych hospital/Sunday he had light chest pressure and dizziness when he moved too quickly  . Edema    bilateral ankles/legs; mostly in left ankle/leg    History of Present Illness: [name] [name] is a 69 y.o. male with a history of hypertension, hyperlipidemia, obesity, depression and CAD s/p Promus DES to mRCA (2016 in [place]). Admit 09/20>>09/28 with [facility], cath without obstructive disease, d/c to [facility] for depression. Currently in a facility with a caregiver.  [name] [name] presents for post-Hospital follow-up.  Since discharge from the hospital in the facility, he has been doing pretty well. He states just after he left the hospital he had a couple of episodes of mild chest pain that resolved without intervention. At the time he felt his anxiety level was pretty high and thought that he might be anxious. He did not take nitroglycerin and the symptoms resolved. Since then, he has been able to increase his activity and denies any significant chest pain or shortness of breath. He is not very active, but the facility is looking for ways for him to increase his activity. He has several questions.  1. he's not sure which medicines he is supposed to be taking and there are discrepancies.  2. He wants to know if his urinary issues are related to his heart and if he should see his urologist.  3. He wants to note that okay for him to increase his activity  4. He needs refills on some of his medications  5. He was totime to check his potassium level again  Past Medical History  Diagnosis Date  . Edema   . Dermatophytosis of groin and perianal area   . CAD (coronary artery disease)    cath '06, repeat '08 non-obstructive disease  . GERD (gastroesophageal reflux disease)   . Hyperlipidemia   . Allergy   . History of prostatitis   . History of BPH   . Hypertension   . Anxiety   . Depression   . Myocardial infarction ~ 08/2014    Past Surgical History  Procedure Laterality Date  . Tonsillectomy and adenoidectomy    . Coronary angioplasty with stent placement  ~ 08/2014    "1 stent"  . Cardiac catheterization  05/07/2005   . Cardiac catheterization N/A 03/20/2015    Procedure: Left Heart Cath and Coronary Angiography;  Surgeon: Lyn Records, MD;  Location: [facility] INVASIVE CV LAB;  Service: Cardiovascular;  Laterality: N/A;    Current Outpatient Prescriptions  Medication Sig Dispense Refill  . aspirin 81 MG EC tablet Take 1 tablet (81 mg total) by mouth daily. 30 tablet 12  . busPIRone (BUSPAR) 15 MG tablet Take 1 tablet (15 mg total) by mouth 3 (three) times daily. 90 tablet 0  . clopidogrel (PLAVIX) 75 MG tablet Take 1 tablet (75 mg total) by mouth daily. 30 tablet 11  . cyanocobalamin 1000 MCG tablet Take 500 mcg by mouth 2 (two) times daily.    . DULoxetine (CYMBALTA) 60 MG capsule Take 1 capsule (60 mg total) by mouth 2 (two) times daily. 60 capsule 0  . finasteride (PROSCAR) 5 MG tablet  Take 1 tablet (5 mg total) by mouth daily. 30 tablet 0  . lisinopril (PRINIVIL,ZESTRIL) 10 MG tablet Take 10 mg by mouth daily.  0  . metoprolol (LOPRESSOR) 50 MG tablet Take 2 tablets by mouth daily.  3  . nitroGLYCERIN (NITROSTAT) 0.4 MG SL tablet Place 1 tablet (0.4 mg total) under the tongue every 5 (five) minutes as needed for chest pain. 25 tablet 3  . pantoprazole (PROTONIX) 40 MG tablet Take 1 tablet (40 mg total) by mouth daily at 12 noon. 30 tablet 11  . tamsulosin (FLOMAX) 0.4 MG CAPS capsule Take 2 capsules (0.8 mg total) by mouth daily. 30 capsule 0  . traZODone (DESYREL) 50 MG tablet Take 0.5 tablets (25 mg total) by mouth at bedtime and may repeat dose one time if  needed. 30 tablet 0   No current facility-administered medications for this visit.    Allergies:   Aspirin    Social History:  The patient  reports that he has quit smoking. His smoking use included Cigars. He has never used smokeless tobacco. He reports that he drinks alcohol. He reports that he does not use illicit drugs.   Family History:  The patient's family history includes Coronary artery disease in his father; Heart attack in his father; Stroke in his mother. There is no history of Cancer.    ROS:  Please see the history of present illness. All other systems are reviewed and negative.    PHYSICAL EXAM: VS:  BP 132/80 mmHg  Pulse 80  Ht  (1.676 m)  Wt 201 lb 14.4 oz (91.581 kg)  BMI 32.60 kg/m2 , BMI Body mass index is 32.6 kg/(m^2). GEN: Well nourished, well developed, male in no acute distress HEENT: normal for age  Neck: no JVD, no carotid bruit, no masses Cardiac: RRR; no clinically significantmurmur, no rubs, or gallops Respiratory:  clear to auscultation bilaterally, normal work of breathing GI: soft, nontender, nondistended, + BS MS: no deformity or atrophy; trace lower extremity edema; distal pulses are 2+ in all 4 extremities  Skin: warm and dry, no rash Neuro:  Strength and sensation are intact, there is a fine bilateral upper extremity tremor Psych: euthymic mood, full affect   EKG:  EKG is not ordered today.  Cardiac Cath: 03/20/2015 1. Prox Cx to Mid Cx lesion, 20% stenosed. 2. 1st Mrg lesion, 55% stenosed. 3. Prox LAD to Mid LAD lesion, 75% stenosed. 4. Mid LAD lesion, 20% stenosed.  Widely patent right coronary stent.  Intermediate stenosis in the proximal to mid LAD, with antegrade appearance of 70-80% obstruction. FFR however was 0.85 suggesting hemodynamic insignificance  Irregularities noted in the mid circumflex. No significant obstruction is seen.  Overall normal LV function  Recent Labs: 03/19/2015: Magnesium 1.7 03/20/2015: ALT  7* 03/25/2015: BUN 8; Creatinine, Ser 0.92; Potassium 4.4; Sodium 139 03/29/2015: Hemoglobin 9.5*; Platelets 300    Lipid Panel    Component Value Date/Time   CHOL 204* 03/20/2015 0443   TRIG 155* 03/20/2015 0443   HDL 29* 03/20/2015 0443   CHOLHDL 7.0 03/20/2015 0443   VLDL 31 03/20/2015 0443   LDLCALC 144* 03/20/2015 0443   LDLDIRECT 108.5 07/21/2011 1230     Wt Readings from Last 3 Encounters:  04/16/15 201 lb 14.4 oz (91.581 kg)  03/27/15 198 lb (89.812 kg)  03/26/15 188 lb (85.276 kg)     Other studies Reviewed: Additional studies/ records that were reviewed today include: Hospital records including ECG and cath reports.  ASSESSMENT  AND PLAN:  1.  Chest pain: Advised him that he only had one partial blockage at his recent cath in the blockage is not causing him chest pain. His heart muscle function is normal. Reviewed his medications and made changes were appropriate. He describes some fatigue, but his activity level has been poor. He is encouraged to increase his activity level and see how he does with that. He is also encouraged not to overdo it and is requested to keep his exertion level at a 3 or 4/10.   If he gets prolonged chest pain not relieved by nitroglycerin he is encouraged to contact us, but if he gets brief chest pain that resolves without intervention, he does not need to let us know.  Current medicines are reviewed at length with the patient today.  The patient has concerns regarding medicines.QUESTIONS were answered.  The following changes have been made:  He was not taking atorvastatin, and did not have a prescription for sublingual nitroglycerin.   He requests a prescription for B-12 and this was given to him. He also requests a prescription for Protonix and this was given. He is currently taking a lower dose of metoprolol then was on his previous medication list and his blood pressure is well controlled so he is to continue this dose.  Labs/ tests ordered  today include:   Orders Placed This Encounter  Procedures  . Basic metabolic panel     Disposition:   FU with Dr. Jens Somrenshaw in 3 months  Signed, Leanna BattlesBarrett, Rhonda, PA-C  04/16/2015 3:57 PM    [facility] [address], [place], [place]  [address] Phone: [phone]) [phone]; Fax: [phone]

## 2015-04-16 NOTE — Patient Instructions (Signed)
Your physician recommends that you return for lab work TODAY.  Your physician recommends that you schedule a follow-up appointment in: 3 months with Dr. Jens Somrenshaw.

## 2015-04-17 ENCOUNTER — Ambulatory Visit: Payer: Self-pay | Admitting: Family

## 2015-04-26 ENCOUNTER — Telehealth: Payer: Self-pay | Admitting: Emergency Medicine

## 2015-04-26 NOTE — Telephone Encounter (Signed)
Encounter was opened under wrong provider - should be listed as Theodore Demarkhonda Barrett, PA  LM for patient that flomax is a non-cardiac medication and she should contact his PCP for this med refill.

## 2015-04-26 NOTE — Telephone Encounter (Signed)
°  STAT if patient is at the pharmacy , call can be transferred to refill team.   1. Which medications need to be refilled? flomax  2. Which pharmacy/location is [facility] sent to?[facility] [address]   3. Do they need a 30 day or 90 day supply? 90

## 2015-04-29 ENCOUNTER — Telehealth: Payer: Self-pay | Admitting: Family

## 2015-04-29 ENCOUNTER — Ambulatory Visit: Payer: Self-pay | Admitting: Family

## 2015-04-29 ENCOUNTER — Other Ambulatory Visit: Payer: Self-pay | Admitting: Family

## 2015-04-29 MED ORDER — TAMSULOSIN HCL 0.4 MG PO CAPS: 0.8000 mg | ORAL_CAPSULE | Freq: Every day | ORAL | Status: DC

## 2015-04-29 NOTE — Telephone Encounter (Signed)
Notified patient.

## 2015-04-29 NOTE — Telephone Encounter (Signed)
Noted. Medication sent to pharmacy.   

## 2015-04-29 NOTE — Telephone Encounter (Signed)
Patient missed appt today because he had social service at house.  He is requesting enough flomax 0.4 g caps (2 per day) to [facility] [address], to get him through until his appt on the 10th.

## 2015-05-09 ENCOUNTER — Ambulatory Visit (INDEPENDENT_AMBULATORY_CARE_PROVIDER_SITE_OTHER): Payer: Medicare PPO | Admitting: Family

## 2015-05-09 ENCOUNTER — Encounter: Payer: Self-pay | Admitting: Family

## 2015-05-09 VITALS — BP 138/82 | HR 75 | Temp 98.1°F | Resp 18 | Ht 66.0 in | Wt 205.1 lb

## 2015-05-09 DIAGNOSIS — I10 Essential (primary) hypertension: Secondary | ICD-10-CM | POA: Diagnosis not present

## 2015-05-09 DIAGNOSIS — F331 Major depressive disorder, recurrent, moderate: Secondary | ICD-10-CM | POA: Diagnosis not present

## 2015-05-09 DIAGNOSIS — N400 Enlarged prostate without lower urinary tract symptoms: Secondary | ICD-10-CM | POA: Diagnosis not present

## 2015-05-09 DIAGNOSIS — K219 Gastro-esophageal reflux disease without esophagitis: Secondary | ICD-10-CM

## 2015-05-09 DIAGNOSIS — I2510 Atherosclerotic heart disease of native coronary artery without angina pectoris: Secondary | ICD-10-CM | POA: Diagnosis not present

## 2015-05-09 DIAGNOSIS — Z9861 Coronary angioplasty status: Secondary | ICD-10-CM

## 2015-05-09 MED ORDER — TRAZODONE HCL 50 MG PO TABS: 25.0000 mg | ORAL_TABLET | Freq: Every evening | ORAL | Status: AC | PRN

## 2015-05-09 MED ORDER — DULOXETINE HCL 60 MG PO CPEP: 60.0000 mg | ORAL_CAPSULE | Freq: Two times a day (BID) | ORAL | Status: DC

## 2015-05-09 MED ORDER — LISINOPRIL 10 MG PO TABS: 10.0000 mg | ORAL_TABLET | Freq: Every day | ORAL | Status: AC

## 2015-05-09 MED ORDER — FINASTERIDE 5 MG PO TABS: 5.0000 mg | ORAL_TABLET | Freq: Every day | ORAL | Status: DC

## 2015-05-09 MED ORDER — BUSPIRONE HCL 15 MG PO TABS: 15.0000 mg | ORAL_TABLET | Freq: Three times a day (TID) | ORAL | Status: DC

## 2015-05-09 NOTE — Assessment & Plan Note (Signed)
Currently managed through treatment of risk factors including hypertension and dyslipidemia, blood pressure stable with current regimen. Anticoagulated for risk reduction Plavix. Obtain new lipid profile at [facility]. Continue current dosage of atorvastatin, clopidogrel, lisinopril, nitroglycerin and metoprolol.

## 2015-05-09 NOTE — Patient Instructions (Addendum)
Thank you for choosing [facility].  Summary/Instructions:  Your prescription(s) have been submitted to your pharmacy or been printed and provided for you. Please take as directed and contact our office if you believe you are having problem(s) with the medication(s) or have any questions.  If your symptoms worsen or fail to improve, please contact our office for further instruction, or in case of emergency go directly to the emergency room at the closest medical facility.   Please continue to take your medications as prescribed.   Please follow up for a physical at your convenience.

## 2015-05-09 NOTE — Progress Notes (Signed)
Subjective:    Patient ID: [name] MeadJohn Myhand, male    DOB: [birth year], 69 y.o.   MRN: [id number]  Chief Complaint  Patient presents with  . Establish Care    can not see his psychiatrist until february and needs refill of all meds    HPI:  [name] MeadJohn Belford is a 69 y.o. male who  has a past medical history of Edema; Dermatophytosis of groin and perianal area; CAD (coronary artery disease); GERD (gastroesophageal reflux disease); Hyperlipidemia; Allergy; History of prostatitis; History of BPH; Hypertension; Anxiety; Depression; and Myocardial infarction (HCC) (~ 08/2014). and presents today for an office visit to establish care.  1.) BPH - Currently maintained on Floxmax and finasteride. Takes the medication as prescribed and denies adverse side effects. Currently experiences nocturia and a decreased flow. Previously followed by Dr. Warner Mccreedytlin of urology. Requesting referral back to urology for BPH and history of prostatitis.    2.) Hypertension - Currently maintained on metoprolol and lisinopril. Takes the medications as prescribed and denies adverse side effects. Does not currently take his blood pressure at home.   BP Readings from Last 3 Encounters:  05/09/15 138/82  04/16/15 132/80  04/09/15 113/51    3.) Depression - Currently managed by psychiatry and unable to see his psychiatrist until February. Maintained on buspirone, duloxetine, and trazodone. Takes the medication as prescribed and denies adverse side effects. Reports mood is stable with current regimen and denies suicidal ideations.   Allergies  Allergen Reactions  . Aspirin     Patient states can take low dose aspirin      Current Outpatient Prescriptions on File Prior to Visit  Medication Sig Dispense Refill  . aspirin 81 MG EC tablet Take 1 tablet (81 mg total) by mouth daily. 30 tablet 12  . atorvastatin (LIPITOR) 40 MG tablet Take 1 tablet (40 mg total) by mouth daily. 90 tablet 3  . clopidogrel (PLAVIX) 75 MG tablet Take 1 tablet  (75 mg total) by mouth daily. 30 tablet 11  . cyanocobalamin 1000 MCG tablet Take 0.5 tablets (500 mcg total) by mouth 2 (two) times daily. 30 tablet 6  . metoprolol (LOPRESSOR) 50 MG tablet Take 1 tablet (50 mg total) by mouth 2 (two) times daily. 60 tablet 6  . nitroGLYCERIN (NITROSTAT) 0.4 MG SL tablet Place 1 tablet (0.4 mg total) under the tongue every 5 (five) minutes as needed for chest pain. 25 tablet 3  . pantoprazole (PROTONIX) 40 MG tablet Take 1 tablet (40 mg total) by mouth daily at 12 noon. 30 tablet 11  . tamsulosin (FLOMAX) 0.4 MG CAPS capsule Take 2 capsules (0.8 mg total) by mouth daily. 30 capsule 0   No current facility-administered medications on file prior to visit.     Past Surgical History  Procedure Laterality Date  . Tonsillectomy and adenoidectomy    . Coronary angioplasty with stent placement  ~ 08/2014    "1 stent"  . Cardiac catheterization  05/07/2005   . Cardiac catheterization N/A 03/20/2015    Procedure: Left Heart Cath and Coronary Angiography;  Surgeon: Lyn RecordsHenry W Smith, MD;  [facility];  Service: Cardiovascular;  Laterality: N/A;      Review of Systems  Constitutional: Negative for fever and chills.  Eyes:       Negative for changes in vision.  Respiratory: Negative for chest tightness and shortness of breath.   Cardiovascular: Negative for chest pain, palpitations and leg swelling.  Genitourinary:  Positive for nocturia  Neurological: Negative for headaches.      Objective:    BP 138/82 mmHg  Pulse 75  Temp(Src) 98.1 F (36.7 C) (Oral)  Resp 18  Ht  (1.676 m)  Wt 205 lb 1.9 oz (93.042 kg)  BMI 33.12 kg/m2  SpO2 97% Nursing note and vital signs reviewed.  Physical Exam  Constitutional: He is oriented to person, place, and time. He appears well-developed and well-nourished. No distress.  Cardiovascular: Normal rate, regular rhythm, normal heart sounds and intact distal pulses.   Pulmonary/Chest: Effort normal  and breath sounds normal.  Neurological: He is alert and oriented to person, place, and time.  Skin: Skin is warm and dry.  Psychiatric: His behavior is normal. Judgment and thought content normal. His affect is labile.       Assessment & Plan:   Problem List Items Addressed This Visit      Cardiovascular and Mediastinum   Essential hypertension (Chronic)    Stable below goal of 140/90 with current regimen. Denies adverse side effects. Encouraged to take blood pressure at home. Continue current dosage of metoprolol and lisinopril.      Relevant Medications   lisinopril (PRINIVIL,ZESTRIL) 10 MG tablet   CAD S/P percutaneous coronary angioplasty - h/p PCI to RCA; Moderate LAD lesion with FFR 0.85 (med Rx) (Chronic)    Currently managed through treatment of risk factors including hypertension and dyslipidemia, blood pressure stable with current regimen. Anticoagulated for risk reduction Plavix. Obtain new lipid profile at [facility]. Continue current dosage of atorvastatin, clopidogrel, lisinopril, nitroglycerin and metoprolol.      Relevant Medications   lisinopril (PRINIVIL,ZESTRIL) 10 MG tablet     Digestive   GASTROESOPHAGEAL REFLUX DISEASE    Stable with current dosage of pantoprazole and denies adverse side effects. Continue current dosage of pantoprazole.        Genitourinary   BPH (benign prostatic hypertrophy) - Primary (Chronic)    BPH with worsening symptoms on current medications of finasteride and Flomax. Refer to urology for further assessment and workup. Recommend nonpharmacological interventions including timed voiding and decreasing fluid intake closer to bedtime. Depends as needed. Follow-up if symptoms worsen or fail to improve.      Relevant Medications   finasteride (PROSCAR) 5 MG tablet   Other Relevant Orders   Ambulatory referral to Urology     Other   MDD (major depressive disorder), recurrent episode, moderate (HCC)    Depression appears adequately  controlled with current regimen of alprazolam, trazodone, and duloxetine. Denies adverse side effects. Has follow-up with psychiatry in February. Denies suicidal ideation. Continue current dosage of alprazolam, trazodone, and duloxetine.      Relevant Medications   busPIRone (BUSPAR) 15 MG tablet   DULoxetine (CYMBALTA) 60 MG capsule   traZODone (DESYREL) 50 MG tablet

## 2015-05-09 NOTE — Assessment & Plan Note (Signed)
Stable with current dosage of pantoprazole and denies adverse side effects. Continue current dosage of pantoprazole.

## 2015-05-09 NOTE — Assessment & Plan Note (Signed)
Stable below goal of 140/90 with current regimen. Denies adverse side effects. Encouraged to take blood pressure at home. Continue current dosage of metoprolol and lisinopril.

## 2015-05-09 NOTE — Progress Notes (Signed)
Pre visit review using our clinic review tool, if applicable. No additional management support is needed unless otherwise documented below in the visit note. 

## 2015-05-09 NOTE — Assessment & Plan Note (Signed)
Depression appears adequately controlled with current regimen of alprazolam, trazodone, and duloxetine. Denies adverse side effects. Has follow-up with psychiatry in February. Denies suicidal ideation. Continue current dosage of alprazolam, trazodone, and duloxetine.

## 2015-05-09 NOTE — Assessment & Plan Note (Signed)
BPH with worsening symptoms on current medications of finasteride and Flomax. Refer to urology for further assessment and workup. Recommend nonpharmacological interventions including timed voiding and decreasing fluid intake closer to bedtime. Depends as needed. Follow-up if symptoms worsen or fail to improve.

## 2015-05-10 ENCOUNTER — Other Ambulatory Visit: Payer: Self-pay | Admitting: Family

## 2015-05-14 ENCOUNTER — Ambulatory Visit (HOSPITAL_COMMUNITY): Payer: Self-pay | Admitting: Psychiatry

## 2015-05-15 ENCOUNTER — Encounter: Payer: Self-pay | Admitting: Family

## 2015-05-16 ENCOUNTER — Ambulatory Visit (HOSPITAL_COMMUNITY): Payer: Self-pay | Admitting: Psychiatry

## 2015-05-17 ENCOUNTER — Telehealth: Payer: Self-pay | Admitting: Family

## 2015-05-17 NOTE — Telephone Encounter (Signed)
Ok with me 

## 2015-05-17 NOTE — Telephone Encounter (Signed)
Pt, [name],  was a long time patient of Dr. Debby BudNorins and has seen Dr. Macario GoldsPlot many years ago. He would like to start seeing Dr. Macario GoldsPlot as his PCP Please advise

## 2015-05-19 ENCOUNTER — Encounter (HOSPITAL_COMMUNITY): Payer: Self-pay | Admitting: Emergency Medicine

## 2015-05-19 ENCOUNTER — Emergency Department (HOSPITAL_COMMUNITY)
Admission: EM | Admit: 2015-05-19 | Discharge: 2015-05-19 | Disposition: A | Discharge status: Home / Self Care | Payer: Medicare PPO | Attending: Emergency Medicine | Admitting: Emergency Medicine

## 2015-05-19 ENCOUNTER — Emergency Department (HOSPITAL_COMMUNITY): Payer: Medicare PPO

## 2015-05-19 DIAGNOSIS — F329 Major depressive disorder, single episode, unspecified: Secondary | ICD-10-CM | POA: Insufficient documentation

## 2015-05-19 DIAGNOSIS — N400 Enlarged prostate without lower urinary tract symptoms: Secondary | ICD-10-CM | POA: Insufficient documentation

## 2015-05-19 DIAGNOSIS — Z9889 Other specified postprocedural states: Secondary | ICD-10-CM | POA: Insufficient documentation

## 2015-05-19 DIAGNOSIS — F419 Anxiety disorder, unspecified: Secondary | ICD-10-CM | POA: Insufficient documentation

## 2015-05-19 DIAGNOSIS — Z5900 Homelessness unspecified: Secondary | ICD-10-CM

## 2015-05-19 DIAGNOSIS — I10 Essential (primary) hypertension: Secondary | ICD-10-CM | POA: Diagnosis not present

## 2015-05-19 DIAGNOSIS — Z79899 Other long term (current) drug therapy: Secondary | ICD-10-CM | POA: Diagnosis not present

## 2015-05-19 DIAGNOSIS — Z7982 Long term (current) use of aspirin: Secondary | ICD-10-CM | POA: Insufficient documentation

## 2015-05-19 DIAGNOSIS — E785 Hyperlipidemia, unspecified: Secondary | ICD-10-CM | POA: Diagnosis not present

## 2015-05-19 DIAGNOSIS — Z8619 Personal history of other infectious and parasitic diseases: Secondary | ICD-10-CM | POA: Diagnosis not present

## 2015-05-19 DIAGNOSIS — I2510 Atherosclerotic heart disease of native coronary artery without angina pectoris: Secondary | ICD-10-CM | POA: Diagnosis not present

## 2015-05-19 DIAGNOSIS — Z9861 Coronary angioplasty status: Secondary | ICD-10-CM | POA: Diagnosis not present

## 2015-05-19 DIAGNOSIS — R079 Chest pain, unspecified: Secondary | ICD-10-CM | POA: Insufficient documentation

## 2015-05-19 DIAGNOSIS — I252 Old myocardial infarction: Secondary | ICD-10-CM | POA: Diagnosis not present

## 2015-05-19 DIAGNOSIS — Z87891 Personal history of nicotine dependence: Secondary | ICD-10-CM | POA: Insufficient documentation

## 2015-05-19 DIAGNOSIS — K219 Gastro-esophageal reflux disease without esophagitis: Secondary | ICD-10-CM | POA: Diagnosis not present

## 2015-05-19 DIAGNOSIS — Z7902 Long term (current) use of antithrombotics/antiplatelets: Secondary | ICD-10-CM | POA: Diagnosis not present

## 2015-05-19 LAB — I-STAT CHEM 8, ED
BUN: 15 mg/dL (ref 6–20)
CHLORIDE: 100 mmol/L — AB (ref 101–111)
CREATININE: 0.9 mg/dL (ref 0.61–1.24)
Calcium, Ion: 1.19 mmol/L (ref 1.13–1.30)
GLUCOSE: 98 mg/dL (ref 65–99)
HEMATOCRIT: 38 % — AB (ref 39.0–52.0)
HEMOGLOBIN: 12.9 g/dL — AB (ref 13.0–17.0)
POTASSIUM: 4.1 mmol/L (ref 3.5–5.1)
Sodium: 140 mmol/L (ref 135–145)
TCO2: 25 mmol/L (ref 0–100)

## 2015-05-19 LAB — CBC WITH DIFFERENTIAL/PLATELET
Basophils Absolute: 0 10*3/uL (ref 0.0–0.1)
Basophils Relative: 0 %
EOS ABS: 0.1 10*3/uL (ref 0.0–0.7)
Eosinophils Relative: 1 %
HEMATOCRIT: 36.4 % — AB (ref 39.0–52.0)
HEMOGLOBIN: 11.8 g/dL — AB (ref 13.0–17.0)
LYMPHS ABS: 1 10*3/uL (ref 0.7–4.0)
LYMPHS PCT: 15 %
MCH: 29.1 pg (ref 26.0–34.0)
MCHC: 32.4 g/dL (ref 30.0–36.0)
MCV: 89.9 fL (ref 78.0–100.0)
MONOS PCT: 5 %
Monocytes Absolute: 0.3 10*3/uL (ref 0.1–1.0)
NEUTROS PCT: 79 %
Neutro Abs: 5.6 10*3/uL (ref 1.7–7.7)
Platelets: 196 10*3/uL (ref 150–400)
RBC: 4.05 MIL/uL — AB (ref 4.22–5.81)
RDW: 13.7 % (ref 11.5–15.5)
WBC: 7 10*3/uL (ref 4.0–10.5)

## 2015-05-19 LAB — BASIC METABOLIC PANEL
Anion gap: 6 (ref 5–15)
BUN: 13 mg/dL (ref 6–20)
CHLORIDE: 103 mmol/L (ref 101–111)
CO2: 28 mmol/L (ref 22–32)
CREATININE: 0.97 mg/dL (ref 0.61–1.24)
Calcium: 9.2 mg/dL (ref 8.9–10.3)
GFR calc Af Amer: >60 mL/min (ref >60)
GFR calc non Af Amer: >60 mL/min (ref >60)
GLUCOSE: 101 mg/dL — AB (ref 65–99)
POTASSIUM: 4.2 mmol/L (ref 3.5–5.1)
Sodium: 137 mmol/L (ref 135–145)

## 2015-05-19 LAB — I-STAT TROPONIN, ED: Troponin i, poc: 0 ng/mL (ref 0.00–0.08)
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
## 2015-05-19 NOTE — ED Notes (Signed)
Pt reports nonproductive cough x1 week. Denies fever.
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
## 2015-05-19 NOTE — ED Notes (Signed)
Pt took 324 aspirin this morning per 911 operator.

## 2015-05-19 NOTE — ED Notes (Signed)
Pt wheeled to waiting room to wait for CSW to come speak with him regarding placement at group home.

## 2015-05-19 NOTE — Discharge Instructions (Signed)
Nonspecific Chest Pain  °Chest pain can be caused by many different conditions. There is always a chance that your pain could be related to something serious, such as a heart attack or a blood clot in your lungs. Chest pain can also be caused by conditions that are not life-threatening. If you have chest pain, it is very important to follow up with your health care provider. °CAUSES  °Chest pain can be caused by: °· Heartburn. °· Pneumonia or bronchitis. °· Anxiety or stress. °· Inflammation around your heart (pericarditis) or lung (pleuritis or pleurisy). °· A blood clot in your lung. °· A collapsed lung (pneumothorax). It can develop suddenly on its own (spontaneous pneumothorax) or from trauma to the chest. °· Shingles infection (varicella-zoster virus). °· Heart attack. °· Damage to the bones, muscles, and cartilage that make up your chest wall. This can include: °¨ Bruised bones due to injury. °¨ Strained muscles or cartilage due to frequent or repeated coughing or overwork. °¨ Fracture to one or more ribs. °¨ Sore cartilage due to inflammation (costochondritis). °RISK FACTORS  °Risk factors for chest pain may include: °· Activities that increase your risk for trauma or injury to your chest. °· Respiratory infections or conditions that cause frequent coughing. °· Medical conditions or overeating that can cause heartburn. °· Heart disease or family history of heart disease. °· Conditions or health behaviors that increase your risk of developing a blood clot. °· Having had chicken pox (varicella zoster). °SIGNS AND SYMPTOMS °Chest pain can feel like: °· Burning or tingling on the surface of your chest or deep in your chest. °· Crushing, pressure, aching, or squeezing pain. °· Dull or sharp pain that is worse when you move, cough, or take a deep breath. °· Pain that is also felt in your back, neck, shoulder, or arm, or pain that spreads to any of these areas. °Your chest pain may come and go, or it may stay  constant. °DIAGNOSIS °Lab tests or other studies may be needed to find the cause of your pain. Your health care provider may have you take a test called an ambulatory ECG (electrocardiogram). An ECG records your heartbeat patterns at the time the test is performed. You may also have other tests, such as: °· Transthoracic echocardiogram (TTE). During echocardiography, sound waves are used to create a picture of all of the heart structures and to look at how blood flows through your heart. °· Transesophageal echocardiogram (TEE). This is a more advanced imaging test that obtains images from inside your body. It allows your health care provider to see your heart in finer detail. °· Cardiac monitoring. This allows your health care provider to monitor your heart rate and rhythm in real time. °· Holter monitor. This is a portable device that records your heartbeat and can help to diagnose abnormal heartbeats. It allows your health care provider to track your heart activity for several days, if needed. °· Stress tests. These can be done through exercise or by taking medicine that makes your heart beat more quickly. °· Blood tests. °· Imaging tests. °TREATMENT  °Your treatment depends on what is causing your chest pain. Treatment may include: °· Medicines. These may include: °¨ Acid blockers for heartburn. °¨ Anti-inflammatory medicine. °¨ Pain medicine for inflammatory conditions. °¨ Antibiotic medicine, if an infection is present. °¨ Medicines to dissolve blood clots. °¨ Medicines to treat coronary artery disease. °· Supportive care for conditions that do not require medicines. This may include: °¨ Resting. °¨ Applying heat   or cold packs to injured areas. °¨ Limiting activities until pain decreases. °HOME CARE INSTRUCTIONS °· If you were prescribed an antibiotic medicine, finish it all even if you start to feel better. °· Avoid any activities that bring on chest pain. °· Do not use any tobacco products, including  cigarettes, chewing tobacco, or electronic cigarettes. If you need help quitting, ask your health care provider. °· Do not drink alcohol. °· Take medicines only as directed by your health care provider. °· Keep all follow-up visits as directed by your health care provider. This is important. This includes any further testing if your chest pain does not go away. °· If heartburn is the cause for your chest pain, you may be told to keep your head raised (elevated) while sleeping. This reduces the chance that acid will go from your stomach into your esophagus. °· Make lifestyle changes as directed by your health care provider. These may include: °¨ Getting regular exercise. Ask your health care provider to suggest some activities that are safe for you. °¨ Eating a heart-healthy diet. A registered dietitian can help you to learn healthy eating options. °¨ Maintaining a healthy weight. °¨ Managing diabetes, if necessary. °¨ Reducing stress. °SEEK MEDICAL CARE IF: °· Your chest pain does not go away after treatment. °· You have a rash with blisters on your chest. °· You have a fever. °SEEK IMMEDIATE MEDICAL CARE IF:  °· Your chest pain is worse. °· You have an increasing cough, or you cough up blood. °· You have severe abdominal pain. °· You have severe weakness. °· You faint. °· You have chills. °· You have sudden, unexplained chest discomfort. °· You have sudden, unexplained discomfort in your arms, back, neck, or jaw. °· You have shortness of breath at any time. °· You suddenly start to sweat, or your skin gets clammy. °· You feel nauseous or you vomit. °· You suddenly feel light-headed or dizzy. °· Your heart begins to beat quickly, or it feels like it is skipping beats. °These symptoms may represent a serious problem that is an emergency. Do not wait to see if the symptoms will go away. Get medical help right away. Call your local emergency services (911 in the [place] Do not drive yourself to the hospital. °  °This  information is not intended to replace advice given to you by your health care provider. Make sure you discuss any questions you have with your health care provider. °  °Document Released: 03/25/2005 Document Revised: 07/06/2014 Document Reviewed: 01/19/2014 °Elsevier Interactive Patient Education ©2016 [employer] ° °

## 2015-05-20 ENCOUNTER — Encounter: Payer: Self-pay | Admitting: Family

## 2015-05-20 NOTE — Telephone Encounter (Signed)
Unfortunately, I'm not able to accept any more new patients at this time.  I'm sorry! Thank you!  

## 2015-05-21 NOTE — Telephone Encounter (Signed)
Informed pt on vm °

## 2015-05-22 ENCOUNTER — Encounter: Payer: Self-pay | Admitting: Family

## 2015-05-27 ENCOUNTER — Emergency Department (HOSPITAL_COMMUNITY)
Admission: EM | Admit: 2015-05-27 | Discharge: 2015-05-27 | Disposition: A | Discharge status: Home / Self Care | Payer: Medicare PPO | Attending: Emergency Medicine | Admitting: Emergency Medicine

## 2015-05-27 ENCOUNTER — Encounter (HOSPITAL_COMMUNITY): Payer: Self-pay | Admitting: Emergency Medicine

## 2015-05-27 DIAGNOSIS — I2510 Atherosclerotic heart disease of native coronary artery without angina pectoris: Secondary | ICD-10-CM | POA: Insufficient documentation

## 2015-05-27 DIAGNOSIS — Z79899 Other long term (current) drug therapy: Secondary | ICD-10-CM | POA: Diagnosis not present

## 2015-05-27 DIAGNOSIS — K219 Gastro-esophageal reflux disease without esophagitis: Secondary | ICD-10-CM | POA: Insufficient documentation

## 2015-05-27 DIAGNOSIS — Z9861 Coronary angioplasty status: Secondary | ICD-10-CM | POA: Insufficient documentation

## 2015-05-27 DIAGNOSIS — I252 Old myocardial infarction: Secondary | ICD-10-CM | POA: Insufficient documentation

## 2015-05-27 DIAGNOSIS — F329 Major depressive disorder, single episode, unspecified: Secondary | ICD-10-CM | POA: Diagnosis not present

## 2015-05-27 DIAGNOSIS — E785 Hyperlipidemia, unspecified: Secondary | ICD-10-CM | POA: Diagnosis not present

## 2015-05-27 DIAGNOSIS — Z644 Discord with counselors: Secondary | ICD-10-CM | POA: Diagnosis not present

## 2015-05-27 DIAGNOSIS — Z658 Other specified problems related to psychosocial circumstances: Secondary | ICD-10-CM

## 2015-05-27 DIAGNOSIS — Z7902 Long term (current) use of antithrombotics/antiplatelets: Secondary | ICD-10-CM | POA: Diagnosis not present

## 2015-05-27 DIAGNOSIS — Z9889 Other specified postprocedural states: Secondary | ICD-10-CM | POA: Insufficient documentation

## 2015-05-27 DIAGNOSIS — F419 Anxiety disorder, unspecified: Secondary | ICD-10-CM | POA: Diagnosis present

## 2015-05-27 DIAGNOSIS — N400 Enlarged prostate without lower urinary tract symptoms: Secondary | ICD-10-CM | POA: Diagnosis not present

## 2015-05-27 DIAGNOSIS — Z7982 Long term (current) use of aspirin: Secondary | ICD-10-CM | POA: Insufficient documentation

## 2015-05-27 DIAGNOSIS — Z8619 Personal history of other infectious and parasitic diseases: Secondary | ICD-10-CM | POA: Diagnosis not present

## 2015-05-27 DIAGNOSIS — Z87891 Personal history of nicotine dependence: Secondary | ICD-10-CM | POA: Insufficient documentation

## 2015-05-27 NOTE — ED Notes (Signed)
Per [place] with Social Work, pt's ride is called and but they may not be here until 5pm.  She also reports that pt isnt happy about having to go back to group home.

## 2015-05-27 NOTE — Progress Notes (Signed)
CSW was consulted by EDP regarding discharge. CSW met with patient at bedside. Patient states that he is from a group home but does not want to return. Patient states that he wants to be seen by " Dr.Cabos", and that he is suppose to be seen at [facility].  CSW offered supportive counseling for patient. CSW informed patient that he has been cleared for discharge by EDP.    CSW explained to patient to patient that new placement cannot be found from [facility]. However, CSW can provide community resources for other group homes, and outpatient therapy.  Willette Brace [phone] ED CSW 05/27/2015 2:34 PM
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
## 2015-05-27 NOTE — Progress Notes (Signed)
CSW reached out to Mr.[facility] of  [facility] at [phone] and informed him that the patient is up for discharged and ready to be picked up. Mr.Bennett states that he will send a staff member to pick the patient up by 5:00pm.  CSW made physician aware. 05/27/2015 2:36 PM    Joselle Deeds Esmond CamperWhitaker, LCSWA [phone] ED CSW

## 2015-05-27 NOTE — Discharge Instructions (Signed)
Stress and Stress Management °Stress is a normal reaction to life events. It is what you feel when life demands more than you are used to or more than you can handle. Some stress can be useful. For example, the stress reaction can help you catch the last bus of the day, study for a test, or meet a deadline at work. But stress that occurs too often or for too long can cause problems. It can affect your emotional health and interfere with relationships and normal daily activities. Too much stress can weaken your immune system and increase your risk for physical illness. If you already have a medical problem, stress can make it worse. °CAUSES  °All sorts of life events may cause stress. An event that causes stress for one person may not be stressful for another person. Major life events commonly cause stress. These may be positive or negative. Examples include losing your job, moving into a new home, getting married, having a baby, or losing a loved one. Less obvious life events may also cause stress, especially if they occur day after day or in combination. Examples include working long hours, driving in traffic, caring for children, being in debt, or being in a difficult relationship. °SIGNS AND SYMPTOMS °Stress may cause emotional symptoms including, the following: °· Anxiety. This is feeling worried, afraid, on edge, overwhelmed, or out of control. °· Anger. This is feeling irritated or impatient. °· Depression. This is feeling sad, down, helpless, or guilty. °· Difficulty focusing, remembering, or making decisions. °Stress may cause physical symptoms, including the following:  °· Aches and pains. These may affect your head, neck, back, stomach, or other areas of your body. °· Tight muscles or clenched jaw. °· Low energy or trouble sleeping.  °Stress may cause unhealthy behaviors, including the following:  °· Eating to feel better (overeating) or skipping meals. °· Sleeping too little, too much, or both. °· Working  too much or putting off tasks (procrastination). °· Smoking, drinking alcohol, or using drugs to feel better. °DIAGNOSIS  °Stress is diagnosed through an assessment by your health care provider. Your health care provider will ask questions about your symptoms and any stressful life events. Your health care provider will also ask about your medical history and may order blood tests or other tests. Certain medical conditions and medicine can cause physical symptoms similar to stress.  Mental illness can cause emotional symptoms and unhealthy behaviors similar to stress. Your health care provider may refer you to a mental health professional for further evaluation.  °TREATMENT  °Stress management is the recommended treatment for stress. The goals of stress management are reducing stressful life events and coping with stress in healthy ways.  °Techniques for reducing stressful life events include the following: °· Stress identification. Self-monitor for stress and identify what causes stress for you. These skills may help you to avoid some stressful events. °· Time management. Set your priorities, keep a calendar of events, and learn to say "no." These tools can help you avoid making too many commitments. °Techniques for coping with stress include the following: °· Rethinking the problem. Try to think realistically about stressful events rather than ignoring them or overreacting. Try to find the positives in a stressful situation rather than focusing on the negatives. °· Exercise. Physical exercise can release both physical and emotional tension. The key is to find a form of exercise you enjoy and do it regularly. °· Relaxation techniques. These relax the body and mind. Examples include yoga, meditation, tai chi, biofeedback, deep   breathing, progressive muscle relaxation, listening to music, being out in nature, journaling, and other hobbies. Again, the key is to find one or more that you enjoy and can do  regularly.  Healthy lifestyle. Eat a balanced diet, get plenty of sleep, and do not smoke. Avoid using alcohol or drugs to relax.  Strong support network. Spend time with family, friends, or other people you enjoy being around.Express your feelings and talk things over with someone you trust. Counseling or talktherapy with a mental health professional may be helpful if you are having difficulty managing stress on your own. Medicine is typically not recommended for the treatment of stress.Talk to your health care provider if you think you need medicine for symptoms of stress. HOME CARE INSTRUCTIONS  Keep all follow-up visits as directed by your health care provider.  Take all medicines as directed by your health care provider. SEEK MEDICAL CARE IF:  Your symptoms get worse or you start having new symptoms.  You feel overwhelmed by your problems and can no longer manage them on your own. SEEK IMMEDIATE MEDICAL CARE IF:  You feel like hurting yourself or someone else.   This information is not intended to replace advice given to you by your health care provider. Make sure you discuss any questions you have with your health care provider.   Document Released: 12/09/2000 Document Revised: 07/06/2014 Document Reviewed: 02/07/2013 Elsevier Interactive Patient Education [employer].

## 2015-05-29 ENCOUNTER — Telehealth: Payer: Self-pay | Admitting: Family

## 2015-05-29 MED ORDER — TAMSULOSIN HCL 0.4 MG PO CAPS: 0.8000 mg | ORAL_CAPSULE | Freq: Every day | ORAL | Status: DC

## 2015-05-29 NOTE — Telephone Encounter (Signed)
Tresa EndoKelly, care provider on DPR, called request refill for Tamsulosin 0.4 mg [facility], pt is out of this med. He also [facility] about [facility] that he request for our office to fill out. Please give him call.   Phone # [phone]

## 2015-05-29 NOTE — Telephone Encounter (Signed)
Medication sent to pharmacy and paper work being completed.

## 2015-05-30 ENCOUNTER — Encounter: Payer: Self-pay | Admitting: Family

## 2015-05-30 NOTE — Telephone Encounter (Signed)
Kelly called back. Advised of the below.

## 2015-05-30 NOTE — Telephone Encounter (Signed)
LVM for [name] [name] to call back.

## 2015-05-30 NOTE — Telephone Encounter (Signed)
LVM letting Tresa EndoKelly know that FL2 form is ready

## 2015-06-13 ENCOUNTER — Other Ambulatory Visit: Payer: Self-pay | Admitting: Family

## 2015-07-04 ENCOUNTER — Telehealth: Payer: Self-pay | Admitting: Family

## 2015-07-04 MED ORDER — CLOPIDOGREL BISULFATE 75 MG PO TABS: 75.0000 mg | ORAL_TABLET | Freq: Every day | ORAL | Status: AC

## 2015-07-04 MED ORDER — BUSPIRONE HCL 15 MG PO TABS: 15.0000 mg | ORAL_TABLET | Freq: Three times a day (TID) | ORAL | Status: AC

## 2015-07-04 NOTE — Telephone Encounter (Signed)
[facility] [address] [facility] [place], [phone], called requesting refills for busPIRone (BUSPAR) 15 MG tablet [[id number] and clopidogrel (PLAVIX) 75 MG tablet [[id number]

## 2015-07-04 NOTE — Telephone Encounter (Signed)
Rxs sent. Tried to call pt but phone kept messing up.

## 2015-07-08 ENCOUNTER — Ambulatory Visit (HOSPITAL_COMMUNITY): Payer: Self-pay | Admitting: Psychiatry

## 2015-07-09 ENCOUNTER — Telehealth (HOSPITAL_COMMUNITY): Payer: Self-pay

## 2015-07-29 NOTE — Progress Notes (Signed)
HPI: 70 year old male for follow-up of coronary artery disease. Patient has had previous drug-eluting stent to his right coronary artery in [facility]. Cardiac catheterization in 2006 showed moderate coronary disease and medical therapy recommended. Repeat catheterization in September 2016 with similar results. Also with history of depression. Since last seen,   Cardiac Cath: 03/20/2015 1. Prox Cx to Mid Cx lesion, 20% stenosed. 2. 1st Mrg lesion, 55% stenosed. 3. Prox LAD to Mid LAD lesion, 75% stenosed. 4. Mid LAD lesion, 20% stenosed.  Widely patent right coronary stent.  Intermediate stenosis in the proximal to mid LAD, with antegrade appearance of 70-80% obstruction. FFR however was 0.85 suggesting hemodynamic insignificance  Irregularities noted in the mid circumflex. No significant obstruction is seen.  Overall normal LV function  Current Outpatient Prescriptions  Medication Sig Dispense Refill  . aspirin 81 MG EC tablet Take 1 tablet (81 mg total) by mouth daily. 30 tablet 12  . atorvastatin (LIPITOR) 40 MG tablet Take 1 tablet (40 mg total) by mouth daily. 90 tablet 3  . busPIRone (BUSPAR) 15 MG tablet Take 1 tablet (15 mg total) by mouth 3 (three) times daily. 90 tablet 1  . clopidogrel (PLAVIX) 75 MG tablet Take 1 tablet (75 mg total) by mouth daily. 30 tablet 11  . cyanocobalamin 1000 MCG tablet Take 0.5 tablets (500 mcg total) by mouth 2 (two) times daily. 30 tablet 6  . DULoxetine (CYMBALTA) 60 MG capsule TAKE ONE CAPSULE BY MOUTH TWICE A DAY 180 capsule 0  . finasteride (PROSCAR) 5 MG tablet Take 1 tablet (5 mg total) by mouth daily. 90 tablet 0  . lisinopril (PRINIVIL,ZESTRIL) 10 MG tablet Take 1 tablet (10 mg total) by mouth daily. 90 tablet 3  . metoprolol (LOPRESSOR) 50 MG tablet Take 1 tablet (50 mg total) by mouth 2 (two) times daily. 60 tablet 6  . nitroGLYCERIN (NITROSTAT) 0.4 MG SL tablet Place 1 tablet (0.4 mg total) under the tongue every 5 (five)  minutes as needed for chest pain. 25 tablet 3  . pantoprazole (PROTONIX) 40 MG tablet Take 1 tablet (40 mg total) by mouth daily at 12 noon. 30 tablet 11  . tamsulosin (FLOMAX) 0.4 MG CAPS capsule Take 2 capsules (0.8 mg total) by mouth daily. 30 capsule 2  . traZODone (DESYREL) 50 MG tablet Take 0.5 tablets (25 mg total) by mouth at bedtime and may repeat dose one time if needed. 30 tablet 3   No current facility-administered medications for this visit.     Past Medical History  Diagnosis Date  . Edema   . Dermatophytosis of groin and perianal area   . CAD (coronary artery disease)     cath '06, repeat '08 non-obstructive disease  . GERD (gastroesophageal reflux disease)   . Hyperlipidemia   . Allergy   . History of prostatitis   . History of BPH   . Hypertension   . Anxiety   . Depression   . Myocardial infarction [facility]) ~ 08/2014    Past Surgical History  Procedure Laterality Date  . Tonsillectomy and adenoidectomy    . Coronary angioplasty with stent placement  ~ 08/2014    "1 stent"  . Cardiac catheterization  05/07/2005   . Cardiac catheterization N/A 03/20/2015    Procedure: Left Heart Cath and Coronary Angiography;  Surgeon: Lyn Records, MD;  Location: [facility] INVASIVE CV LAB;  Service: Cardiovascular;  Laterality: N/A;    Social History   Social History  .  Marital Status: Divorced    Spouse Name: N/A  . Number of Children: 3  . Years of Education: PhD   Occupational History  . Retired     Social History Main Topics  . Smoking status: Former Smoker -- 12 years    Types: Cigars  . Smokeless tobacco: Never Used     Comment: quit smoking in 2010  . Alcohol Use: No     Comment: ocassionally  . Drug Use: No  . Sexual Activity:    Partners: Female   Other Topics Concern  . 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 Stroke Mother   . Coronary artery disease Father   . Heart attack Father   . Cancer Neg Hx     colon or prostate cancer    ROS: no fevers or chills, productive cough, hemoptysis, dysphasia, odynophagia, melena, hematochezia, dysuria, hematuria, rash, seizure activity, orthopnea, PND, pedal edema, claudication. Remaining systems are negative.  Physical Exam: Well-developed well-nourished in no acute distress.  Skin is warm and dry.  HEENT is normal.  Neck is supple.  Chest is clear to auscultation with normal expansion.  Cardiovascular exam is regular rate and rhythm.  Abdominal exam nontender or distended. No masses palpated. Extremities show no edema. neuro grossly intact  ECG     This encounter was created in error - please disregard.

## 2015-07-30 ENCOUNTER — Encounter: Payer: Self-pay | Admitting: Cardiology

## 2015-08-01 ENCOUNTER — Encounter: Payer: Self-pay | Admitting: *Deleted

## 2015-08-14 ENCOUNTER — Other Ambulatory Visit: Payer: Self-pay | Admitting: Family

## 2015-09-20 ENCOUNTER — Telehealth: Payer: Self-pay | Admitting: Family

## 2015-09-20 DIAGNOSIS — N400 Enlarged prostate without lower urinary tract symptoms: Secondary | ICD-10-CM

## 2015-09-20 MED ORDER — FINASTERIDE 5 MG PO TABS: 5.0000 mg | ORAL_TABLET | Freq: Every day | ORAL | Status: AC

## 2015-09-20 NOTE — Telephone Encounter (Signed)
Pt request refill for finasteride (PROSCAR) 5 MG tablet [facility] [place]. Please help, they are trying to get in tough with us for couple days now.

## 2015-09-20 NOTE — Telephone Encounter (Signed)
Rx faxed

## 2015-09-20 NOTE — Telephone Encounter (Signed)
Medication sent.

## 2016-04-16 IMAGING — CR DG CHEST 2V
2 series · 2 of 2 positions shown · non-contrast
Comparison: 03/19/2015 and 05/14/2013

CLINICAL DATA: Chest pain and nonproductive cough.

EXAM:
CHEST  2 VIEW

[chest pa]
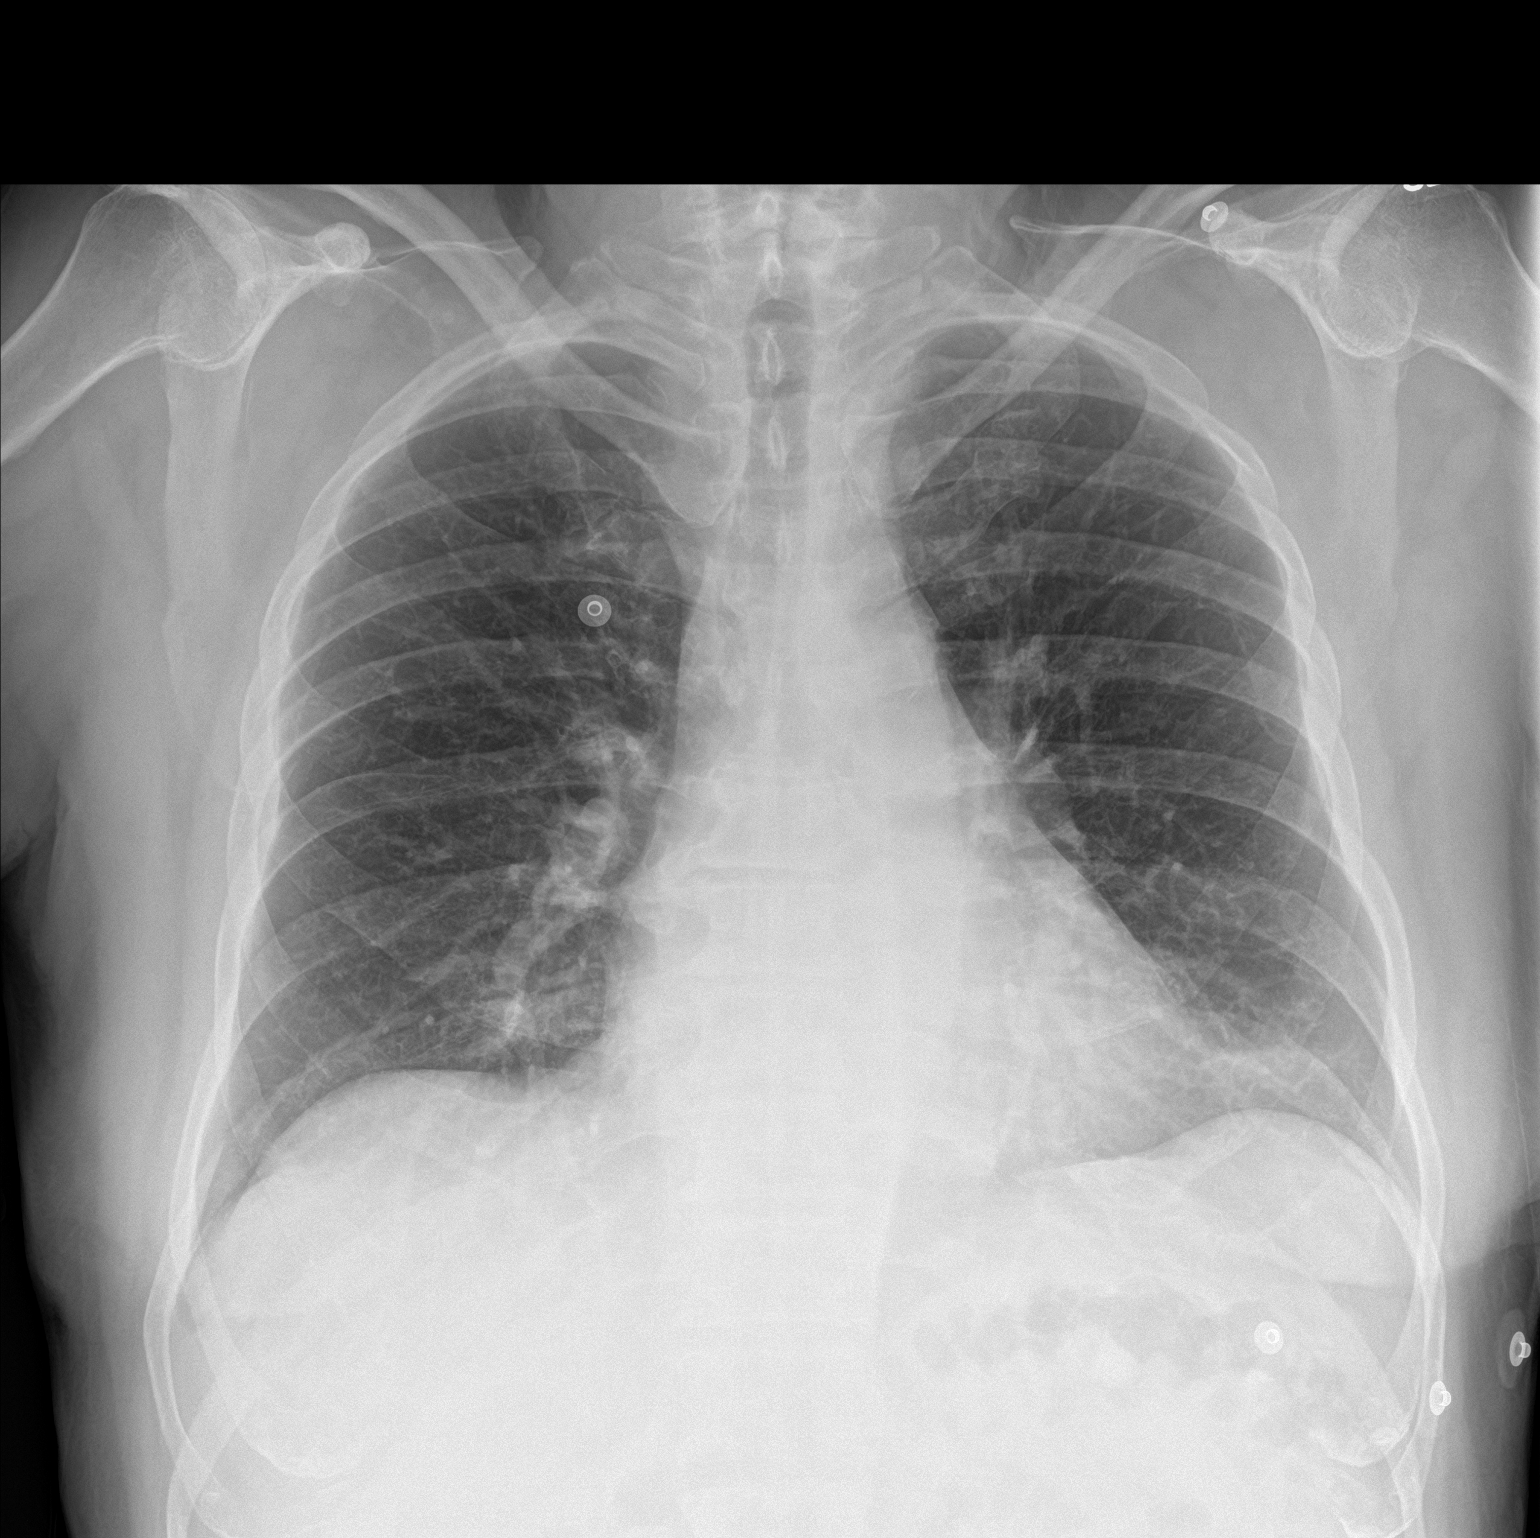

[chest lat]
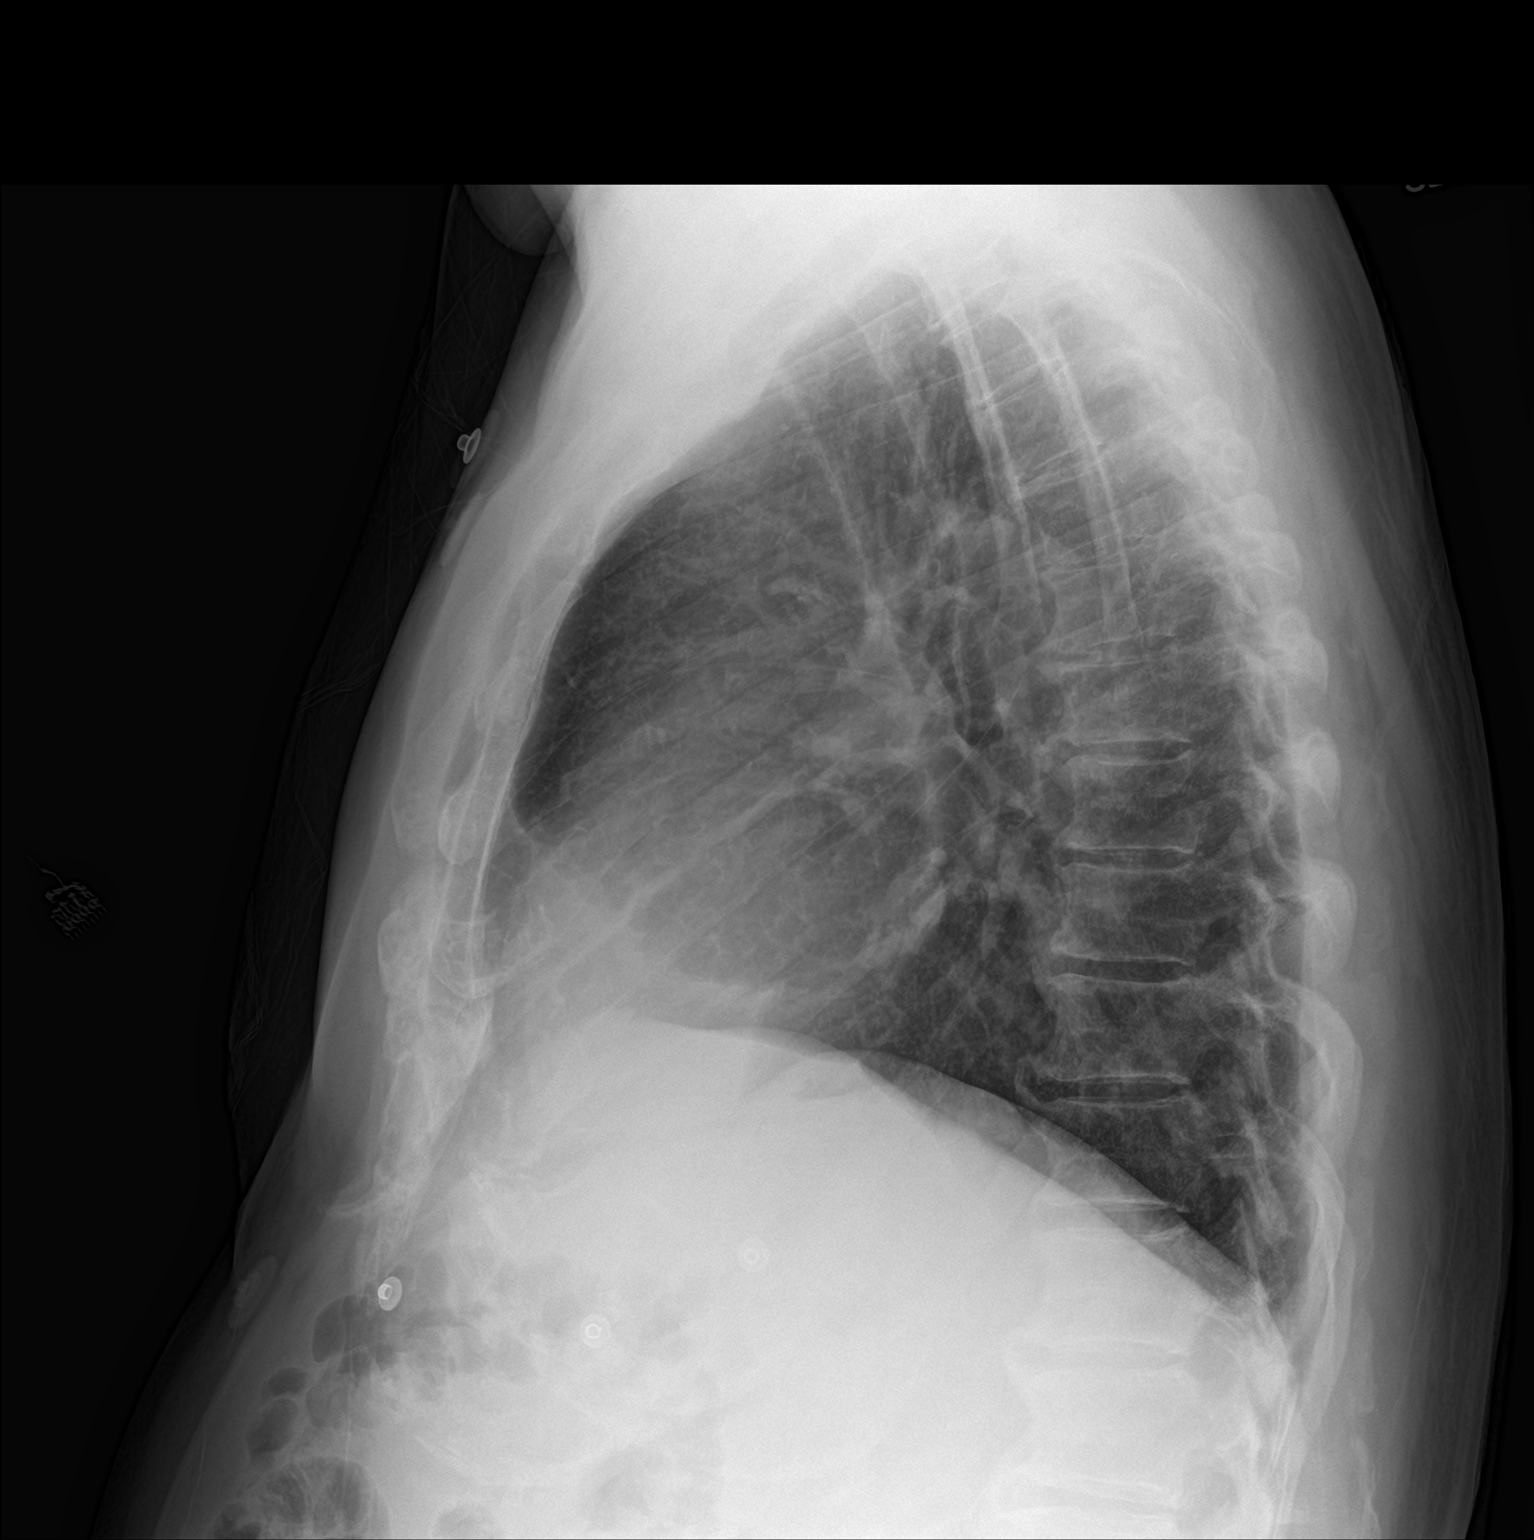

[2 of 2 positions shown; findings below may reference images not displayed]

FINDINGS: The heart size and mediastinal contours are within normal limits.
Both lungs are clear. The visualized skeletal structures are
unremarkable. Prominent left apical pericardial fat pad.
IMPRESSION: No active cardiopulmonary disease.

## 2019-04-29 ENCOUNTER — Telehealth: Payer: Self-pay | Admitting: Student

## 2019-04-29 NOTE — Telephone Encounter (Signed)
   Received page from Answering Service that patient was having "issues with heart." Called and spoke with patient. It was very difficult to get history from patient. He states he was seen in [place] yesterday for spasms in his chest and they told him he was okay. I cannot see any documentation of this in Care Everywhere. He states the pain was very bad and he thought he was going to die. He states he is having the pain again today and became very tearful on the phone. Patient lives alone so I advised him to call 911. He then asked if the "Jewish girl could call him back later" because he is so lonely. I am not sure if patient has any underlying memory issues but it was very hard to follow along with him on the phone. I again emphasized my recommendation that he call 911. Patient hung up abruptly.  Darreld Mclean, PA-C 04/29/2019 7:58 AM

## 2019-05-04 ENCOUNTER — Telehealth: Payer: Self-pay | Admitting: *Deleted

## 2019-05-04 NOTE — Telephone Encounter (Signed)
A message was left, re: his new patient appointment. 

## 2019-05-17 ENCOUNTER — Encounter: Payer: Self-pay | Admitting: General Practice
